# Patient Record
Sex: Male | Born: 1950 | Race: White | Hispanic: No | Marital: Married | Smoking: Never smoker
Health system: Southern US, Community
[De-identification: ages and names within clinical notes are randomized; demographics above are authoritative.]

## PROBLEM LIST (undated history)

## (undated) DIAGNOSIS — C412 Malignant neoplasm of vertebral column: Secondary | ICD-10-CM

## (undated) DIAGNOSIS — G4730 Sleep apnea, unspecified: Secondary | ICD-10-CM

## (undated) DIAGNOSIS — R7303 Prediabetes: Secondary | ICD-10-CM

## (undated) DIAGNOSIS — N189 Chronic kidney disease, unspecified: Secondary | ICD-10-CM

## (undated) DIAGNOSIS — C61 Malignant neoplasm of prostate: Secondary | ICD-10-CM

## (undated) HISTORY — PX: HERNIA REPAIR: SHX51

## (undated) HISTORY — DX: Chronic kidney disease, unspecified: N18.9

## (undated) HISTORY — PX: LITHOTRIPSY: SUR834

## (undated) HISTORY — DX: Prediabetes: R73.03

## (undated) HISTORY — PX: PROSTATE BIOPSY: SHX241

## (undated) HISTORY — DX: Malignant neoplasm of vertebral column: C41.2

---

## 1998-07-18 ENCOUNTER — Ambulatory Visit: Admission: RE | Admit: 1998-07-18 | Discharge: 1998-07-18 | Payer: Self-pay | Admitting: Emergency Medicine

## 2004-10-15 ENCOUNTER — Emergency Department (HOSPITAL_COMMUNITY): Admission: EM | Admit: 2004-10-15 | Discharge: 2004-10-15 | Payer: Self-pay | Admitting: Family Medicine

## 2009-02-22 ENCOUNTER — Ambulatory Visit: Payer: Self-pay | Admitting: Family Medicine

## 2009-11-21 ENCOUNTER — Ambulatory Visit: Payer: Self-pay | Admitting: Family Medicine

## 2009-12-08 ENCOUNTER — Ambulatory Visit: Payer: Self-pay | Admitting: Family Medicine

## 2009-12-19 ENCOUNTER — Ambulatory Visit (HOSPITAL_COMMUNITY): Admission: RE | Admit: 2009-12-19 | Discharge: 2009-12-19 | Payer: Self-pay | Admitting: Urology

## 2010-05-01 ENCOUNTER — Ambulatory Visit: Payer: Self-pay | Admitting: Family Medicine

## 2010-07-04 ENCOUNTER — Ambulatory Visit (INDEPENDENT_AMBULATORY_CARE_PROVIDER_SITE_OTHER): Payer: Medicare FFS | Admitting: Physician Assistant

## 2010-07-04 DIAGNOSIS — M25569 Pain in unspecified knee: Secondary | ICD-10-CM

## 2012-03-13 ENCOUNTER — Ambulatory Visit (INDEPENDENT_AMBULATORY_CARE_PROVIDER_SITE_OTHER): Payer: 59 | Admitting: Family Medicine

## 2012-03-13 ENCOUNTER — Ambulatory Visit
Admission: RE | Admit: 2012-03-13 | Discharge: 2012-03-13 | Disposition: A | Discharge status: Home / Self Care | Payer: 59 | Source: Ambulatory Visit | Attending: Family Medicine | Admitting: Family Medicine

## 2012-03-13 ENCOUNTER — Encounter: Payer: Self-pay | Admitting: Family Medicine

## 2012-03-13 VITALS — BP 112/78 | HR 72 | Wt 204.0 lb

## 2012-03-13 DIAGNOSIS — M25519 Pain in unspecified shoulder: Secondary | ICD-10-CM

## 2012-03-13 DIAGNOSIS — M25511 Pain in right shoulder: Secondary | ICD-10-CM

## 2012-03-13 DIAGNOSIS — Z0000 Encounter for general adult medical examination without abnormal findings: Secondary | ICD-10-CM

## 2012-03-13 LAB — CBC WITH DIFFERENTIAL/PLATELET
Basophils Absolute: 0 10*3/uL (ref 0.0–0.1)
Basophils Relative: 0 % (ref 0–1)
Eosinophils Absolute: 0 10*3/uL (ref 0.0–0.7)
Eosinophils Relative: 1 % (ref 0–5)
HCT: 45.3 % (ref 39.0–52.0)
Hemoglobin: 15.8 g/dL (ref 13.0–17.0)
Lymphocytes Relative: 35 % (ref 12–46)
Lymphs Abs: 1.7 10*3/uL (ref 0.7–4.0)
MCH: 31 pg (ref 26.0–34.0)
MCHC: 34.9 g/dL (ref 30.0–36.0)
MCV: 89 fL (ref 78.0–100.0)
Monocytes Absolute: 0.4 10*3/uL (ref 0.1–1.0)
Monocytes Relative: 8 % (ref 3–12)
Neutro Abs: 2.7 10*3/uL (ref 1.7–7.7)
Neutrophils Relative %: 56 % (ref 43–77)
Platelets: 247 10*3/uL (ref 150–400)
RBC: 5.09 MIL/uL (ref 4.22–5.81)
RDW: 14.2 % (ref 11.5–15.5)
WBC: 4.9 10*3/uL (ref 4.0–10.5)

## 2012-03-13 LAB — COMPREHENSIVE METABOLIC PANEL
ALT: 26 U/L (ref 0–53)
AST: 18 U/L (ref 0–37)
Albumin: 4.3 g/dL (ref 3.5–5.2)
Alkaline Phosphatase: 61 U/L (ref 39–117)
BUN: 14 mg/dL (ref 6–23)
CO2: 26 mEq/L (ref 19–32)
Calcium: 9.3 mg/dL (ref 8.4–10.5)
Chloride: 102 mEq/L (ref 96–112)
Creat: 0.81 mg/dL (ref 0.50–1.35)
Glucose, Bld: 99 mg/dL (ref 70–99)
Potassium: 4.3 mEq/L (ref 3.5–5.3)
Sodium: 139 mEq/L (ref 135–145)
Total Bilirubin: 0.6 mg/dL (ref 0.3–1.2)
Total Protein: 6.6 g/dL (ref 6.0–8.3)

## 2012-03-13 LAB — LIPID PANEL
Cholesterol: 220 mg/dL — ABNORMAL HIGH (ref 0–200)
HDL: 50 mg/dL (ref >39)
LDL Cholesterol: 152 mg/dL — ABNORMAL HIGH (ref 0–99)
Total CHOL/HDL Ratio: 4.4 Ratio
Triglycerides: 88 mg/dL (ref <150)
VLDL: 18 mg/dL (ref 0–40)

## 2012-03-13 MED ORDER — TRAMADOL HCL 50 MG PO TABS: 50.0000 mg | ORAL_TABLET | Freq: Four times a day (QID) | ORAL | Status: DC | PRN

## 2012-03-13 NOTE — Progress Notes (Signed)
  Subjective:    Patient ID: Wesley White, male    DOB: 1950-09-02, 61 y.o.   MRN: [id number]  HPI Approximately 9 days ago while hiking, he fell landing on his right elbow with feeling a crack in his right shoulder and he has had pain since then. No numbness or tingling but definitely pain with motion. He has tried tramadol which has helped with the discomfort. He would also like blood work drawn prior to his complete exam which will be in the near future.   Review of Systems     Objective:   Physical Exam Good motion of the shoulder joint with pain on abduction however passive motion causes very low difficulty. Does have tenderness palpation over the distal clavicle and a.c. area. Normal strength is noted. X-rays of the shoulder and clavicle were negative       Assessment & Plan:  Probable right shoulder a.c. strain. Routine blood work was drawn. Recommend conservative care for this. I recommend that he take 2 tramadol and if he needs a refill, call me

## 2012-03-13 NOTE — Patient Instructions (Signed)
Take 2 tramadol at a time and call when you need a refill

## 2012-03-17 ENCOUNTER — Ambulatory Visit (INDEPENDENT_AMBULATORY_CARE_PROVIDER_SITE_OTHER): Payer: 59 | Admitting: Family Medicine

## 2012-03-17 ENCOUNTER — Encounter: Payer: Self-pay | Admitting: Family Medicine

## 2012-03-17 VITALS — BP 122/84 | HR 84 | Ht 68.0 in | Wt 207.0 lb

## 2012-03-17 DIAGNOSIS — M25519 Pain in unspecified shoulder: Secondary | ICD-10-CM

## 2012-03-17 DIAGNOSIS — Z0000 Encounter for general adult medical examination without abnormal findings: Secondary | ICD-10-CM

## 2012-03-17 DIAGNOSIS — E669 Obesity, unspecified: Secondary | ICD-10-CM

## 2012-03-17 DIAGNOSIS — Z23 Encounter for immunization: Secondary | ICD-10-CM

## 2012-03-17 DIAGNOSIS — M25511 Pain in right shoulder: Secondary | ICD-10-CM

## 2012-03-17 DIAGNOSIS — Z8042 Family history of malignant neoplasm of prostate: Secondary | ICD-10-CM

## 2012-03-17 MED ORDER — TRAMADOL HCL 50 MG PO TABS: 50.0000 mg | ORAL_TABLET | Freq: Four times a day (QID) | ORAL | Status: DC | PRN

## 2012-03-17 NOTE — Progress Notes (Signed)
  Subjective:    Patient ID: Wesley White, male    DOB: Feb 11, 1951, 61 y.o.   MRN: [id number]  HPI He is here for complete examination. He still is having some difficulty with right shoulder pain and would like a renewal of his pain medication. He has no other concerns or complaints. Review of his past medical history is essentially negative. Social and family history were reviewed. His marriage and work are going quite well. He does need followup on immunizations as well as colonoscopy. Family history is significant for brother prior to age 103 having prostate cancer and it sounds as if he had the robotic surgery.   Review of Systems  Constitutional: Negative.   HENT: Negative.   Eyes: Negative.   Respiratory: Negative.   Cardiovascular: Negative.   Gastrointestinal: Negative.   Genitourinary: Negative.   Musculoskeletal: Negative.   Skin: Negative.   Neurological: Negative.   Hematological: Negative.   Psychiatric/Behavioral: Negative.        Objective:   Physical Exam BP 122/84  Pulse 84  Ht 5\' 8"  (1.727 m)  Wt 207 lb (93.895 kg)  BMI 31.47 kg/m2  General Appearance:    Alert, cooperative, no distress, appears stated age  Head:    Normocephalic, without obvious abnormality, atraumatic  Eyes:    PERRL, conjunctiva/corneas clear, EOM's intact, fundi    benign  Ears:    Normal TM's and external ear canals  Nose:   Nares normal, mucosa normal, no drainage or sinus   tenderness  Throat:   Lips, mucosa, and tongue normal; teeth and gums normal  Neck:   Supple, no lymphadenopathy;  thyroid:  no   enlargement/tenderness/nodules; no carotid   bruit or JVD  Back:    Spine nontender, no curvature, ROM normal, no CVA     tenderness  Lungs:     Clear to auscultation bilaterally without wheezes, rales or     ronchi; respirations unlabored  Chest Wall:    No tenderness or deformity   Heart:    Regular rate and rhythm, S1 and S2 normal, no murmur, rub   or gallop  Breast Exam:    No  chest wall tenderness, masses or gynecomastia  Abdomen:     Soft, non-tender, nondistended, normoactive bowel sounds,    no masses, no hepatosplenomegaly  Genitalia:   normal penis and testes   Rectal:   deferred   Extremities:   No clubbing, cyanosis or edema  Pulses:   2+ and symmetric all extremities  Skin:   Skin color, texture, turgor normal, no rashes or lesions  Lymph nodes:   Cervical, supraclavicular, and axillary nodes normal  Neurologic:   CNII-XII intact, normal strength, sensation and gait; reflexes 2+ and symmetric throughout          Psych:   Normal mood, affect, hygiene and grooming.      Assessment & Plan:   1. Right shoulder pain  traMADol (ULTRAM) 50 MG tablet  2. Routine general medical examination at a health care facility  Tdap vaccine greater than or equal to 7yo IM, Varicella-zoster vaccine subcutaneous, HM COLONOSCOPY, PSA, Medicare  3. Family history of prostate cancer    4. Obesity (BMI 30.0-34.9)     discussed weight reduction with him in regard to decreasing carbohydrates also mentioned decreasing his waist size by 2-3 inches.

## 2012-03-17 NOTE — Patient Instructions (Signed)
Decrease carbohydrates by cutting back on "white food".

## 2012-03-18 LAB — PSA, MEDICARE: PSA: 1.64 ng/mL (ref <=4.00)

## 2012-03-18 NOTE — Progress Notes (Signed)
Quick Note:  The blood work is normal ______ 

## 2012-03-18 NOTE — Progress Notes (Signed)
Quick Note:  CALLED PT CELL # PT INFORMED LABS ARE NORMAL PT VERBALIZED UNDERSTANDING  ______

## 2012-04-03 ENCOUNTER — Ambulatory Visit (AMBULATORY_SURGERY_CENTER): Payer: 59 | Admitting: *Deleted

## 2012-04-03 VITALS — Ht 70.0 in | Wt 204.7 lb

## 2012-04-03 DIAGNOSIS — Z1211 Encounter for screening for malignant neoplasm of colon: Secondary | ICD-10-CM

## 2012-04-03 MED ORDER — PEG-KCL-NACL-NASULF-NA ASC-C 100 G PO SOLR: ORAL | Status: DC

## 2012-04-03 NOTE — Progress Notes (Signed)
No allergies to eggs or soy products 

## 2012-04-17 ENCOUNTER — Encounter: Payer: Self-pay | Admitting: Internal Medicine

## 2012-04-17 ENCOUNTER — Ambulatory Visit (AMBULATORY_SURGERY_CENTER): Payer: 59 | Admitting: Internal Medicine

## 2012-04-17 VITALS — BP 137/93 | HR 66 | Temp 97.4°F | Resp 15 | Ht 70.0 in | Wt 204.0 lb

## 2012-04-17 DIAGNOSIS — Z1211 Encounter for screening for malignant neoplasm of colon: Secondary | ICD-10-CM

## 2012-04-17 DIAGNOSIS — D126 Benign neoplasm of colon, unspecified: Secondary | ICD-10-CM

## 2012-04-17 MED ORDER — FLEET ENEMA 7-19 GM/118ML RE ENEM
1.0000 | ENEMA | Freq: Once | RECTAL | Status: AC
  Administered 2012-04-17: 1 via RECTAL

## 2012-04-17 MED ORDER — SODIUM CHLORIDE 0.9 % IV SOLN: 500.0000 mL | INTRAVENOUS | Status: DC

## 2012-04-17 NOTE — Progress Notes (Signed)
PATIENT REPORTING BROWN RESULTS WITH LAST BOWEL MOVEMENT. INFORMED DR. PYRTLE, FLEETS ORDERED. INFORMED PATIENT, WHO RESPONDED, "YOU HAVE GOT TO BE SHITTING ME." INSTRUCTED PATIENT AND OFFERED TO ADMINISTER THE ENEMA, PATIENT SELF ADMINISTERED, AND RETURNED TO BEDSPACE IN LESS THAN 5 MINUTES. SUZANNE HODGES RN REPORTING THAT THE RESULTS WERE CLOUDY WITH PARTICLES, NO LONGER BROWN IN COLOR. INFORMED THE PATIENT THAT HE MAY NEED TO GO BACK TO BATHROOM SHORTLY FROM RESULTS OF ENEMA. PATIENT RESPONDED"OR MAYBE I WILL JUST GO HOME."QUESTIONED PATIENT HE THEN STATED THE PREP HAD BEEN DIFFICULT LAST EVENING. PATIENT STATING HE REALLY DIDN'T KNOW WHY HE WAS HERE. DISCUSSED SCREENING COLONOSCOPY. PATIENT THEN BECAME CALM AND COOPERATIVE WITH ADMITTING PROCESS

## 2012-04-17 NOTE — Progress Notes (Addendum)
Patient swearing at me due to the fact that I asked him to pass gas.    He continues to tell his wife that " there isn't any point in trying to pass anything if there isn't anything there in the first place."  He continues to be argumentative.  After discharge instructions, patient continued to argue the fact about gas, and stated that "he is fine.Marland KitchenMarland Kitchenperiod."     Patient refused to pass gas, and is no on his back and refuses to be on his L side.  He continues to refuse our instructions and tells his wife that "the instructions are stupid."    Patient states that "their expectations are ridiculous."  Patient refused to sit in wheelchair until another nurse told him that he had to or else he couldn't leave.  He sat in the chair, but then got up to put on his coat.  We will not be legally held for him if he falls.   He WILL NOT FOLLOW OR DIRECTIONS.      Patient did not have preoperative order for IV antibiotic SSI prophylaxis. [phone])  Patient did not experience any of the following events: a burn prior to discharge; a fall within the facility; wrong site/side/patient/procedure/implant event; or a hospital transfer or hospital admission upon discharge from the facility. [phone])    .

## 2012-04-17 NOTE — Patient Instructions (Addendum)
YOU HAD AN ENDOSCOPIC PROCEDURE TODAY AT THE [facility]: Refer to the procedure report that was given to you for any specific questions about what was found during the examination.  If the procedure report does not answer your questions, please call your gastroenterologist to clarify.  If you requested that your care partner not be given the details of your procedure findings, then the procedure report has been included in a sealed envelope for you to review at your convenience later.  YOU SHOULD EXPECT: Some feelings of bloating in the abdomen. Passage of more gas than usual.  Walking can help get rid of the air that was put into your GI tract during the procedure and reduce the bloating. If you had a lower endoscopy (such as a colonoscopy or flexible sigmoidoscopy) you may notice spotting of blood in your stool or on the toilet paper. If you underwent a bowel prep for your procedure, then you may not have a normal bowel movement for a few days.  DIET: Your first meal following the procedure should be a light meal and then it is ok to progress to your normal diet.  A half-sandwich or bowl of soup is an example of a good first meal.  Heavy or fried foods are harder to digest and may make you feel nauseous or bloated.  Likewise meals heavy in dairy and vegetables can cause extra gas to form and this can also increase the bloating.  Drink plenty of fluids but you should avoid alcoholic beverages for 24 hours.  ACTIVITY: Your care partner should take you home directly after the procedure.  You should plan to take it easy, moving slowly for the rest of the day.  You can resume normal activity the day after the procedure however you should NOT DRIVE or use heavy machinery for 24 hours (because of the sedation medicines used during the test).    SYMPTOMS TO REPORT IMMEDIATELY: A gastroenterologist can be reached at any hour.  During normal business hours, 8:30 AM to 5:00 PM Monday through Friday,  call [phone].  After hours and on weekends, please call the GI answering service at [phone] who will take a message and have the physician on call contact you.   Following lower endoscopy (colonoscopy or flexible sigmoidoscopy):  Excessive amounts of blood in the stool  Significant tenderness or worsening of abdominal pains  Swelling of the abdomen that is new, acute  Fever of 100F or higher    FOLLOW UP: If any biopsies were taken you will be contacted by phone or by letter within the next 1-3 weeks.  Call your gastroenterologist if you have not heard about the biopsies in 3 weeks.  Our staff will call the home number listed on your records the next business day following your procedure to check on you and address any questions or concerns that you may have at that time regarding the information given to you following your procedure. This is a courtesy call and so if there is no answer at the home number and we have not heard from you through the emergency physician on call, we will assume that you have returned to your regular daily activities without incident.  SIGNATURES/CONFIDENTIALITY: You and/or your care partner have signed paperwork which will be entered into your electronic medical record.  These signatures attest to the fact that that the information above on your After Visit Summary has been reviewed and is understood.  Full responsibility of the confidentiality   of this discharge information lies with you and/or your care-partner.     

## 2012-04-17 NOTE — Op Note (Signed)
[facility] [address] [place], [address]   COLONOSCOPY PROCEDURE REPORT  PATIENT: Wesley White, Wesley White  MR#: [id number] BIRTHDATE: 02-05-1951 , 61  yrs. old GENDER: Male ENDOSCOPIST: Beverley Fiedler, MD REFERRED [id number], John PROCEDURE DATE:  04/17/2012 PROCEDURE:   Colonoscopy with biopsy ASA CLASS:   Class II INDICATIONS:average risk screening and first colonoscopy. MEDICATIONS: MAC sedation, administered by CRNA and propofol (Diprivan) 300mg  IV  DESCRIPTION OF PROCEDURE:   After the risks benefits and alternatives of the procedure were thoroughly explained, informed consent was obtained.  A digital rectal exam revealed no rectal mass.   The LB CF-[id number]  endoscope was introduced through the anus and advanced to the terminal ileum which was intubated for a short distance. No adverse events experienced.   The quality of the prep was Moviprep fair  The instrument was then slowly withdrawn as the colon was fully examined.    COLON FINDINGS: The mucosa appeared normal in the terminal ileum. Mild erythema was found at the ileocecal valve on the cecal side raising the question of adenomatous change.  Multiple biopsies of the area were performed using cold forceps.   The preparation was fair requiring copious irrigation and lavage, and the colon mucosa was otherwise normal.  Retroflexed views revealed no abnormalities. The time to cecum=4 minutes 48 seconds.  Withdrawal time=11 minutes 12 seconds.  The scope was withdrawn and the procedure completed.  COMPLICATIONS: There were no complications.  ENDOSCOPIC IMPRESSION: 1.   Normal mucosa in the terminal ileum 2.   Mild erythema was found at the ileocecal valve; multiple biopsies of the area were performed using cold forceps 3.   The colon mucosa was otherwise normal  RECOMMENDATIONS: 1.  Await biopsy results 2.  Timing of repeat colonoscopy will be determined by pathology findings. 3.  You will  receive a letter within 1-2 weeks with the results of your biopsy as well as final recommendations.  Please call my office if you have not received a letter after 3 weeks.   eSigned:  Beverley Fiedler, MD 04/17/2012 12:19 PM  cc: Sharlot Gowda, MD and The Patient

## 2012-04-17 NOTE — Progress Notes (Signed)
The pt tolerated the colonoscopy very well. Maw   

## 2012-04-18 ENCOUNTER — Telehealth: Payer: Self-pay | Admitting: *Deleted

## 2012-04-18 NOTE — Telephone Encounter (Signed)
  Follow up Call-  Call back number 04/17/2012  Post procedure Call Back phone  # [phone]  Permission to leave phone message Yes     Patient questions:  Do you have a fever, pain , or abdominal swelling? no Pain Score  0 *  Have you tolerated food without any problems? yes  Have you been able to return to your normal activities? yes  Do you have any questions about your discharge instructions: Diet   no Medications  no Follow up visit  no  Do you have questions or concerns about your Care? no  Actions: * If pain score is 4 or above: No action needed, pain <4.

## 2012-04-23 ENCOUNTER — Encounter: Payer: Self-pay | Admitting: Internal Medicine

## 2013-05-19 ENCOUNTER — Ambulatory Visit (INDEPENDENT_AMBULATORY_CARE_PROVIDER_SITE_OTHER): Payer: 59 | Admitting: Family Medicine

## 2013-05-19 ENCOUNTER — Encounter: Payer: Self-pay | Admitting: Family Medicine

## 2013-05-19 VITALS — BP 120/74 | HR 100 | Temp 98.6°F | Wt 203.0 lb

## 2013-05-19 DIAGNOSIS — J069 Acute upper respiratory infection, unspecified: Secondary | ICD-10-CM

## 2013-05-19 NOTE — Patient Instructions (Signed)

## 2013-05-19 NOTE — Progress Notes (Signed)
   Subjective:    Patient ID: Wesley White, male    DOB: [birth year], 62 y.o.   MRN: [id number]  HPI 4 days ago he started having difficulty with malaise, nasal congestion followed by chest congestion and cough and slight fever. No sore throat or earache. He does not smoke and has no allergies.   Review of Systems     Objective:   Physical Exam alert and in no distress. Tympanic membranes and canals are normal. Throat is clear. Tonsils are normal. Neck is supple without adenopathy or thyromegaly. Cardiac exam shows a regular sinus rhythm without murmurs or gallops. Lungs are clear to auscultation.        Assessment & Plan:  Acute URI  recommend supportive care.

## 2014-10-26 ENCOUNTER — Ambulatory Visit (INDEPENDENT_AMBULATORY_CARE_PROVIDER_SITE_OTHER): Payer: 59 | Admitting: Family Medicine

## 2014-10-26 VITALS — BP 132/84 | HR 115 | Temp 99.9°F | Wt 223.0 lb

## 2014-10-26 DIAGNOSIS — M79661 Pain in right lower leg: Secondary | ICD-10-CM | POA: Diagnosis not present

## 2014-10-26 DIAGNOSIS — J069 Acute upper respiratory infection, unspecified: Secondary | ICD-10-CM | POA: Diagnosis not present

## 2014-10-26 NOTE — Progress Notes (Signed)
   Subjective:    Patient ID: Wesley White, male    DOB: [birth year], 64 y.o.   MRN: [id number]  HPI He complains of a 4 day history this started with rhinorrhea, coughs, sore throat and now hoarse voice. No fever, chills, earache, shortness of breath. He does have some slight chest discomfort when he coughs. He also complains of a 4 day history of right lateral calf pain. No history of injury to it is been no shortness of breath, chest pain, history of sitting for long periods of time. He states that the leg pain is now much better.   Review of Systems     Objective:   Physical Exam Alert and in no distress. Tympanic membranes and canals are normal. Pharyngeal area is normal. Neck is supple without adenopathy or thyromegaly. Cardiac exam shows a regular sinus rhythm without murmurs or gallops. Lungs are clear to auscultation.Exam of the right calf shows a negative Homans sign. No visible lesions. No palpable tenderness or lesions. Normal skin.        Assessment & Plan:  Right calf pain  Acute URI Recommend supportive care for the URI. Explained that the calf pain since it is gone, no further intervention necessary.

## 2014-11-24 ENCOUNTER — Telehealth: Payer: Self-pay | Admitting: Internal Medicine

## 2014-11-24 MED ORDER — AMOXICILLIN 875 MG PO TABS: 875.0000 mg | ORAL_TABLET | Freq: Two times a day (BID) | ORAL | Status: DC

## 2014-11-24 NOTE — Telephone Encounter (Signed)
Pt wife called stating that pt is coughing up green stuff still. He was seen here a few weeks ago. He is having a sore throat as well. No fever, no energy. Can you send something in to [facility] or does he need to be seen

## 2014-11-24 NOTE — Telephone Encounter (Signed)
Let them know that I called and antibody can. If still having difficulty when he finishes anabiotic, have him make an appointment

## 2014-11-24 NOTE — Telephone Encounter (Signed)
Pt informed and verbalized understanding

## 2015-04-04 ENCOUNTER — Encounter: Payer: Self-pay | Admitting: Family Medicine

## 2015-04-04 ENCOUNTER — Ambulatory Visit (INDEPENDENT_AMBULATORY_CARE_PROVIDER_SITE_OTHER): Payer: 59 | Admitting: Family Medicine

## 2015-04-04 ENCOUNTER — Telehealth: Payer: Self-pay | Admitting: Internal Medicine

## 2015-04-04 VITALS — BP 118/76 | HR 88 | Temp 98.3°F | Wt 220.6 lb

## 2015-04-04 DIAGNOSIS — H109 Unspecified conjunctivitis: Secondary | ICD-10-CM

## 2015-04-04 DIAGNOSIS — G4733 Obstructive sleep apnea (adult) (pediatric): Secondary | ICD-10-CM | POA: Diagnosis not present

## 2015-04-04 DIAGNOSIS — J069 Acute upper respiratory infection, unspecified: Secondary | ICD-10-CM | POA: Diagnosis not present

## 2015-04-04 DIAGNOSIS — Z9989 Dependence on other enabling machines and devices: Secondary | ICD-10-CM | POA: Insufficient documentation

## 2015-04-04 MED ORDER — BUDESONIDE 32 MCG/ACT NA SUSP: 2.0000 | Freq: Every day | NASAL | Status: DC

## 2015-04-04 MED ORDER — ERYTHROMYCIN 5 MG/GM OP OINT: 1.0000 "application " | TOPICAL_OINTMENT | Freq: Three times a day (TID) | OPHTHALMIC | Status: DC

## 2015-04-04 NOTE — Telephone Encounter (Signed)
Pt was seen today and asked about getting some cpap supplies. I can not find sleep study in chart and have called [facility] and they do not have record of pt there either. I have called [facility] and spoke to dwayne and they have an overnight oximetry that they are faxing over and i will see if i can fax that over with rx for cpap supplies. (hose,tubing, and mask).  Phone # [phone] Fax # [phone]

## 2015-04-04 NOTE — Telephone Encounter (Signed)
[facility]. So i have faxed over rx and sleep study for pt to get some supplies

## 2015-04-04 NOTE — Progress Notes (Signed)
Subjective:    Patient ID: Wesley White, male    DOB: [birth year], 64 y.o.   MRN: [id number]  HPI Chief Complaint  Patient presents with  . URI    upper respirtory issue. cough, eyes matting shut. sore throat since last tuesday. some symptoms have gotten better   . sleep supplies    needs hose, mask, tubing through [facility].    He is here for a 3 day history of bilateral eyes being matted together with green discharge when he wakes up and having red eyes with clear drainage throughout the day. Reports some blurred vision with drainage but no changes otherwise. Denies foreign body sensation or pain to eyes. He wears eyeglasses.  States last week he had been achy with mild sore throat, ears and and nose itching and mild cough and states those symptoms are much improved. Denies history of lung disease, does not smoke, no known sick contacts.  He is also requesting that we help him order new supplies for his CPAP.  He states he has not received new supplies and 3 and half years.  Reports nightly use without any difficulties. Denies fever, chills, headache, dizziness, chest pain, shortness of breath, GI or GU symptoms.   Reviewed allergies, medications , past medical , and social history.   Review of Systems Pertinent positives and negatives in the history of present illness.    Objective:   Physical Exam  Constitutional: He appears well-developed and well-nourished. No distress.  HENT:  Right Ear: Tympanic membrane and ear canal normal.  Left Ear: Tympanic membrane and ear canal normal.  Nose: Nose normal. Right sinus exhibits no maxillary sinus tenderness and no frontal sinus tenderness. Left sinus exhibits no maxillary sinus tenderness and no frontal sinus tenderness.  Mouth/Throat: Uvula is midline and mucous membranes are normal. Posterior oropharyngeal erythema present. No oropharyngeal exudate or posterior oropharyngeal edema.  Eyes: EOM and lids are normal. Pupils are equal, round, and  reactive to light.  Bilateral conjunctiva with inflammation and clear drainage  Neck: Normal range of motion and full passive range of motion without pain. Neck supple.  Cardiovascular: Normal rate, regular rhythm and normal heart sounds.   Pulmonary/Chest: Effort normal and breath sounds normal. He has no wheezes. He has no rhonchi.  Lymphadenopathy:    He has no cervical adenopathy.       Right: No supraclavicular adenopathy present.       Left: No supraclavicular adenopathy present.  Skin: Skin is warm and dry. No cyanosis. No pallor. Nails show no clubbing.  BP 118/76 mmHg  Pulse 88  Temp(Src) 98.3 F (36.8 C) (Oral)  Wt 220 lb 9.6 oz (100.064 kg)  SpO2 98%   Visual acuity: normal    Assessment & Plan:  Bilateral conjunctivitis - Plan: erythromycin (ROMYCIN) ophthalmic ointment  OSA on CPAP  Viral URI  Viral URI- Discussed that he should continue symptomatic treatment for upper respiratory symptoms and that this is most likely viral and that he may have some underlying allergies. Samples of Rhinocort and Zyrtec given along with instructions for use.  He will let me know if this is not helping.  Bilateral conjunctivitis- Suspect bacterial conjunctivitis and will treat with antibiotic. Instructions given on how to use ophthalmic ointment and on using good hygiene to prevent spread of infection to others.    Sleep Apnea- He has a history of obstructive sleep apnea and uses CPAP nightly.  He is requesting new supplies for his machine and Sabrina, CMA,  is trying to get his previous sleep study results in order to get new supplies for him. Records show that his previous sleep study was at [facility] in 2000. She is working with [employer] in order to get his new supplies.

## 2015-04-06 ENCOUNTER — Telehealth: Payer: Self-pay | Admitting: Family Medicine

## 2015-04-06 NOTE — Telephone Encounter (Signed)
Please let him know that he can use Clear Eyes or something similar during the day and use the antibiotic in the evening.

## 2015-04-06 NOTE — Telephone Encounter (Signed)
Please call  Re: ointment for eye, it is helping but he can use during the days because it makes his vision blurry and interferes with him driving, Can he change to eye drops?  [facility] [address]   Please call

## 2015-04-06 NOTE — Telephone Encounter (Signed)
Patient notified

## 2015-05-27 ENCOUNTER — Other Ambulatory Visit: Payer: Self-pay | Admitting: Family Medicine

## 2015-05-27 ENCOUNTER — Encounter: Payer: Self-pay | Admitting: Family Medicine

## 2015-05-27 ENCOUNTER — Ambulatory Visit (INDEPENDENT_AMBULATORY_CARE_PROVIDER_SITE_OTHER): Payer: 59 | Admitting: Family Medicine

## 2015-05-27 VITALS — BP 130/78 | HR 64 | Temp 98.5°F | Wt 223.4 lb

## 2015-05-27 DIAGNOSIS — Z87442 Personal history of urinary calculi: Secondary | ICD-10-CM | POA: Diagnosis not present

## 2015-05-27 DIAGNOSIS — R109 Unspecified abdominal pain: Secondary | ICD-10-CM | POA: Diagnosis not present

## 2015-05-27 DIAGNOSIS — R7303 Prediabetes: Secondary | ICD-10-CM

## 2015-05-27 DIAGNOSIS — R81 Glycosuria: Secondary | ICD-10-CM | POA: Diagnosis not present

## 2015-05-27 DIAGNOSIS — S99912A Unspecified injury of left ankle, initial encounter: Secondary | ICD-10-CM

## 2015-05-27 LAB — POCT URINALYSIS DIPSTICK
Bilirubin, UA: NEGATIVE
Blood, UA: NEGATIVE
Ketones, UA: NEGATIVE
Leukocytes, UA: NEGATIVE
Nitrite, UA: NEGATIVE
Protein, UA: NEGATIVE
Spec Grav, UA: >=1.03
Urobilinogen, UA: NEGATIVE
pH, UA: 5.5

## 2015-05-27 LAB — POCT GLYCOSYLATED HEMOGLOBIN (HGB A1C): Hemoglobin A1C: 6

## 2015-05-27 MED ORDER — IBUPROFEN 800 MG PO TABS: 800.0000 mg | ORAL_TABLET | Freq: Three times a day (TID) | ORAL | Status: DC | PRN

## 2015-05-27 NOTE — Patient Instructions (Addendum)
Try taking 800 mg ibuprofen 3 times a day with food for the next 10 days and see if this helps with your side pain.  Prediabetes Eating Plan Prediabetes--also called impaired glucose tolerance or impaired fasting glucose--is a condition that causes blood sugar (blood glucose) levels to be higher than normal. Following a healthy diet can help to keep prediabetes under control. It can also help to lower the risk of type 2 diabetes and heart disease, which are increased in people who have prediabetes. Along with regular exercise, a healthy diet:  Promotes weight loss.  Helps to control blood sugar levels.  Helps to improve the way that the body uses insulin. WHAT DO I NEED TO KNOW ABOUT THIS EATING PLAN?  Use the glycemic index (GI) to plan your meals. The index tells you how quickly a food will raise your blood sugar. Choose low-GI foods. These foods take a longer time to raise blood sugar.  Pay close attention to the amount of carbohydrates in the food that you eat. Carbohydrates increase blood sugar levels.  Keep track of how many calories you take in. Eating the right amount of calories will help you to achieve a healthy weight. Losing about 7 percent of your starting weight can help to prevent type 2 diabetes.  You may want to follow a Mediterranean diet. This diet includes a lot of vegetables, lean meats or fish, whole grains, fruits, and healthy oils and fats. WHAT FOODS CAN I EAT? Grains Whole grains, such as whole-wheat or whole-grain breads, crackers, cereals, and pasta. Unsweetened oatmeal. Bulgur. Barley. Quinoa. Brown rice. Corn or whole-wheat flour tortillas or taco shells. Vegetables Lettuce. Spinach. Peas. Beets. Cauliflower. Cabbage. Broccoli. Carrots. Tomatoes. Squash. Eggplant. Herbs. Peppers. Onions. Cucumbers. Brussels sprouts. Fruits Berries. Bananas. Apples. Oranges. Grapes. Papaya. Mango. Pomegranate. Kiwi. Grapefruit. Cherries. Meats and Other Protein Sources Seafood.  Lean meats, such as chicken and [place] or lean cuts of pork and beef. Tofu. Eggs. Nuts. Beans. Dairy Low-fat or fat-free dairy products, such as yogurt, cottage cheese, and cheese. Beverages Water. Tea. Coffee. Sugar-free or diet soda. Seltzer water. Milk. Milk alternatives, such as soy or almond milk. Condiments Mustard. Relish. Low-fat, low-sugar ketchup. Low-fat, low-sugar barbecue sauce. Low-fat or fat-free mayonnaise. Sweets and Desserts Sugar-free or low-fat pudding. Sugar-free or low-fat ice cream and other frozen treats. Fats and Oils Avocado. Walnuts. Olive oil. The items listed above may not be a complete list of recommended foods or beverages. Contact your dietitian for more options.  WHAT FOODS ARE NOT RECOMMENDED? Grains Refined white flour and flour products, such as bread, pasta, snack foods, and cereals. Beverages Sweetened drinks, such as sweet iced tea and soda. Sweets and Desserts Baked goods, such as cake, cupcakes, pastries, cookies, and cheesecake. The items listed above may not be a complete list of foods and beverages to avoid. Contact your dietitian for more information.   This information is not intended to replace advice given to you by your health care provider. Make sure you discuss any questions you have with your health care provider.   Document Released: 09/28/2014 Document Reviewed: 09/28/2014 Elsevier Interactive Patient Education [employer].

## 2015-05-27 NOTE — Progress Notes (Signed)
   Subjective:    Patient ID: Wesley White, male    DOB: 1950/06/26, 64 y.o.   MRN: [id number]  HPI Chief Complaint  Patient presents with  . abdominal pain    abdominal pain for 2 weeks, will hurt when walking or urinating.    He is here with complaints of a 2 week history of intermittent non radiating left side pain that he states initially felt like a "gas pain". He states pain occurs mainly with movement and occasionally when he has a full bladder and is urinating he notices the side pain. He now thinks the pain is a musce issue. States when he sleeps or is sitting still the pain is not present. Bowel movements normal per patient.  No known injury but he states he lifts a lot at work and drives a lot.  Ibuprofen does help. He has only been taking 200-400 mg sporadically.   Denies fever, chills, fatigue, body aches, nausea, vomiting, chest pain, palpitations, DOE, orthopnea, GI or GU issues. Also denies increased thirst or hunger.     Review of Systems Pertinent positives and negatives in the history of present illness.    Objective:   Physical Exam  Constitutional: He appears well-developed and well-nourished. No distress.  Cardiovascular: Normal rate, regular rhythm and normal pulses.   Pulmonary/Chest: Effort normal and breath sounds normal. He exhibits no tenderness.  Abdominal: Soft. Normal appearance and bowel sounds are normal. He exhibits no distension and no ascites. There is no hepatosplenomegaly. There is tenderness in the left lower quadrant. There is no rigidity, no rebound, no guarding and no CVA tenderness.    No rash, erythema, edema, ecchymosis. Tenderness noted to left side below rib and to left lower quadrant.   Genitourinary: Prostate normal. Rectal exam shows no tenderness. Guaiac negative stool. Prostate is not tender.  Skin: Skin is warm and dry. No bruising and no rash noted. No pallor.   BP 130/78 mmHg  Pulse 64  Temp(Src) 98.5 F (36.9 C) (Oral)  Wt 223  lb 6.4 oz (101.334 kg)   Urinalysis dipstick positive for glucose 2+    Assessment & Plan:  Side pain - Plan: POCT urinalysis dipstick, ibuprofen (ADVIL,MOTRIN) 800 MG tablet  History of kidney stones - Plan: POCT urinalysis dipstick  Glucosuria - Plan: POCT glycosylated hemoglobin (Hb A1C)  Prediabetes  Discussed patient with Dr. Redmond School and discussed with patient the possible explanations for his side pain. Pain with movement speaks against kidney stone and more likely a musculoskeletal etiology. No signs of systemic illness or infectious process.  His urine was negative for infection or blood however positive for glucose. No personal history of diabetes however mother with DM2. Hemoglobin A1c 6.0 today. Discussed that he is in the prediabetes range and recommend cutting back on his sugar intake and carbohydrates and making healthier food choices. Written information provided on prediabetes. Discussed diabetes spectrum and that healthy diet and exercise can prolong or possibly keep him from developing diabetes. Will follow up in 10 days on side pain or sooner if his symptoms worsen. Discussed that if he develops fever, vomiting or worsening pain over the weekend that he should go to an urgent care.  Spent a minimum of 25 minutes face-to-face with patient and at least 50% of that was in counseling and coordination of care.

## 2015-06-08 ENCOUNTER — Encounter: Payer: Self-pay | Admitting: Family Medicine

## 2015-06-08 ENCOUNTER — Ambulatory Visit (INDEPENDENT_AMBULATORY_CARE_PROVIDER_SITE_OTHER): Payer: 59 | Admitting: Family Medicine

## 2015-06-08 VITALS — BP 124/72 | HR 64 | Wt 224.4 lb

## 2015-06-08 DIAGNOSIS — G4733 Obstructive sleep apnea (adult) (pediatric): Secondary | ICD-10-CM | POA: Diagnosis not present

## 2015-06-08 DIAGNOSIS — R7309 Other abnormal glucose: Secondary | ICD-10-CM

## 2015-06-08 DIAGNOSIS — R109 Unspecified abdominal pain: Secondary | ICD-10-CM | POA: Diagnosis not present

## 2015-06-08 DIAGNOSIS — Z9989 Dependence on other enabling machines and devices: Secondary | ICD-10-CM

## 2015-06-08 DIAGNOSIS — R81 Glycosuria: Secondary | ICD-10-CM | POA: Diagnosis not present

## 2015-06-08 DIAGNOSIS — R7303 Prediabetes: Secondary | ICD-10-CM

## 2015-06-08 LAB — CBC WITH DIFFERENTIAL/PLATELET
Basophils Absolute: 0 10*3/uL (ref 0.0–0.1)
Basophils Relative: 0 % (ref 0–1)
Eosinophils Absolute: 0.1 10*3/uL (ref 0.0–0.7)
Eosinophils Relative: 1 % (ref 0–5)
HCT: 49.5 % (ref 39.0–52.0)
Hemoglobin: 17.1 g/dL — ABNORMAL HIGH (ref 13.0–17.0)
Lymphocytes Relative: 30 % (ref 12–46)
Lymphs Abs: 1.7 10*3/uL (ref 0.7–4.0)
MCH: 31.2 pg (ref 26.0–34.0)
MCHC: 34.5 g/dL (ref 30.0–36.0)
MCV: 90.3 fL (ref 78.0–100.0)
MPV: 10.3 fL (ref 8.6–12.4)
Monocytes Absolute: 0.5 10*3/uL (ref 0.1–1.0)
Monocytes Relative: 9 % (ref 3–12)
Neutro Abs: 3.3 10*3/uL (ref 1.7–7.7)
Neutrophils Relative %: 60 % (ref 43–77)
Platelets: 230 10*3/uL (ref 150–400)
RBC: 5.48 MIL/uL (ref 4.22–5.81)
RDW: 14.4 % (ref 11.5–15.5)
WBC: 5.5 10*3/uL (ref 4.0–10.5)

## 2015-06-08 LAB — COMPREHENSIVE METABOLIC PANEL
ALT: 87 U/L — ABNORMAL HIGH (ref 9–46)
AST: 38 U/L — ABNORMAL HIGH (ref 10–35)
Albumin: 4.1 g/dL (ref 3.6–5.1)
Alkaline Phosphatase: 70 U/L (ref 40–115)
BUN: 14 mg/dL (ref 7–25)
CO2: 25 mmol/L (ref 20–31)
Calcium: 9.2 mg/dL (ref 8.6–10.3)
Chloride: 103 mmol/L (ref 98–110)
Creat: 0.81 mg/dL (ref 0.70–1.25)
Glucose, Bld: 116 mg/dL — ABNORMAL HIGH (ref 65–99)
Potassium: 4.4 mmol/L (ref 3.5–5.3)
Sodium: 137 mmol/L (ref 135–146)
Total Bilirubin: 0.6 mg/dL (ref 0.2–1.2)
Total Protein: 6.9 g/dL (ref 6.1–8.1)

## 2015-06-08 LAB — LIPID PANEL
Cholesterol: 243 mg/dL — ABNORMAL HIGH (ref 125–200)
HDL: 44 mg/dL (ref >=40)
LDL Cholesterol: 168 mg/dL — ABNORMAL HIGH (ref <130)
Total CHOL/HDL Ratio: 5.5 Ratio — ABNORMAL HIGH (ref <=5.0)
Triglycerides: 153 mg/dL — ABNORMAL HIGH (ref <150)
VLDL: 31 mg/dL — ABNORMAL HIGH (ref <30)

## 2015-06-08 NOTE — Progress Notes (Signed)
   Subjective:    Patient ID: Wesley White, male    DOB: [birth year], 65 y.o.   MRN: [id number]  HPI Chief Complaint  Patient presents with  . follow-up    follow-up on side pain. no pain   He is here for follow-up on left side pain and elevated hemoglobin A1c. Side pain resolved, he states he noticed improvement with ibuprofen and then 2-3 days later it totally went away. He is fasting today. States he has not had blood work or physical in several years.   Would like new supplies for cpap. He uses this nightly for OSA.  States he last got his machine and supplies from [place] on [address].    Review of Systems Pertinent positives and negatives in the history of present illness.    Objective:   Physical Exam BP 124/72 mmHg  Pulse 64  Wt 224 lb 6.4 oz (101.787 kg)  Alert and oriented and in no acute distress. Not otherwise examined.     Assessment & Plan:  Elevated hemoglobin A1c - Plan: Comprehensive metabolic panel, Lipid panel  Prediabetes - Plan: Comprehensive metabolic panel, Lipid panel  OSA on CPAP  Glucosuria  Side pain - Plan: Comprehensive metabolic panel, CBC with Differential/Platelet  He is here for follow up on left side pain and elevated Hemoglobin A1C. He is fasting today so that we can check fasting glucose. Suspicious that his blood sugars recently are high since he was spilling glucose at visit last week. Will also check lipids to assess for cardiac risk factors due to new diagnosis of prediabetes. Gabriel Cirri, CMA is contacting [facility] regarding CPAP and supplies.  Discussed that he is overdue for complete physical examination with Dr. Redmond School.  Also recommend that he ask about shingles vaccine and Tdap when he returns for physical.

## 2015-06-08 NOTE — Patient Instructions (Addendum)
I recommend you schedule an appointment with Dr. Redmond School for a complete physical examination. Records show that you are overdue for this. Based on your new elevated hemoglobin A1C and new diagnosis of prediabetes, I am checking some blood work today including you cholesterol to assess for risk factors for heart disease.   Prediabetes Eating Plan Prediabetes--also called impaired glucose tolerance or impaired fasting glucose--is a condition that causes blood sugar (blood glucose) levels to be higher than normal. Following a healthy diet can help to keep prediabetes under control. It can also help to lower the risk of type 2 diabetes and heart disease, which are increased in people who have prediabetes. Along with regular exercise, a healthy diet:  Promotes weight loss.  Helps to control blood sugar levels.  Helps to improve the way that the body uses insulin. WHAT DO I NEED TO KNOW ABOUT THIS EATING PLAN?  Use the glycemic index (GI) to plan your meals. The index tells you how quickly a food will raise your blood sugar. Choose low-GI foods. These foods take a longer time to raise blood sugar.  Pay close attention to the amount of carbohydrates in the food that you eat. Carbohydrates increase blood sugar levels.  Keep track of how many calories you take in. Eating the right amount of calories will help you to achieve a healthy weight. Losing about 7 percent of your starting weight can help to prevent type 2 diabetes.  You may want to follow a Mediterranean diet. This diet includes a lot of vegetables, lean meats or fish, whole grains, fruits, and healthy oils and fats. WHAT FOODS CAN I EAT? Grains Whole grains, such as whole-wheat or whole-grain breads, crackers, cereals, and pasta. Unsweetened oatmeal. Bulgur. Barley. Quinoa. Brown rice. Corn or whole-wheat flour tortillas or taco shells. Vegetables Lettuce. Spinach. Peas. Beets. Cauliflower. Cabbage. Broccoli. Carrots. Tomatoes. Squash.  Eggplant. Herbs. Peppers. Onions. Cucumbers. Brussels sprouts. Fruits Berries. Bananas. Apples. Oranges. Grapes. Papaya. Mango. Pomegranate. Kiwi. Grapefruit. Cherries. Meats and Other Protein Sources Seafood. Lean meats, such as chicken and [place] or lean cuts of pork and beef. Tofu. Eggs. Nuts. Beans. Dairy Low-fat or fat-free dairy products, such as yogurt, cottage cheese, and cheese. Beverages Water. Tea. Coffee. Sugar-free or diet soda. Seltzer water. Milk. Milk alternatives, such as soy or almond milk. Condiments Mustard. Relish. Low-fat, low-sugar ketchup. Low-fat, low-sugar barbecue sauce. Low-fat or fat-free mayonnaise. Sweets and Desserts Sugar-free or low-fat pudding. Sugar-free or low-fat ice cream and other frozen treats. Fats and Oils Avocado. Walnuts. Olive oil. The items listed above may not be a complete list of recommended foods or beverages. Contact your dietitian for more options.  WHAT FOODS ARE NOT RECOMMENDED? Grains Refined white flour and flour products, such as bread, pasta, snack foods, and cereals. Beverages Sweetened drinks, such as sweet iced tea and soda. Sweets and Desserts Baked goods, such as cake, cupcakes, pastries, cookies, and cheesecake. The items listed above may not be a complete list of foods and beverages to avoid. Contact your dietitian for more information.   This information is not intended to replace advice given to you by your health care provider. Make sure you discuss any questions you have with your health care provider.   Document Released: 09/28/2014 Document Reviewed: 09/28/2014 Elsevier Interactive Patient Education [employer].

## 2015-07-28 ENCOUNTER — Ambulatory Visit (INDEPENDENT_AMBULATORY_CARE_PROVIDER_SITE_OTHER): Payer: 59 | Admitting: Family Medicine

## 2015-07-28 ENCOUNTER — Encounter: Payer: Self-pay | Admitting: Family Medicine

## 2015-07-28 ENCOUNTER — Other Ambulatory Visit: Payer: Self-pay | Admitting: Family Medicine

## 2015-07-28 VITALS — BP 124/76 | HR 64 | Ht 68.75 in | Wt 221.6 lb

## 2015-07-28 DIAGNOSIS — E669 Obesity, unspecified: Secondary | ICD-10-CM

## 2015-07-28 DIAGNOSIS — Z0000 Encounter for general adult medical examination without abnormal findings: Secondary | ICD-10-CM | POA: Diagnosis not present

## 2015-07-28 DIAGNOSIS — Z1159 Encounter for screening for other viral diseases: Secondary | ICD-10-CM | POA: Diagnosis not present

## 2015-07-28 DIAGNOSIS — Z87442 Personal history of urinary calculi: Secondary | ICD-10-CM | POA: Diagnosis not present

## 2015-07-28 DIAGNOSIS — R7303 Prediabetes: Secondary | ICD-10-CM | POA: Diagnosis not present

## 2015-07-28 DIAGNOSIS — G4733 Obstructive sleep apnea (adult) (pediatric): Secondary | ICD-10-CM

## 2015-07-28 DIAGNOSIS — Z8042 Family history of malignant neoplasm of prostate: Secondary | ICD-10-CM | POA: Diagnosis not present

## 2015-07-28 DIAGNOSIS — Z9989 Dependence on other enabling machines and devices: Secondary | ICD-10-CM

## 2015-07-28 LAB — POCT URINALYSIS DIPSTICK
Bilirubin, UA: NEGATIVE
Blood, UA: NEGATIVE
Glucose, UA: NEGATIVE
Ketones, UA: NEGATIVE
Leukocytes, UA: NEGATIVE
Nitrite, UA: NEGATIVE
Protein, UA: NEGATIVE
Spec Grav, UA: >=1.03
Urobilinogen, UA: NEGATIVE
pH, UA: 6

## 2015-07-28 LAB — POCT GLYCOSYLATED HEMOGLOBIN (HGB A1C): Hemoglobin A1C: 6.1

## 2015-07-28 LAB — HEPATITIS C ANTIBODY: HCV Ab: NEGATIVE

## 2015-07-28 NOTE — Patient Instructions (Signed)
Work on getting 20 minutes of physical activity and every day

## 2015-07-28 NOTE — Progress Notes (Signed)
   Subjective:    Patient ID: Wesley White, male    DOB: 07/06/1950, 65 y.o.   MRN: [id number]  HPI He is here for complete examination. He has no particular concerns or complaints. He does have OSA and is on CPAP. He uses this regularly and gets good results with this. He does need new supplies and the machine. He does have family history of prostate cancer. He also has a previous history of prediabetes. His last hemoglobin A1c was 6.0. He also has a previous history of kidney stones but none recently. His marriage is going well. He continues to work and does plan to retire within the next several years. Family and social history as well as health maintenance and immunizations were reviewed he's had no chest pain, shortness breath or GI problems.   Review of Systems  All other systems reviewed and are negative.      Objective:   Physical Exam BP 124/76 mmHg  Pulse 64  Ht 5' 8.75" (1.746 m)  Wt 221 lb 9.6 oz (100.517 kg)  BMI 32.97 kg/m2  General Appearance:    Alert, cooperative, no distress, appears stated age  Head:    Normocephalic, without obvious abnormality, atraumatic  Eyes:    PERRL, conjunctiva/corneas clear, EOM's intact, fundi    benign  Ears:    Normal TM's and external ear canals  Nose:   Nares normal, mucosa normal, no drainage or sinus   tenderness  Throat:   Lips, mucosa, and tongue normal; teeth and gums normal  Neck:   Supple, no lymphadenopathy;  thyroid:  no   enlargement/tenderness/nodules; no carotid   bruit or JVD  Back:    Spine nontender, no curvature, ROM normal, no CVA     tenderness  Lungs:     Clear to auscultation bilaterally without wheezes, rales or     ronchi; respirations unlabored  Chest Wall:    No tenderness or deformity   Heart:    Regular rate and rhythm, S1 and S2 normal, no murmur, rub   or gallop     Abdomen:     Soft, non-tender, nondistended, normoactive bowel sounds,    no masses, no hepatosplenomegaly        Extremities:   No  clubbing, cyanosis or edema  Pulses:   2+ and symmetric all extremities  Skin:   Skin color, texture, turgor normal, no rashes or lesions  Lymph nodes:   Cervical, supraclavicular, and axillary nodes normal  Neurologic:   CNII-XII intact, normal strength, sensation and gait; reflexes 2+ and symmetric throughout          Psych:   Normal mood, affect, hygiene and grooming.    A1c is 6.1      Assessment & Plan:  Routine general medical examination at a health care facility - Plan: POCT urinalysis dipstick  Family history of prostate cancer - Plan: PSA  Prediabetes - Plan: POCT glycosylated hemoglobin (Hb A1C)  OSA on CPAP  Obesity (BMI 30.0-34.9)  History of kidney stones  Need for hepatitis C screening test - Plan: Hepatitis C antibody I again discussed his risk for diabetes in regard to his weight, exercise, risk for other medical problems. Strongly encouraged him to make further diet and exercise changes. We also discussed retirement. He does seem to have a good handle on what he wants to do when he finally retires.

## 2015-07-29 LAB — PSA: PSA: 10.18 ng/mL — ABNORMAL HIGH (ref <=4.00)

## 2015-07-29 NOTE — Progress Notes (Signed)
   Subjective:    Patient ID: Wesley White, male    DOB: 1951/03/26, 65 y.o.   MRN: [id number]  HPI    Review of Systems     Objective:   Physical Exam        Assessment & Plan:  His PSA is elevated. I discussed this with him. Presently he is having no prostate related symptoms that would make me want to put him on an antibiotic. I will place a future order for PSA in 1 month. I will also do a percent PSA on the present specimen.

## 2015-07-29 NOTE — Addendum Note (Signed)
Addended by: [name] on: 07/29/2015 09:10 AM   Modules accepted: Orders

## 2015-07-30 LAB — PSA, TOTAL AND FREE
PSA, Free Pct: 12 % — ABNORMAL LOW (ref >25)
PSA, Free: 1.24 ng/mL
PSA: 10.18 ng/mL — ABNORMAL HIGH (ref <=4.00)

## 2015-08-23 ENCOUNTER — Other Ambulatory Visit: Payer: 59

## 2015-08-23 DIAGNOSIS — Z8042 Family history of malignant neoplasm of prostate: Secondary | ICD-10-CM

## 2015-08-24 LAB — PSA (REFLEX TO FREE) (SERIAL): PSA: 10.42 ng/mL — ABNORMAL HIGH (ref <=4.00)

## 2015-08-24 LAB — PSA, FREE
PSA, Free Pct: 12 % — ABNORMAL LOW (ref >25)
PSA, Free: 1.22 ng/mL

## 2015-08-25 ENCOUNTER — Ambulatory Visit (INDEPENDENT_AMBULATORY_CARE_PROVIDER_SITE_OTHER): Payer: 59 | Admitting: Family Medicine

## 2015-08-25 ENCOUNTER — Encounter: Payer: Self-pay | Admitting: Family Medicine

## 2015-08-25 VITALS — BP 114/72 | HR 84 | Resp 14 | Ht 68.75 in | Wt 215.0 lb

## 2015-08-25 DIAGNOSIS — R972 Elevated prostate specific antigen [PSA]: Secondary | ICD-10-CM

## 2015-08-25 DIAGNOSIS — Z8042 Family history of malignant neoplasm of prostate: Secondary | ICD-10-CM | POA: Diagnosis not present

## 2015-08-25 MED ORDER — SULFAMETHOXAZOLE-TRIMETHOPRIM 800-160 MG PO TABS: 1.0000 | ORAL_TABLET | Freq: Two times a day (BID) | ORAL | Status: DC

## 2015-08-25 NOTE — Patient Instructions (Signed)
2 Aleve twice per day 

## 2015-08-25 NOTE — Progress Notes (Signed)
   Subjective:    Patient ID: Wesley White, male    DOB: 04-24-1951, 65 y.o.   MRN: [id number]  HPI Here for consult concerning elevated PSA. He has a family history of prostate cancer with his brother having the disease. His previous PSA in 2013 was below 2. His most recent PSA was slightly above 10. He has had no urinary symptoms but does have a previous history of prostatitis. Also of note is he does admit to sexual activity prior to one of these tests.   Review of Systems     Objective:   Physical Exam Alert and in no distress otherwise not examined       Assessment & Plan:  Elevated PSA  Family history of prostate cancer I discussed with him and his wife the diagnosis of prostate cancer and the use of PSA. Since he does have a previous history of prostatitis and there was sexual activity, he has agreed to treatment of the possibility of a prostate infection with Septra and I will also place him on Aleve 2 pills twice per day. If this is still elevated then further diagnostic testing will be arranged.

## 2015-09-22 ENCOUNTER — Other Ambulatory Visit: Payer: Self-pay | Admitting: Family Medicine

## 2015-09-22 ENCOUNTER — Other Ambulatory Visit: Payer: 59

## 2015-09-22 DIAGNOSIS — R972 Elevated prostate specific antigen [PSA]: Secondary | ICD-10-CM

## 2015-09-23 LAB — PSA: PSA: 10.03 ng/mL — ABNORMAL HIGH (ref <=4.00)

## 2015-09-27 LAB — CP2131 PSA, TOTAL AND FREE
PSA, Free Pct: 10 % — ABNORMAL LOW (ref >25)
PSA, Free: 1.05 ng/mL
PSA: 10.03 ng/mL — ABNORMAL HIGH (ref <=4.00)

## 2015-10-04 ENCOUNTER — Other Ambulatory Visit: Payer: 59

## 2015-12-15 ENCOUNTER — Other Ambulatory Visit (HOSPITAL_COMMUNITY): Payer: Self-pay | Admitting: Urology

## 2015-12-15 DIAGNOSIS — C61 Malignant neoplasm of prostate: Secondary | ICD-10-CM

## 2015-12-27 ENCOUNTER — Ambulatory Visit (HOSPITAL_COMMUNITY)
Admission: RE | Admit: 2015-12-27 | Discharge: 2015-12-27 | Disposition: A | Discharge status: Home / Self Care | Payer: 59 | Source: Ambulatory Visit | Attending: Urology | Admitting: Urology

## 2015-12-27 ENCOUNTER — Encounter (HOSPITAL_COMMUNITY)
Admission: RE | Admit: 2015-12-27 | Discharge: 2015-12-27 | Disposition: A | Discharge status: Home / Self Care | Payer: 59 | Source: Ambulatory Visit | Attending: Urology | Admitting: Urology

## 2015-12-27 DIAGNOSIS — R938 Abnormal findings on diagnostic imaging of other specified body structures: Secondary | ICD-10-CM | POA: Insufficient documentation

## 2015-12-27 DIAGNOSIS — C61 Malignant neoplasm of prostate: Secondary | ICD-10-CM | POA: Insufficient documentation

## 2015-12-27 MED ORDER — TECHNETIUM TC 99M MEDRONATE IV KIT
24.1000 | PACK | Freq: Once | INTRAVENOUS | Status: AC | PRN
  Administered 2015-12-27: 24.1 via INTRAVENOUS

## 2016-01-04 DIAGNOSIS — C61 Malignant neoplasm of prostate: Secondary | ICD-10-CM | POA: Diagnosis not present

## 2016-01-12 ENCOUNTER — Ambulatory Visit (HOSPITAL_COMMUNITY)
Admission: RE | Admit: 2016-01-12 | Discharge: 2016-01-12 | Disposition: A | Discharge status: Home / Self Care | Payer: PPO | Source: Ambulatory Visit | Attending: Urology | Admitting: Urology

## 2016-01-12 ENCOUNTER — Other Ambulatory Visit (HOSPITAL_COMMUNITY): Payer: Self-pay | Admitting: Urology

## 2016-01-12 DIAGNOSIS — C61 Malignant neoplasm of prostate: Secondary | ICD-10-CM

## 2016-01-12 DIAGNOSIS — M954 Acquired deformity of chest and rib: Secondary | ICD-10-CM | POA: Diagnosis not present

## 2016-01-24 ENCOUNTER — Other Ambulatory Visit: Payer: Self-pay | Admitting: Urology

## 2016-01-24 DIAGNOSIS — C61 Malignant neoplasm of prostate: Secondary | ICD-10-CM | POA: Diagnosis not present

## 2016-01-26 ENCOUNTER — Other Ambulatory Visit: Payer: Self-pay | Admitting: Urology

## 2016-01-26 MED ORDER — MAGNESIUM CITRATE PO SOLN: 1.0000 | Freq: Once | ORAL | Status: AC

## 2016-01-26 MED ORDER — FLEET ENEMA 7-19 GM/118ML RE ENEM: 1.0000 | ENEMA | Freq: Once | RECTAL | Status: AC

## 2016-01-31 ENCOUNTER — Encounter: Payer: Self-pay | Admitting: Radiation Oncology

## 2016-01-31 NOTE — Progress Notes (Addendum)
GU Location of Tumor / Histology: prostatic adenocarcinoma  If Prostate Cancer, Gleason Score is (4 + 3) and PSA is (10.42). Prostate volume: 30.9 cc.  Wesley White was referred by his PCP, Dr. [name], to Dr. Gaynelle Arabian for evaluation of an elevated PSA.  Biopsies of prostate (if applicable) revealed:    Past/Anticipated interventions by urology, if any: biopsy and referral to Dr. Tammi Klippel to discuss radiation therapy options. Patient tentatively scheduled for prostatectomy?  Past/Anticipated interventions by medical oncology, if any: no  Weight changes, if any: no  Bowel/Bladder complaints, if any: Slight burning with ejaculation. Denies dysuria, hematuria, nocturia, leakage, or intermittent stream. IPSS 0.   Nausea/Vomiting, if any: no  Pain issues, if any:  Back pain intermittent managed with OTC medication occasionally.   SAFETY ISSUES:  Prior radiation? no  Pacemaker/ICD? no  Possible current pregnancy? no  Is the patient on methotrexate? no  Current Complaints / other details:  65 year old male. NKDA. Married. Brother had prostate cancer that he had surgically removed. Reports fatigue. Patient denies a family history of breast cancer. Reports he has been told my all physicians following digital rectal exams that no nodularity was felt.

## 2016-02-01 ENCOUNTER — Encounter: Payer: Self-pay | Admitting: Radiation Oncology

## 2016-02-01 ENCOUNTER — Ambulatory Visit
Admission: RE | Admit: 2016-02-01 | Discharge: 2016-02-01 | Disposition: A | Discharge status: Home / Self Care | Payer: PPO | Source: Ambulatory Visit | Attending: Radiation Oncology | Admitting: Radiation Oncology

## 2016-02-01 ENCOUNTER — Encounter: Payer: Self-pay | Admitting: Medical Oncology

## 2016-02-01 DIAGNOSIS — N189 Chronic kidney disease, unspecified: Secondary | ICD-10-CM | POA: Diagnosis not present

## 2016-02-01 DIAGNOSIS — C61 Malignant neoplasm of prostate: Secondary | ICD-10-CM | POA: Insufficient documentation

## 2016-02-01 DIAGNOSIS — R7303 Prediabetes: Secondary | ICD-10-CM | POA: Diagnosis not present

## 2016-02-01 HISTORY — DX: Malignant neoplasm of prostate: C61

## 2016-02-01 NOTE — Progress Notes (Signed)
Radiation Oncology         [phone]) [phone] ________________________________  Initial outpatient Consultation  Name: Wesley White MRN: [id number]  Date: 02/01/2016  DOB: 08/16/1950  [id number] Juanda Crumble, MD  Carolan Clines, MD   REFERRING PHYSICIAN: Carolan Clines, MD  DIAGNOSIS: 65 y.o. gentleman with stage T1c adenocarcinoma of the prostate with a Gleason's score of 4+3 and a PSA of 10.42    ICD-9-CM ICD-10-CM   1. Malignant neoplasm of prostate ([place]) [name] is a 65 y.o. gentleman.  He was noted to have an elevated PSA of 10.4 by his primary care physician, Dr. Redmond School.  Accordingly, he was referred for evaluation in urology by Dr. Gaynelle Arabian,  digital rectal examination was performed at that time revealing no nodules.  The patient proceeded to transrectal ultrasound with 12 biopsies of the prostate on 12/08/15.  The prostate volume measured 30.9 cc.  Out of 12 core biopsies, 10 were positive.  The maximum Gleason score was 4+3, and this was seen in the distribution shown below     Staging CT Abdomen/Pelvis on 12/27/15 showed no lymphadenopathy or other evidence of metastatic disease.    The patient reviewed the biopsy results with his urologist and he has kindly been referred today for discussion of potential radiation treatment options.    [name]  PREVIOUS RADIATION THERAPY: No  PAST MEDICAL HISTORY:  has a past medical history of Chronic kidney disease; Prediabetes; and Prostate cancer ([place]).    PAST SURGICAL HISTORY: Past Surgical History:  Procedure Laterality Date  . HERNIA REPAIR     1983 baland   . LITHOTRIPSY     2011 by tannenbaum  . PROSTATE BIOPSY      FAMILY HISTORY: family history includes Bipolar disorder in his daughter; Cancer (age of onset: 60) in his brother; Cancer (age of onset: 72) in his father; Diabetes in his mother; Hyperlipidemia in his mother; Hypertension in his mother.  SOCIAL HISTORY:  reports  that he has never smoked. He has never used smokeless tobacco. He reports that he drinks alcohol. He reports that he does not use drugs.  ALLERGIES: Review of patient's allergies indicates no known allergies.  MEDICATIONS:  No current outpatient prescriptions on file.   No current facility-administered medications for this encounter.    Facility-Administered Medications Ordered in Other Encounters  Medication Dose Route Frequency Provider Last Rate Last Dose  . magnesium citrate solution 1 Bottle  1 Bottle Oral Once Raynelle Bring, MD      . sodium phosphate (FLEET) 7-19 GM/118ML enema 1 enema  1 enema Rectal Once Raynelle Bring, MD        REVIEW OF SYSTEMS:  A 15 point review of systems is documented in the electronic medical record. This was obtained by the nursing staff. However, I reviewed this with the patient to discuss relevant findings and make appropriate changes.  Pertinent items are noted in HPI..  The patient completed an IPSS and IIEF questionnaire.  His IPSS score was zero indicating mild urinary outflow obstructive symptoms.  He indicated that his erectile function is able to complete sexual activity on most attempts.   PHYSICAL EXAM: This patient is in no acute distress.  He is alert and oriented.   height is 5\' 10"  (1.778 m) and weight is 226 lb 14.4 oz (102.9 kg). His blood pressure is 127/82 and his pulse is 84. His respiration is 16 and oxygen saturation is 100%.  He exhibits no  respiratory distress or labored breathing.  He appears neurologically intact.  His mood is pleasant.  His affect is appropriate.  Please note the digital rectal exam findings described above.  KPS = 100  100 - Normal; no complaints; no evidence of disease. 90   - Able to carry on normal activity; minor signs or symptoms of disease. 80   - Normal activity with effort; some signs or symptoms of disease. 39   - Cares for self; unable to carry on normal activity or to do active work. 60   - Requires  occasional assistance, but is able to care for most of his personal needs. 50   - Requires considerable assistance and frequent medical care. 31   - Disabled; requires special care and assistance. 48   - Severely disabled; hospital admission is indicated although death not imminent. 11   - Very sick; hospital admission necessary; active supportive treatment necessary. 10   - Moribund; fatal processes progressing rapidly. 0     - Dead  Karnofsky DA, Abelmann [place], Craver LS and Burchenal [facility] [phone]) The use of the nitrogen mustards in the palliative treatment of carcinoma: with particular reference to bronchogenic carcinoma Cancer 1 634-56   LABORATORY DATA:  Lab Results  Component Value Date   WBC 5.5 06/08/2015   HGB 17.1 (H) 06/08/2015   HCT 49.5 06/08/2015   MCV 90.3 06/08/2015   PLT 230 06/08/2015   Lab Results  Component Value Date   NA 137 06/08/2015   K 4.4 06/08/2015   CL 103 06/08/2015   CO2 25 06/08/2015   Lab Results  Component Value Date   ALT 87 (H) 06/08/2015   AST 38 (H) 06/08/2015   ALKPHOS 70 06/08/2015   BILITOT 0.6 06/08/2015     RADIOGRAPHY: Dg Chest 2 View  Result Date: 01/12/2016 CLINICAL DATA:  65 year old male with prostate cancer and abnormal posterior right seventh rib on recent whole-body bone scan. Subsequent encounter. EXAM: CHEST  2 VIEW COMPARISON:  Whole-body bone scan 12/27/2015. FINDINGS: There is irregularity of the posterior right seventh rib, roughly corresponding to the area of abnormal bone scan activity recently. The appearance resembles a mildly displaced fracture, but the abnormality is visible on only 1 of the two views. No associated sclerotic lesion is evident. Elsewhere bone mineralization appears normal for age. Normal cardiac size and mediastinal contours. Visualized tracheal air column is within normal limits. Normal lung volumes. The lungs are clear. No pneumothorax or pleural effusion. IMPRESSION: 1. Posterior seventh rib  irregularity likely corresponding to the recent bone scan activity favored to be a mildly displaced fracture. But this is only seen on one view. Chest CT (noncontrast should suffice) would be confirmatory. 2. No other thoracic bone lesion identified. No acute cardiopulmonary abnormality. Electronically Signed   By: Genevie Ann M.D.   On: 01/12/2016 09:51      IMPRESSION: This gentleman is a  65 y.o. gentleman with stage T1c adenocarcinoma of the prostate with a Gleason's score of 4+3 and a PSA of 10.42.  His Gleason's Score and PSA put him into the intermediate risk group.  Accordingly he is eligible for a variety of potential treatment options including prostatectomy versus radiotherapy with or without ADT and possible seed implant boost.  PLAN:Today I reviewed the findings and workup thus far.  We discussed the natural history of prostate cancer.  We reviewed the the implications of T-stage, Gleason's Score, and PSA on decision-making and outcomes related to prostate cancer.  We  discussed radiation treatment in the management of prostate cancer with regard to the logistics and delivery of external beam radiation treatment as well as the logistics and delivery of prostate brachytherapy.  We compared and contrasted each of these approaches and also compared these against prostatectomy.    The patient focused most of his questions and interest in robotic-assisted laparoscopic radical prostatectomy.  We discussed some of the potential advantages of surgery including surgical staging, the availability of salvage radiotherapy to the prostatic fossa, and the confidence associated with immediate biochemical response.  We discussed some of the potential proven indications for postoperative radiotherapy including positive margins, extracapsular extension, and seminal vesicle involvement. We also talked about some of the other potential findings leading to a recommendation for radiotherapy including a non-zero  postoperative PSA and positive lymph nodes.   The patient would like to proceed with prostatectomy. I enjoyed meeting with him today, and will look forward to participating in the care of this very nice gentleman.  I will look forward to following his progress   I spent time face to face with the patient and more than 50% of that time was spent in counseling and/or coordination of care.     ------------------------------------------------  Sheral Apley. Tammi Klippel, M.D.

## 2016-02-01 NOTE — Progress Notes (Signed)
See progress note under physician encounter. 

## 2016-02-06 DIAGNOSIS — C61 Malignant neoplasm of prostate: Secondary | ICD-10-CM | POA: Diagnosis not present

## 2016-02-06 DIAGNOSIS — R278 Other lack of coordination: Secondary | ICD-10-CM | POA: Diagnosis not present

## 2016-02-06 DIAGNOSIS — M6281 Muscle weakness (generalized): Secondary | ICD-10-CM | POA: Diagnosis not present

## 2016-02-09 DIAGNOSIS — M62838 Other muscle spasm: Secondary | ICD-10-CM | POA: Diagnosis not present

## 2016-02-09 DIAGNOSIS — R278 Other lack of coordination: Secondary | ICD-10-CM | POA: Diagnosis not present

## 2016-02-09 DIAGNOSIS — M6281 Muscle weakness (generalized): Secondary | ICD-10-CM | POA: Diagnosis not present

## 2016-02-09 DIAGNOSIS — M6208 Separation of muscle (nontraumatic), other site: Secondary | ICD-10-CM | POA: Diagnosis not present

## 2016-02-09 DIAGNOSIS — M545 Low back pain: Secondary | ICD-10-CM | POA: Diagnosis not present

## 2016-02-10 ENCOUNTER — Ambulatory Visit
Admission: RE | Admit: 2016-02-10 | Discharge: 2016-02-10 | Disposition: A | Discharge status: Home / Self Care | Payer: PPO | Source: Ambulatory Visit | Attending: Urology | Admitting: Urology

## 2016-02-10 DIAGNOSIS — C61 Malignant neoplasm of prostate: Secondary | ICD-10-CM

## 2016-02-10 MED ORDER — GADOBENATE DIMEGLUMINE 529 MG/ML IV SOLN
20.0000 mL | Freq: Once | INTRAVENOUS | Status: AC | PRN
  Administered 2016-02-10: 20 mL via INTRAVENOUS

## 2016-02-14 NOTE — Patient Instructions (Signed)
Wesley White  02/14/2016   Your procedure is scheduled on: 03/05/2016    Report to [facility] [address] [facility]  Entrance take [place]  elevators to 3rd floor to  [place] at   [place] AM.  Call this number if you have problems the morning of surgery [phone]   Remember: ONLY 1 PERSON MAY GO WITH YOU TO SHORT STAY TO GET  READY MORNING OF YOUR SURGERY.  Do not eat food or drink liquids :After Midnight.     Take these medicines the morning of surgery with A SIP OF WATER: none                                You may not have any metal on your body including hair pins and              piercings  Do not wear jewelry, , lotions, powders or perfumes, deodorant                     Men may shave face and neck.   Do not bring valuables to the hospital. [place].  Contacts, dentures or bridgework may not be worn into surgery.  Leave suitcase in the car. After surgery it may be brought to your room.        Special Instructions: N/A              Please read over the following fact sheets you were given: _____________________________________________________________________             [facility] - Preparing for Surgery Before surgery, you can play an important role.  Because skin is not sterile, your skin needs to be as free of germs as possible.  You can reduce the number of germs on your skin by washing with CHG (chlorahexidine gluconate) soap before surgery.  CHG is an antiseptic cleaner which kills germs and bonds with the skin to continue killing germs even after washing. Please DO NOT use if you have an allergy to CHG or antibacterial soaps.  If your skin becomes reddened/irritated stop using the CHG and inform your nurse when you arrive at Short Stay. Do not shave (including legs and underarms) for at least 48 hours prior to the first CHG shower.  You may shave your face/neck. Please follow these instructions  carefully:  1.  Shower with CHG Soap the night before surgery and the  morning of Surgery.  2.  If you choose to wash your hair, wash your hair first as usual with your  normal  shampoo.  3.  After you shampoo, rinse your hair and body thoroughly to remove the  shampoo.                           4.  Use CHG as you would any other liquid soap.  You can apply chg directly  to the skin and wash                       Gently with a scrungie or clean washcloth.  5.  Apply the CHG Soap to your body ONLY FROM THE NECK DOWN.   Do  not use on face/ open                           Wound or open sores. Avoid contact with eyes, ears mouth and genitals (private parts).                       Wash face,  Genitals (private parts) with your normal soap.             6.  Wash thoroughly, paying special attention to the area where your surgery  will be performed.  7.  Thoroughly rinse your body with warm water from the neck down.  8.  DO NOT shower/wash with your normal soap after using and rinsing off  the CHG Soap.                9.  Pat yourself dry with a clean towel.            10.  Wear clean pajamas.            11.  Place clean sheets on your bed the night of your first shower and do not  sleep with pets. Day of Surgery : Do not apply any lotions/deodorants the morning of surgery.  Please wear clean clothes to the hospital/surgery center.  FAILURE TO FOLLOW THESE INSTRUCTIONS MAY RESULT IN THE CANCELLATION OF YOUR SURGERY PATIENT SIGNATURE_________________________________  NURSE SIGNATURE__________________________________  ________________________________________________________________________  WHAT IS A BLOOD TRANSFUSION? Blood Transfusion Information  A transfusion is the replacement of blood or some of its parts. Blood is made up of multiple cells which provide different functions.  Red blood cells carry oxygen and are used for blood loss replacement.  White blood cells fight against  infection.  Platelets control bleeding.  Plasma helps clot blood.  Other blood products are available for specialized needs, such as hemophilia or other clotting disorders. BEFORE THE TRANSFUSION  Who gives blood for transfusions?   Healthy volunteers who are fully evaluated to make sure their blood is safe. This is blood bank blood. Transfusion therapy is the safest it has ever been in the practice of medicine. Before blood is taken from a donor, a complete history is taken to make sure that person has no history of diseases nor engages in risky social behavior (examples are intravenous drug use or sexual activity with multiple partners). The donor's travel history is screened to minimize risk of transmitting infections, such as malaria. The donated blood is tested for signs of infectious diseases, such as HIV and hepatitis. The blood is then tested to be sure it is compatible with you in order to minimize the chance of a transfusion reaction. If you or a relative donates blood, this is often done in anticipation of surgery and is not appropriate for emergency situations. It takes many days to process the donated blood. RISKS AND COMPLICATIONS Although transfusion therapy is very safe and saves many lives, the main dangers of transfusion include:   Getting an infectious disease.  Developing a transfusion reaction. This is an allergic reaction to something in the blood you were given. Every precaution is taken to prevent this. The decision to have a blood transfusion has been considered carefully by your caregiver before blood is given. Blood is not given unless the benefits outweigh the risks. AFTER THE TRANSFUSION  Right after receiving a blood transfusion, you will usually feel much better and more energetic. This is especially true  if your red blood cells have gotten low (anemic). The transfusion raises the level of the red blood cells which carry oxygen, and this usually causes an energy  increase.  The nurse administering the transfusion will monitor you carefully for complications. HOME CARE INSTRUCTIONS  No special instructions are needed after a transfusion. You may find your energy is better. Speak with your caregiver about any limitations on activity for underlying diseases you may have. SEEK MEDICAL CARE IF:   Your condition is not improving after your transfusion.  You develop redness or irritation at the intravenous (IV) site. SEEK IMMEDIATE MEDICAL CARE IF:  Any of the following symptoms occur over the next 12 hours:  Shaking chills.  You have a temperature by mouth above 102 F (38.9 C), not controlled by medicine.  Chest, back, or muscle pain.  People around you feel you are not acting correctly or are confused.  Shortness of breath or difficulty breathing.  Dizziness and fainting.  You get a rash or develop hives.  You have a decrease in urine output.  Your urine turns a dark color or changes to pink, red, or brown. Any of the following symptoms occur over the next 10 days:  You have a temperature by mouth above 102 F (38.9 C), not controlled by medicine.  Shortness of breath.  Weakness after normal activity.  The white part of the eye turns yellow (jaundice).  You have a decrease in the amount of urine or are urinating less often.  Your urine turns a dark color or changes to pink, red, or brown. Document Released: 05/11/2000 Document Revised: 08/06/2011 Document Reviewed: 12/29/2007 ExitCare Patient Information 2014 [place].  _______________________________________________________________________  Incentive Spirometer  An incentive spirometer is a tool that can help keep your lungs clear and active. This tool measures how well you are filling your lungs with each breath. Taking long deep breaths may help reverse or decrease the chance of developing breathing (pulmonary) problems (especially infection) following:  A long  period of time when you are unable to move or be active. BEFORE THE PROCEDURE   If the spirometer includes an indicator to show your best effort, your nurse or respiratory therapist will set it to a desired goal.  If possible, sit up straight or lean slightly forward. Try not to slouch.  Hold the incentive spirometer in an upright position. INSTRUCTIONS FOR USE  1. Sit on the edge of your bed if possible, or sit up as far as you can in bed or on a chair. 2. Hold the incentive spirometer in an upright position. 3. Breathe out normally. 4. Place the mouthpiece in your mouth and seal your lips tightly around it. 5. Breathe in slowly and as deeply as possible, raising the piston or the ball toward the top of the column. 6. Hold your breath for 3-5 seconds or for as long as possible. Allow the piston or ball to fall to the bottom of the column. 7. Remove the mouthpiece from your mouth and breathe out normally. 8. Rest for a few seconds and repeat Steps 1 through 7 at least 10 times every 1-2 hours when you are awake. Take your time and take a few normal breaths between deep breaths. 9. The spirometer may include an indicator to show your best effort. Use the indicator as a goal to work toward during each repetition. 10. After each set of 10 deep breaths, practice coughing to be sure your lungs are clear. If you have an  incision (the cut made at the time of surgery), support your incision when coughing by placing a pillow or rolled up towels firmly against it. Once you are able to get out of bed, walk around indoors and cough well. You may stop using the incentive spirometer when instructed by your caregiver.  RISKS AND COMPLICATIONS  Take your time so you do not get dizzy or light-headed.  If you are in pain, you may need to take or ask for pain medication before doing incentive spirometry. It is harder to take a deep breath if you are having pain. AFTER USE  Rest and breathe slowly and  easily.  It can be helpful to keep track of a log of your progress. Your caregiver can provide you with a simple table to help with this. If you are using the spirometer at home, follow these instructions: [place] IF:   You are having difficultly using the spirometer.  You have trouble using the spirometer as often as instructed.  Your pain medication is not giving enough relief while using the spirometer.  You develop fever of 100.5 F (38.1 C) or higher. SEEK IMMEDIATE MEDICAL CARE IF:   You cough up bloody sputum that had not been present before.  You develop fever of 102 F (38.9 C) or greater.  You develop worsening pain at or near the incision site. MAKE SURE YOU:   Understand these instructions.  Will watch your condition.  Will get help right away if you are not doing well or get worse. Document Released: 09/24/2006 Document Revised: 08/06/2011 Document Reviewed: 11/25/2006 [facility] Patient Information 2014 [place], [place].   ________________________________________________________________________

## 2016-02-15 ENCOUNTER — Encounter (HOSPITAL_COMMUNITY)
Admission: RE | Admit: 2016-02-15 | Discharge: 2016-02-15 | Disposition: A | Discharge status: Home / Self Care | Payer: PPO | Source: Ambulatory Visit | Attending: Urology | Admitting: Urology

## 2016-02-15 ENCOUNTER — Encounter (HOSPITAL_COMMUNITY): Payer: Self-pay

## 2016-02-15 ENCOUNTER — Other Ambulatory Visit: Payer: Self-pay

## 2016-02-15 DIAGNOSIS — Z01812 Encounter for preprocedural laboratory examination: Secondary | ICD-10-CM | POA: Diagnosis not present

## 2016-02-15 DIAGNOSIS — Z01810 Encounter for preprocedural cardiovascular examination: Secondary | ICD-10-CM | POA: Diagnosis not present

## 2016-02-15 DIAGNOSIS — C61 Malignant neoplasm of prostate: Secondary | ICD-10-CM | POA: Diagnosis not present

## 2016-02-15 HISTORY — DX: Sleep apnea, unspecified: G47.30

## 2016-02-15 LAB — BASIC METABOLIC PANEL
Anion gap: 7 (ref 5–15)
BUN: 11 mg/dL (ref 6–20)
CO2: 28 mmol/L (ref 22–32)
Calcium: 9.2 mg/dL (ref 8.9–10.3)
Chloride: 101 mmol/L (ref 101–111)
Creatinine, Ser: 0.74 mg/dL (ref 0.61–1.24)
GFR calc Af Amer: >60 mL/min (ref >60)
GFR calc non Af Amer: >60 mL/min (ref >60)
Glucose, Bld: 105 mg/dL — ABNORMAL HIGH (ref 65–99)
Potassium: 4.6 mmol/L (ref 3.5–5.1)
Sodium: 136 mmol/L (ref 135–145)

## 2016-02-15 LAB — CBC
HCT: 47.8 % (ref 39.0–52.0)
Hemoglobin: 16.1 g/dL (ref 13.0–17.0)
MCH: 30.9 pg (ref 26.0–34.0)
MCHC: 33.7 g/dL (ref 30.0–36.0)
MCV: 91.7 fL (ref 78.0–100.0)
Platelets: 224 10*3/uL (ref 150–400)
RBC: 5.21 MIL/uL (ref 4.22–5.81)
RDW: 13.8 % (ref 11.5–15.5)
WBC: 5.8 10*3/uL (ref 4.0–10.5)

## 2016-02-17 DIAGNOSIS — M6208 Separation of muscle (nontraumatic), other site: Secondary | ICD-10-CM | POA: Diagnosis not present

## 2016-02-17 DIAGNOSIS — M62838 Other muscle spasm: Secondary | ICD-10-CM | POA: Diagnosis not present

## 2016-02-17 DIAGNOSIS — M545 Low back pain: Secondary | ICD-10-CM | POA: Diagnosis not present

## 2016-02-17 DIAGNOSIS — M6281 Muscle weakness (generalized): Secondary | ICD-10-CM | POA: Diagnosis not present

## 2016-02-17 DIAGNOSIS — R278 Other lack of coordination: Secondary | ICD-10-CM | POA: Diagnosis not present

## 2016-02-22 DIAGNOSIS — M62838 Other muscle spasm: Secondary | ICD-10-CM | POA: Diagnosis not present

## 2016-02-22 DIAGNOSIS — M6281 Muscle weakness (generalized): Secondary | ICD-10-CM | POA: Diagnosis not present

## 2016-02-22 DIAGNOSIS — M545 Low back pain: Secondary | ICD-10-CM | POA: Diagnosis not present

## 2016-02-22 DIAGNOSIS — M6208 Separation of muscle (nontraumatic), other site: Secondary | ICD-10-CM | POA: Diagnosis not present

## 2016-02-22 DIAGNOSIS — R278 Other lack of coordination: Secondary | ICD-10-CM | POA: Diagnosis not present

## 2016-02-22 DIAGNOSIS — C61 Malignant neoplasm of prostate: Secondary | ICD-10-CM | POA: Diagnosis not present

## 2016-03-02 NOTE — H&P (Signed)
CC/HPI: CC: Prostate Cancer     Wesley White is a 65 year old gentleman who was found to have an elevated PSA of 10.42. This prompted urologic evaluation and a TRUS biopsy of the prostate by Dr. Gaynelle Arabian on 12/08/15 that indicated Gleason 4+3=7 adenocarcinoma of the prostate with 10 out of 12 biopsy cores positive for malignancy. He has a brother with a history of prostate cancer s/p surgical therapy.   Family history: Brother - s/p radical prostatectomy.   Imaging studies: Bone scan (12/27/15) - posterior right 7th rib uptake (history of trauma to this area), CT (12/27/15) - No pelvic lymphadenopathy   PMH: He has a history of sleep apnea.  PSH: Open inguinal hernia.   TNM stage: cT1c N0 M0  PSA: 10.42  Gleason score: 4+3=7  Biopsy (12/08/15): 10/12 cores positive  Left: L lateral apex (50%, 4+3=7), L apex (50%, 3+4=7, PNI), L lateral mid (60%, 3+4=7), L mid (60%, 4+3=7), L lateral base (80%, 3+4=7, PNI), L base (70%, 3+4=7, PNI)  Right: R apex (20%, 3+3=6), R mid (30%, 3+4=7), R lateral mid (5%, 3+3=6), R base (10%, 4+3=7)  Prostate volume: 30.9 cc   Nomogram  OC disease: 15%  EPE: 82%  SVI: 25%  LNI: 23%  PFS (5 year, 10 year): 46%,31%   Urinary function: IPSS is 1.  Erectile function: SHIM score is 20. He does not take medication.     ALLERGIES: No Allergies    MEDICATIONS: None   GU PSH: Locm 300-399Mg /Ml Iodine,1Ml - 12/27/2015 Prostate Needle Biopsy - 12/08/2015 Renal ESWL, Left - 2011    NON-GU PSH: Inguinal Hernia Repair > 5 yrs - about 1983 Surgical Pathology, Gross And Microscopic Examination For Prostate Needle - 12/08/2015    GU PMH: Prostate Cancer - 01/04/2016 Kidney Stone - 11/16/2015 [place]culus Ureter, Proximal Ureteral Stone On The Left - 2014    NON-GU PMH: Personal history of other diseases of the nervous system and sense organs, History of sleep apnea - 2014    FAMILY HISTORY: Bipolar Disorder - Daughter Death In The Family Father - Runs In  Family Diabetes - Mother Family Health Status - Mother's Age - [place] is 1 daughter and 1 - R[place] In [place] _4__ Living Daughter - Runs In Family Lung Cancer - Father Prostate Cancer - Brother, Runs in Family skin cancer - Father   SOCIAL HISTORY: Marital Status: Married Current Smoking Status: Patient has never smoked.  Light Drinker.  Drinks 2 caffeinated drinks per day.    REVIEW OF SYSTEMS:    GU Review Male:   Patient denies frequent urination, hard to postpone urination, burning/ pain with urination, get up at night to urinate, leakage of urine, stream starts and stops, trouble starting your streams, and have to strain to urinate .  Gastrointestinal (Upper):   Patient denies nausea and vomiting.  Gastrointestinal (Lower):   Patient denies diarrhea and constipation.  Constitutional:   Patient reports fatigue. Patient denies fever, night sweats, and weight loss.  Skin:   Patient denies skin rash/ lesion and itching.  Eyes:   Patient denies blurred vision and double vision.  Ears/ Nose/ Throat:   Patient denies sore throat and sinus problems.  Hematologic/Lymphatic:   Patient denies swollen glands and easy bruising.  Cardiovascular:   Patient denies leg swelling and chest pains.  Respiratory:   Patient denies cough and shortness of breath.  Endocrine:   Patient denies excessive thirst.  Musculoskeletal:  Patient reports back pain. Patient denies joint pain.  Neurological:   Patient denies headaches and dizziness.  Psychologic:   Patient denies depression and anxiety.     MULTI-SYSTEM PHYSICAL EXAMINATION:    Constitutional: Well-nourished. No physical deformities. Normally developed. Good grooming.  Neck: Neck symmetrical, not swollen. Normal tracheal position.  Respiratory: No labored breathing, no use of accessory muscles.   Cardiovascular: Normal temperature, normal extremity pulses, no swelling, no  varicosities.  Lymphatic: No enlargement of neck, axillae, groin.  Skin: No paleness, no jaundice, no cyanosis. No lesion, no ulcer, no rash.  Neurologic / Psychiatric: Oriented to time, oriented to place, oriented to person. No depression, no anxiety, no agitation.  Gastrointestinal: No mass, no tenderness, no rigidity, non obese abdomen.  Eyes: Normal conjunctivae. Normal eyelids.  Ears, Nose, Mouth, and Throat: Left ear no scars, no lesions, no masses. Right ear no scars, no lesions, no masses. Nose no scars, no lesions, no masses. Normal hearing. Normal lips.  Musculoskeletal: Normal gait and station of head and neck.     ASSESSMENT:      ICD-10 Details  1 GU:   Prostate Cancer - C61    PLAN:    1. Prostate cancer: He has elected surgical therapy for his prostate cancer and will undergo a RAL radical prostatectomy and BPLND. I discussed the potential benefits and risks of the procedure, side effects of the proposed treatment, the likelihood of the patient achieving the goals of the procedure, and any potential problems that might occur during the procedure or recuperation.  He gives informed consent to proceed.

## 2016-03-04 NOTE — Anesthesia Preprocedure Evaluation (Addendum)
Anesthesia Evaluation  Patient identified by MRN, date of birth, ID band Patient awake    Reviewed: Allergy & Precautions, NPO status , Patient's Chart, lab work & pertinent test results  Airway Mallampati: II  TM Distance: <3 FB Neck ROM: Full    Dental no notable dental hx.    Pulmonary sleep apnea ,    Pulmonary exam normal breath sounds clear to auscultation       Cardiovascular negative cardio ROS Normal cardiovascular exam Rhythm:Regular Rate:Normal     Neuro/Psych negative neurological ROS  negative psych ROS   GI/Hepatic negative GI ROS, Neg liver ROS,   Endo/Other  negative endocrine ROS  Renal/GU negative Renal ROS  negative genitourinary   Musculoskeletal negative musculoskeletal ROS (+)   Abdominal   Peds negative pediatric ROS (+)  Hematology negative hematology ROS (+)   Anesthesia Other Findings Prostate cancer  Reproductive/Obstetrics negative OB ROS                            Anesthesia Physical Anesthesia Plan  ASA: II  Anesthesia Plan: General   Post-op Pain Management:    Induction: Intravenous  Airway Management Planned: Oral ETT  Additional Equipment:   Intra-op Plan:   Post-operative Plan: Extubation in OR  Informed Consent: I have reviewed the patients History and Physical, chart, labs and discussed the procedure including the risks, benefits and alternatives for the proposed anesthesia with the patient or authorized representative who has indicated his/her understanding and acceptance.   Dental advisory given  Plan Discussed with: CRNA and Surgeon  Anesthesia Plan Comments:        Anesthesia Quick Evaluation

## 2016-03-05 ENCOUNTER — Inpatient Hospital Stay (HOSPITAL_COMMUNITY): Payer: PPO | Admitting: Anesthesiology

## 2016-03-05 ENCOUNTER — Encounter (HOSPITAL_COMMUNITY)
Admission: RE | Disposition: A | Discharge status: Home / Self Care | Payer: Self-pay | Source: Ambulatory Visit | Attending: Urology

## 2016-03-05 ENCOUNTER — Inpatient Hospital Stay (HOSPITAL_COMMUNITY)
Admission: RE | Admit: 2016-03-05 | Discharge: 2016-03-06 | DRG: 708 | Disposition: A | Discharge status: Home / Self Care | Payer: PPO | Source: Ambulatory Visit | Attending: Urology | Admitting: Urology

## 2016-03-05 ENCOUNTER — Encounter (HOSPITAL_COMMUNITY): Payer: Self-pay

## 2016-03-05 DIAGNOSIS — Z833 Family history of diabetes mellitus: Secondary | ICD-10-CM | POA: Diagnosis not present

## 2016-03-05 DIAGNOSIS — Z23 Encounter for immunization: Secondary | ICD-10-CM | POA: Diagnosis not present

## 2016-03-05 DIAGNOSIS — Z8042 Family history of malignant neoplasm of prostate: Secondary | ICD-10-CM | POA: Diagnosis not present

## 2016-03-05 DIAGNOSIS — Z808 Family history of malignant neoplasm of other organs or systems: Secondary | ICD-10-CM | POA: Diagnosis not present

## 2016-03-05 DIAGNOSIS — C61 Malignant neoplasm of prostate: Secondary | ICD-10-CM | POA: Diagnosis not present

## 2016-03-05 DIAGNOSIS — G4730 Sleep apnea, unspecified: Secondary | ICD-10-CM | POA: Diagnosis not present

## 2016-03-05 DIAGNOSIS — Z801 Family history of malignant neoplasm of trachea, bronchus and lung: Secondary | ICD-10-CM

## 2016-03-05 HISTORY — PX: LYMPHADENECTOMY: SHX5960

## 2016-03-05 HISTORY — PX: ROBOT ASSISTED LAPAROSCOPIC RADICAL PROSTATECTOMY: SHX5141

## 2016-03-05 LAB — HEMOGLOBIN AND HEMATOCRIT, BLOOD
HCT: 45.3 % (ref 39.0–52.0)
Hemoglobin: 15.8 g/dL (ref 13.0–17.0)

## 2016-03-05 LAB — TYPE AND SCREEN
ABO/RH(D): A POS
Antibody Screen: NEGATIVE

## 2016-03-05 LAB — ABO/RH: ABO/RH(D): A POS

## 2016-03-05 SURGERY — XI ROBOTIC ASSISTED LAPAROSCOPIC RADICAL PROSTATECTOMY LEVEL 2
Anesthesia: General

## 2016-03-05 MED ORDER — HYDROCODONE-ACETAMINOPHEN 5-325 MG PO TABS: 1.0000 | ORAL_TABLET | Freq: Four times a day (QID) | ORAL | 0 refills | Status: DC | PRN

## 2016-03-05 MED ORDER — DIPHENHYDRAMINE HCL 50 MG/ML IJ SOLN
12.5000 mg | Freq: Four times a day (QID) | INTRAMUSCULAR | Status: DC | PRN
Start: 2016-03-05 — End: 2016-03-06

## 2016-03-05 MED ORDER — SUGAMMADEX SODIUM 500 MG/5ML IV SOLN
INTRAVENOUS | Status: DC | PRN
  Administered 2016-03-05: 100 mg via INTRAVENOUS
  Administered 2016-03-05 (×2): 200 mg via INTRAVENOUS

## 2016-03-05 MED ORDER — LACTATED RINGERS IV SOLN
INTRAVENOUS | Status: DC | PRN
  Administered 2016-03-05: 1000 mL

## 2016-03-05 MED ORDER — DOCUSATE SODIUM 100 MG PO CAPS
100.0000 mg | ORAL_CAPSULE | Freq: Two times a day (BID) | ORAL | Status: DC
  Administered 2016-03-05 – 2016-03-06 (×2): 100 mg via ORAL
  Filled 2016-03-05 (×2): qty 1

## 2016-03-05 MED ORDER — DEXAMETHASONE SODIUM PHOSPHATE 10 MG/ML IJ SOLN
INTRAMUSCULAR | Status: AC
  Filled 2016-03-05: qty 1

## 2016-03-05 MED ORDER — MORPHINE SULFATE (PF) 2 MG/ML IV SOLN
2.0000 mg | INTRAVENOUS | Status: DC | PRN
  Administered 2016-03-05 (×3): 2 mg via INTRAVENOUS
  Filled 2016-03-05 (×3): qty 1

## 2016-03-05 MED ORDER — ROCURONIUM BROMIDE 10 MG/ML (PF) SYRINGE
PREFILLED_SYRINGE | INTRAVENOUS | Status: AC
  Filled 2016-03-05: qty 10

## 2016-03-05 MED ORDER — ONDANSETRON HCL 4 MG/2ML IJ SOLN
INTRAMUSCULAR | Status: AC
  Filled 2016-03-05: qty 2

## 2016-03-05 MED ORDER — SUGAMMADEX SODIUM 500 MG/5ML IV SOLN
INTRAVENOUS | Status: AC
  Filled 2016-03-05: qty 5

## 2016-03-05 MED ORDER — SODIUM CHLORIDE 0.9 % IR SOLN
Status: DC | PRN
  Administered 2016-03-05: 1000 mL via INTRAVESICAL

## 2016-03-05 MED ORDER — ROCURONIUM BROMIDE 100 MG/10ML IV SOLN
INTRAVENOUS | Status: DC | PRN
  Administered 2016-03-05: 60 mg via INTRAVENOUS
  Administered 2016-03-05 (×6): 10 mg via INTRAVENOUS

## 2016-03-05 MED ORDER — PROMETHAZINE HCL 25 MG/ML IJ SOLN
6.2500 mg | INTRAMUSCULAR | Status: DC | PRN
Start: 2016-03-05 — End: 2016-03-05

## 2016-03-05 MED ORDER — GLYCOPYRROLATE 0.2 MG/ML IV SOSY
PREFILLED_SYRINGE | INTRAVENOUS | Status: DC | PRN
  Administered 2016-03-05: .3 mg via INTRAVENOUS

## 2016-03-05 MED ORDER — INFLUENZA VAC SPLIT QUAD 0.5 ML IM SUSY
0.5000 mL | PREFILLED_SYRINGE | INTRAMUSCULAR | Status: AC
  Administered 2016-03-06: 0.5 mL via INTRAMUSCULAR
  Filled 2016-03-05: qty 0.5

## 2016-03-05 MED ORDER — CEFAZOLIN SODIUM-DEXTROSE 2-4 GM/100ML-% IV SOLN
2.0000 g | INTRAVENOUS | Status: AC
  Administered 2016-03-05: 2 g via INTRAVENOUS

## 2016-03-05 MED ORDER — SUCCINYLCHOLINE CHLORIDE 20 MG/ML IJ SOLN
INTRAMUSCULAR | Status: AC
  Filled 2016-03-05: qty 1

## 2016-03-05 MED ORDER — LACTATED RINGERS IV SOLN
INTRAVENOUS | Status: DC | PRN
  Administered 2016-03-05 (×2): via INTRAVENOUS

## 2016-03-05 MED ORDER — MIDAZOLAM HCL 5 MG/5ML IJ SOLN
INTRAMUSCULAR | Status: DC | PRN
  Administered 2016-03-05: 2 mg via INTRAVENOUS

## 2016-03-05 MED ORDER — PNEUMOCOCCAL VAC POLYVALENT 25 MCG/0.5ML IJ INJ
0.5000 mL | INJECTION | INTRAMUSCULAR | Status: AC
  Administered 2016-03-06: 0.5 mL via INTRAMUSCULAR
  Filled 2016-03-05 (×2): qty 0.5

## 2016-03-05 MED ORDER — FENTANYL CITRATE (PF) 100 MCG/2ML IJ SOLN
INTRAMUSCULAR | Status: DC | PRN
  Administered 2016-03-05: 100 ug via INTRAVENOUS
  Administered 2016-03-05 (×3): 50 ug via INTRAVENOUS

## 2016-03-05 MED ORDER — DEXAMETHASONE SODIUM PHOSPHATE 10 MG/ML IJ SOLN
INTRAMUSCULAR | Status: DC | PRN
  Administered 2016-03-05: 10 mg via INTRAVENOUS

## 2016-03-05 MED ORDER — LIDOCAINE HCL (CARDIAC) 20 MG/ML IV SOLN
INTRAVENOUS | Status: DC | PRN
  Administered 2016-03-05: 50 mg via INTRAVENOUS

## 2016-03-05 MED ORDER — BUPIVACAINE-EPINEPHRINE 0.25% -1:200000 IJ SOLN
INTRAMUSCULAR | Status: DC | PRN
  Administered 2016-03-05: 30 mL

## 2016-03-05 MED ORDER — KCL IN DEXTROSE-NACL 20-5-0.45 MEQ/L-%-% IV SOLN
INTRAVENOUS | Status: DC
  Administered 2016-03-05 – 2016-03-06 (×3): via INTRAVENOUS
  Filled 2016-03-05 (×4): qty 1000

## 2016-03-05 MED ORDER — HYDROMORPHONE HCL 1 MG/ML IJ SOLN
INTRAMUSCULAR | Status: DC | PRN
  Administered 2016-03-05 (×4): 0.5 mg via INTRAVENOUS

## 2016-03-05 MED ORDER — HYDROMORPHONE HCL 1 MG/ML IJ SOLN: 0.2500 mg | INTRAMUSCULAR | Status: DC | PRN

## 2016-03-05 MED ORDER — LIDOCAINE 2% (20 MG/ML) 5 ML SYRINGE
INTRAMUSCULAR | Status: AC
  Filled 2016-03-05: qty 5

## 2016-03-05 MED ORDER — HEPARIN SODIUM (PORCINE) 1000 UNIT/ML IJ SOLN
INTRAMUSCULAR | Status: AC
  Filled 2016-03-05: qty 1

## 2016-03-05 MED ORDER — PROPOFOL 10 MG/ML IV BOLUS
INTRAVENOUS | Status: DC | PRN
  Administered 2016-03-05: 160 mg via INTRAVENOUS

## 2016-03-05 MED ORDER — SODIUM CHLORIDE 0.9 % IV BOLUS (SEPSIS)
1000.0000 mL | Freq: Once | INTRAVENOUS | Status: AC
  Administered 2016-03-05: 1000 mL via INTRAVENOUS

## 2016-03-05 MED ORDER — KETOROLAC TROMETHAMINE 30 MG/ML IJ SOLN
30.0000 mg | Freq: Once | INTRAMUSCULAR | Status: AC | PRN
  Administered 2016-03-05: 30 mg via INTRAVENOUS

## 2016-03-05 MED ORDER — CEFAZOLIN SODIUM-DEXTROSE 2-4 GM/100ML-% IV SOLN
INTRAVENOUS | Status: AC
  Filled 2016-03-05: qty 100

## 2016-03-05 MED ORDER — HYDROMORPHONE HCL 2 MG/ML IJ SOLN
INTRAMUSCULAR | Status: AC
  Filled 2016-03-05: qty 1

## 2016-03-05 MED ORDER — FENTANYL CITRATE (PF) 250 MCG/5ML IJ SOLN
INTRAMUSCULAR | Status: AC
  Filled 2016-03-05: qty 5

## 2016-03-05 MED ORDER — KETOROLAC TROMETHAMINE 30 MG/ML IJ SOLN
INTRAMUSCULAR | Status: AC
  Filled 2016-03-05: qty 1

## 2016-03-05 MED ORDER — KETOROLAC TROMETHAMINE 15 MG/ML IJ SOLN
15.0000 mg | Freq: Four times a day (QID) | INTRAMUSCULAR | Status: DC
  Administered 2016-03-05 – 2016-03-06 (×5): 15 mg via INTRAVENOUS
  Filled 2016-03-05 (×5): qty 1

## 2016-03-05 MED ORDER — ONDANSETRON HCL 4 MG/2ML IJ SOLN
INTRAMUSCULAR | Status: DC | PRN
  Administered 2016-03-05: 4 mg via INTRAVENOUS

## 2016-03-05 MED ORDER — SULFAMETHOXAZOLE-TRIMETHOPRIM 800-160 MG PO TABS: 1.0000 | ORAL_TABLET | Freq: Two times a day (BID) | ORAL | 0 refills | Status: DC

## 2016-03-05 MED ORDER — BUPIVACAINE-EPINEPHRINE (PF) 0.25% -1:200000 IJ SOLN
INTRAMUSCULAR | Status: AC
  Filled 2016-03-05: qty 30

## 2016-03-05 MED ORDER — CEFAZOLIN IN D5W 1 GM/50ML IV SOLN
1.0000 g | Freq: Three times a day (TID) | INTRAVENOUS | Status: AC
  Administered 2016-03-05 (×2): 1 g via INTRAVENOUS
  Filled 2016-03-05 (×2): qty 50

## 2016-03-05 MED ORDER — DIPHENHYDRAMINE HCL 12.5 MG/5ML PO ELIX: 12.5000 mg | ORAL_SOLUTION | Freq: Four times a day (QID) | ORAL | Status: DC | PRN

## 2016-03-05 MED ORDER — SUCCINYLCHOLINE CHLORIDE 200 MG/10ML IV SOSY
PREFILLED_SYRINGE | INTRAVENOUS | Status: DC | PRN
  Administered 2016-03-05: 120 mg via INTRAVENOUS

## 2016-03-05 MED ORDER — PROPOFOL 10 MG/ML IV BOLUS
INTRAVENOUS | Status: AC
  Filled 2016-03-05: qty 40

## 2016-03-05 MED ORDER — MIDAZOLAM HCL 2 MG/2ML IJ SOLN
INTRAMUSCULAR | Status: AC
  Filled 2016-03-05: qty 2

## 2016-03-05 MED ORDER — ACETAMINOPHEN 325 MG PO TABS: 650.0000 mg | ORAL_TABLET | ORAL | Status: DC | PRN

## 2016-03-05 SURGICAL SUPPLY — 54 items
ADH SKN CLS APL DERMABOND .7 (GAUZE/BANDAGES/DRESSINGS)
APPLICATOR COTTON TIP 6IN STRL (MISCELLANEOUS) ×3 IMPLANT
CATH FOLEY 2WAY SLVR 18FR 30CC (CATHETERS) ×3 IMPLANT
CATH ROBINSON RED A/P 16FR (CATHETERS) ×3 IMPLANT
CATH ROBINSON RED A/P 8FR (CATHETERS) ×3 IMPLANT
CATH TIEMANN FOLEY 18FR 5CC (CATHETERS) ×3 IMPLANT
CHLORAPREP W/TINT 26ML (MISCELLANEOUS) ×3 IMPLANT
CLIP LIGATING HEM O LOK PURPLE (MISCELLANEOUS) ×6 IMPLANT
COVER SURGICAL LIGHT HANDLE (MISCELLANEOUS) ×3 IMPLANT
COVER TIP SHEARS 8 DVNC (MISCELLANEOUS) ×2 IMPLANT
COVER TIP SHEARS 8MM DA VINCI (MISCELLANEOUS) ×1
CUTTER ECHEON FLEX ENDO 45 340 (ENDOMECHANICALS) ×3 IMPLANT
DECANTER SPIKE VIAL GLASS SM (MISCELLANEOUS) ×3 IMPLANT
DERMABOND ADVANCED (GAUZE/BANDAGES/DRESSINGS)
DERMABOND ADVANCED .7 DNX12 (GAUZE/BANDAGES/DRESSINGS) IMPLANT
DRAPE ARM DVNC X/XI (DISPOSABLE) ×8 IMPLANT
DRAPE COLUMN DVNC XI (DISPOSABLE) ×2 IMPLANT
DRAPE DA VINCI XI ARM (DISPOSABLE) ×4
DRAPE DA VINCI XI COLUMN (DISPOSABLE) ×1
DRAPE SURG IRRIG POUCH 19X23 (DRAPES) ×3 IMPLANT
DRSG TEGADERM 4X4.75 (GAUZE/BANDAGES/DRESSINGS) ×3 IMPLANT
ELECT REM PT RETURN 9FT ADLT (ELECTROSURGICAL) ×3
ELECTRODE REM PT RTRN 9FT ADLT (ELECTROSURGICAL) ×2 IMPLANT
GLOVE BIO SURGEON STRL SZ 6.5 (GLOVE) ×3 IMPLANT
GLOVE BIOGEL M STRL SZ7.5 (GLOVE) ×6 IMPLANT
GOWN STRL REUS W/TWL LRG LVL3 (GOWN DISPOSABLE) ×9 IMPLANT
HOLDER FOLEY CATH W/STRAP (MISCELLANEOUS) ×3 IMPLANT
IRRIG SUCT STRYKERFLOW 2 WTIP (MISCELLANEOUS) ×3
IRRIGATION SUCT STRKRFLW 2 WTP (MISCELLANEOUS) ×2 IMPLANT
IV LACTATED RINGERS 1000ML (IV SOLUTION) ×3 IMPLANT
NDL SAFETY ECLIPSE 18X1.5 (NEEDLE) ×2 IMPLANT
NEEDLE HYPO 18GX1.5 SHARP (NEEDLE) ×3
PACK ROBOT UROLOGY CUSTOM (CUSTOM PROCEDURE TRAY) ×3 IMPLANT
RELOAD GREEN ECHELON 45 (STAPLE) ×3 IMPLANT
SEAL CANN UNIV 5-8 DVNC XI (MISCELLANEOUS) ×8 IMPLANT
SEAL XI 5MM-8MM UNIVERSAL (MISCELLANEOUS) ×4
SOLUTION ELECTROLUBE (MISCELLANEOUS) ×3 IMPLANT
SUT ETHILON 3 0 PS 1 (SUTURE) ×3 IMPLANT
SUT MNCRL 3 0 RB1 (SUTURE) ×2 IMPLANT
SUT MNCRL 3 0 VIOLET RB1 (SUTURE) ×2 IMPLANT
SUT MNCRL AB 4-0 PS2 18 (SUTURE) ×7 IMPLANT
SUT MONOCRYL 3 0 RB1 (SUTURE) ×3
SUT VIC AB 0 CT1 27 (SUTURE) ×3
SUT VIC AB 0 CT1 27XBRD ANTBC (SUTURE) ×2 IMPLANT
SUT VIC AB 0 UR5 27 (SUTURE) ×3 IMPLANT
SUT VIC AB 2-0 SH 27 (SUTURE) ×6
SUT VIC AB 2-0 SH 27X BRD (SUTURE) ×2 IMPLANT
SUT VICRYL 0 UR6 27IN ABS (SUTURE) ×6 IMPLANT
SYR 27GX1/2 1ML LL SAFETY (SYRINGE) ×3 IMPLANT
TOWEL OR 17X26 10 PK STRL BLUE (TOWEL DISPOSABLE) ×3 IMPLANT
TOWEL OR NON WOVEN STRL DISP B (DISPOSABLE) ×3 IMPLANT
TUBING INSUFFLATION 10FT LAP (TUBING) IMPLANT
WATER STERILE IRR 1000ML POUR (IV SOLUTION) ×1 IMPLANT
WATER STERILE IRR 1500ML POUR (IV SOLUTION) ×4 IMPLANT

## 2016-03-05 NOTE — Op Note (Signed)
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 Robotic assisted laparoscopic radical prostatectomy (right nerve sparing) 2. Bilateral robotic assisted laparoscopic pelvic lymphadenectomy  Surgeon: Pryor Curia. M.D.  Assistant(s): Debbrah Alar, PA-C  An assistant was required for this surgical procedure.  The duties of the assistant included but were not limited to suctioning, passing suture, camera manipulation, retraction. This procedure would not be able to be performed without an Environmental consultant.  Resident: Dr. Cleotis Lema  Anesthesia: General  Complications: None  EBL: 150 mL  IVF:  1500 mL crystalloid  Specimens: 1. Prostate and seminal vesicles 2. Right pelvic lymph nodes 3. Left pelvic lymph nodes  Disposition of specimens: Pathology  Drains: 1. 20 Fr coude catheter 2. # 19 Blake pelvic drain  Indication: Wesley White is a 65 y.o. patient with clinically localized prostate cancer.  After a thorough review of the management options for treatment of prostate cancer, he elected to proceed with surgical therapy and the above procedure(s).  We have discussed the potential benefits and risks of the procedure, side effects of the proposed treatment, the likelihood of the patient achieving the goals of the procedure, and any potential problems that might occur during the procedure or recuperation. Informed consent has been obtained.  Description of procedure:  The patient was taken to the operating room and a general anesthetic was administered. He was given preoperative antibiotics, placed in the dorsal lithotomy position, and prepped and draped in the usual sterile fashion. Next a preoperative timeout was performed. A urethral catheter was placed into the bladder and a site was selected near the  umbilicus for placement of the camera port. This was placed using a standard open Hassan technique which allowed entry into the peritoneal cavity under direct vision and without difficulty. An 8 mm port was placed and a pneumoperitoneum established. The camera was then used to inspect the abdomen and there was no evidence of any intra-abdominal injuries or other abnormalities. The remaining abdominal ports were then placed. 8 mm robotic ports were placed in the right lower quadrant, left lower quadrant, and far left lateral abdominal wall. A 5 mm port was placed in the right upper quadrant and a 12 mm port was placed in the right lateral abdominal wall for laparoscopic assistance. All ports were placed under direct vision without difficulty. The surgical cart was then docked.   Utilizing the cautery scissors, the bladder was reflected posteriorly allowing entry into the space of Retzius and identification of the endopelvic fascia and prostate. The periprostatic fat was then removed from the prostate allowing full exposure of the endopelvic fascia. The endopelvic fascia was then incised from the apex back to the base of the prostate bilaterally and the underlying levator muscle fibers were swept laterally off the prostate thereby isolating the dorsal venous complex. The dorsal vein was then stapled and divided with a 45 mm Flex Echelon stapler. Attention then turned to the bladder neck which was divided anteriorly thereby allowing entry into the bladder and exposure of the urethral catheter. The catheter balloon was deflated and the catheter was brought into the operative field and used to retract the prostate anteriorly. The posterior bladder neck was then examined and was divided allowing further dissection between the bladder and prostate posteriorly until the vasa deferentia and seminal vessels were identified. The vasa deferentia were isolated, divided, and lifted  anteriorly. The seminal vesicles were  dissected down to their tips with care to control the seminal vascular arterial blood supply. These structures were then lifted anteriorly and the space between Denonvillier's fascia and the anterior rectum was developed with a combination of sharp and blunt dissection. This isolated the vascular pedicles of the prostate.  The lateral prostatic fascia on the right side of the prostate was then sharply incised allowing release of the neurovascular bundle. The vascular pedicle of the prostate on the right side was then ligated with Weck clips between the prostate and neurovascular bundle and divided with sharp cold scissor dissection resulting in neurovascular bundle preservation. On the left side, a wide non nerve sparing dissection was performed with Weck clips used to ligate the vascular pedicle of the prostate. The neurovascular bundle on the right side was then separated off the apex of the prostate and urethra.  The urethra was then sharply transected allowing the prostate specimen to be disarticulated. The pelvis was copiously irrigated and hemostasis was ensured. There was no evidence for rectal injury.  Attention then turned to the right pelvic sidewall. The fibrofatty tissue between the external iliac vein, confluence of the iliac vessels, hypogastric artery, and Cooper's ligament was dissected free from the pelvic sidewall with care to preserve the obturator nerve. Weck clips were used for lymphostasis and hemostasis. An identical procedure was performed on the contralateral side and the lymphatic packets were removed for permanent pathologic analysis.  Attention then turned to the urethral anastomosis. A 2-0 Vicryl slip knot was placed between Denonvillier's fascia, the posterior bladder neck, and the posterior urethra to reapproximate these structures. A double-armed 3-0 Monocryl suture was then used to perform a 360 running tension-free anastomosis between the bladder neck and urethra. A new  urethral catheter was then placed into the bladder and irrigated. There were no blood clots within the bladder and the anastomosis appeared to be watertight. A #19 Blake drain was then brought through the left lateral 8 mm port site and positioned appropriately within the pelvis. It was secured to the skin with a nylon suture. The surgical cart was then undocked. The right lateral 12 mm port site was closed at the fascial level with a 0 Vicryl suture placed laparoscopically. All remaining ports were then removed under direct vision. The prostate specimen was removed intact within the Endopouch retrieval bag via the periumbilical camera port site. This fascial opening was closed with two running 0 Vicryl sutures. 0.25% Marcaine was then injected into all port sites and all incisions were reapproximated at the skin level with 4-0 Monocryl subcuticular sutures. Liquiband was applied. The patient appeared to tolerate the procedure well and without complications. The patient was able to be extubated and transferred to the recovery unit in satisfactory condition.   Pryor Curia MD

## 2016-03-05 NOTE — Anesthesia Postprocedure Evaluation (Signed)
Anesthesia Post Note  Patient: Wesley White  Procedure(s) Performed: Procedure(s) (LRB): XI ROBOTIC ASSISTED LAPAROSCOPIC RADICAL PROSTATECTOMY LEVEL 2 (N/A) PELVIC LYMPHADENECTOMY (Bilateral)  Patient location during evaluation: PACU Anesthesia Type: General Level of consciousness: awake and alert Pain management: pain level controlled Vital Signs Assessment: post-procedure vital signs reviewed and stable Respiratory status: spontaneous breathing, nonlabored ventilation, respiratory function stable and patient connected to nasal cannula oxygen Cardiovascular status: blood pressure returned to baseline and stable Postop Assessment: no signs of nausea or vomiting Anesthetic complications: no    Last Vitals:  Vitals:   03/05/16 1145 03/05/16 1200  BP: (!) 156/92 (!) 149/89  Pulse: 96 99  Resp: (!) 8 (!) 22  Temp:      Last Pain:  Vitals:   03/05/16 0553  TempSrc: Oral                 [name]

## 2016-03-05 NOTE — Progress Notes (Signed)
Post-op note  Subjective: The patient is doing well.  No complaints.   Had some mild nausea but has resolved.   Objective: Vital signs in last 24 hours: Temp:  [97.9 F (36.6 C)-98.4 F (36.9 C)] 98 F (36.7 C) (10/09 1228) Pulse Rate:  [87-99] 92 (10/09 1228) Resp:  [8-22] 18 (10/09 1228) BP: (124-161)/(88-114) 154/88 (10/09 1228) SpO2:  [94 %-97 %] 95 % (10/09 1228) Weight:  [100.7 kg (222 lb)] 100.7 kg (222 lb) (10/09 0556)  Intake/Output from previous day: No intake/output data recorded. Intake/Output this shift: Total I/O In: 1600 [I.V.:1600] Out: 325 [Urine:125; Drains:50; Blood:150]  Physical Exam:  General: Alert and oriented. Abdomen: Soft, Nondistended. Incisions: Clean and dry. Urine: red  Lab Results:  Recent Labs  03/05/16 1138  HGB 15.8  HCT 45.3    Assessment/Plan: POD#0   1) Continue to monitor  2) DVT prophy, clears, IS, amb, pain control   LOS: 0 days   Wesley White 03/05/2016, 3:28 PM

## 2016-03-05 NOTE — Progress Notes (Signed)
Patient has home unit. Patient says he is able to place himself.

## 2016-03-05 NOTE — Progress Notes (Signed)
UROLOGY PROGRESS NOTES  Assessment/Plan: Wesley White is a 65 y.o. male with a history of CKD, prediabetes, OSA, prostate cancer s/p RALP 03/05/16 with Dr. Alinda Money.   Interval/Plan: Medlock, remove Jp. Suppository x 1. Start PO pain meds. Continue ambulation/OOB/IS. Possible discharge later today.  Neuro: pain control with PRN tylenol/morphine, 15mg  toradol x6 doses postop, start PO PRN oxycodone Resp: SORA, CPAP at night  Cv: HDS FEN/GI: D5 1/2ns 20Kcl @150cc /hr, replete lytes PRN, clear liquid diet; colace BID. Saline lock today. Gu: Foley catheter in place postop and on discharge, adequate UOP; remove JP this morning Heme/ID: afebrile, stable; H/H stable at 14.4; 2 doses postop Ancef Ppx: OOB, IS Dispo: Floor, possible discharge later today vs tomorrow  Subjective: Some pain with ambulation last evening. Emesis at 9:30pm. No nausea thereafter. No flatus yet. Tolerating liquids. Burning pain in RLQ.   Objective:  Vital signs in last 24 hours: Temp:  [97.9 F (36.6 C)-98.4 F (36.9 C)] 98 F (36.7 C) (10/09 1228) Pulse Rate:  [87-99] 92 (10/09 1228) Resp:  [8-22] 18 (10/09 1228) BP: (124-161)/(88-114) 154/88 (10/09 1228) SpO2:  [94 %-97 %] 95 % (10/09 1228) Weight:  [222 lb (100.7 kg)] 222 lb (100.7 kg) (10/09 0556)  No intake/output data recorded.    Physical Exam:  General:  well-developed and well-nourished male in NAD, lying in bed, alert & oriented, pleasant HEENT: [place]/AT, EOMI, sclera anicteric, hearing grossly intact, no nasal discharge, MMM Respiratory: nonlabored respirations, satting well on RA, symmetrical chest rise Cardiovascular: pulse regular rate & rhythm Abdominal: soft, mildly & appropriately TTP, moderately distended, surgical incisions c/d/i without signs of exudate/erythema; active bowel sounds GU: Foley draining yellow urine; JP with SS drainage Extremities: warm, well-perfused, no c/c/e Neuro: no focal deficits   Data Review: Results for orders  placed or performed during the hospital encounter of 03/05/16 (from the past 24 hour(s))  Type and screen [place]     Status: None   Collection Time: 03/05/16  6:03 AM  Result Value Ref Range   ABO/RH(D) A POS    Antibody Screen NEG    Sample Expiration 03/08/2016   ABO/Rh     Status: None   Collection Time: 03/05/16  6:03 AM  Result Value Ref Range   ABO/RH(D) A POS   Hemoglobin and hematocrit, blood     Status: None   Collection Time: 03/05/16 11:38 AM  Result Value Ref Range   Hemoglobin 15.8 13.0 - 17.0 g/dL   HCT 45.3 39.0 - 52.0 %    Imaging: None

## 2016-03-05 NOTE — Addendum Note (Signed)
Addendum  created 03/05/16 1356 by Lavina Hamman, CRNA   Anesthesia Intra Flowsheets edited

## 2016-03-05 NOTE — Discharge Instructions (Signed)

## 2016-03-05 NOTE — Anesthesia Procedure Notes (Signed)
Procedure Name: Intubation Date/Time: 03/05/2016 7:30 AM Performed by: Myrakle Wingler, Virgel Gess Pre-anesthesia Checklist: Patient identified, Emergency Drugs available, Suction available, Patient being monitored and Timeout performed Patient Re-evaluated:Patient Re-evaluated prior to inductionOxygen Delivery Method: Circle system utilized Preoxygenation: Pre-oxygenation with 100% oxygen Intubation Type: IV induction Ventilation: Two handed mask ventilation required and Oral airway inserted - appropriate to patient size Laryngoscope Size: Mac and 4 Grade View: Grade II Tube type: Oral Tube size: 7.5 mm Number of attempts: 1 Airway Equipment and Method: Stylet Placement Confirmation: ETT inserted through vocal cords under direct vision,  positive ETCO2,  CO2 detector and breath sounds checked- equal and bilateral Secured at: 22 cm Tube secured with: Tape Dental Injury: Teeth and Oropharynx as per pre-operative assessment

## 2016-03-05 NOTE — Transfer of Care (Signed)
Immediate Anesthesia Transfer of Care Note  Patient: Wesley White  Procedure(s) Performed: Procedure(s): XI ROBOTIC ASSISTED LAPAROSCOPIC RADICAL PROSTATECTOMY LEVEL 2 (N/A) PELVIC LYMPHADENECTOMY (Bilateral)  Patient Location: PACU  Anesthesia Type:General  Level of Consciousness:  sedated, patient cooperative and responds to stimulation  Airway & Oxygen Therapy:Patient Spontanous Breathing and Patient connected to face mask oxgen  Post-op Assessment:  Report given to PACU RN and Post -op Vital signs reviewed and stable  Post vital signs:  Reviewed and stable  Last Vitals:  Vitals:   03/05/16 1115 03/05/16 1118  BP: (!) 139/114 (!) 156/94  Pulse: 94 99  Resp: (!) 9 13  Temp:      Complications: No apparent anesthesia complications

## 2016-03-06 DIAGNOSIS — Z23 Encounter for immunization: Secondary | ICD-10-CM | POA: Diagnosis not present

## 2016-03-06 LAB — HEMOGLOBIN AND HEMATOCRIT, BLOOD
HCT: 40.7 % (ref 39.0–52.0)
Hemoglobin: 14.4 g/dL (ref 13.0–17.0)

## 2016-03-06 MED ORDER — OXYCODONE HCL 5 MG PO TABS
5.0000 mg | ORAL_TABLET | ORAL | Status: DC | PRN
  Administered 2016-03-06: 5 mg via ORAL
  Filled 2016-03-06 (×2): qty 1

## 2016-03-06 MED ORDER — BISACODYL 10 MG RE SUPP
10.0000 mg | Freq: Once | RECTAL | Status: AC
  Administered 2016-03-06: 10 mg via RECTAL
  Filled 2016-03-06: qty 1

## 2016-03-06 NOTE — Discharge Summary (Signed)
  Date of admission: 03/05/2016  Date of discharge: 03/06/2016  Admission diagnosis: Prostate Cancer  Discharge diagnosis: Prostate Cancer  History and Physical: For full details, please see admission history and physical. Briefly, Wesley White is a 65 y.o. gentleman with localized prostate cancer.  After discussing management/treatment options, he elected to proceed with surgical treatment.  Hospital Course: Wesley White was taken to the operating room on 03/05/2016 and underwent a robotic assisted laparoscopic radical prostatectomy. He tolerated this procedure well and without complications. Postoperatively, he was able to be transferred to a regular hospital room following recovery from anesthesia.  He was able to begin ambulating the night of surgery. He remained hemodynamically stable overnight.  He had excellent urine output with appropriately minimal output from his pelvic drain and his pelvic drain was removed on POD #1.  He was transitioned to oral pain medication, tolerated a clear liquid diet, and had met all discharge criteria and was able to be discharged home later on POD#1.  Laboratory values:  Recent Labs  03/05/16 1138 03/06/16 0504  HGB 15.8 14.4  HCT 45.3 40.7    Disposition: Home  Discharge instruction: He was instructed to be ambulatory but to refrain from heavy lifting, strenuous activity, or driving. He was instructed on urethral catheter care.  Discharge medications:    Medication List    TAKE these medications   acetaminophen 325 MG tablet Commonly known as:  TYLENOL Take 650 mg by mouth every 6 (six) hours as needed for mild pain.   HYDROcodone-acetaminophen 5-325 MG tablet Commonly known as:  NORCO Take 1-2 tablets by mouth every 6 (six) hours as needed for moderate pain or severe pain.   sulfamethoxazole-trimethoprim 800-160 MG tablet Commonly known as:  BACTRIM DS,SEPTRA DS Take 1 tablet by mouth 2 (two) times daily. Start the day prior to foley  removal appointment       Followup: He will followup in 1 week for catheter removal and to discuss his surgical pathology results.

## 2016-03-06 NOTE — Care Management Note (Signed)
Case Management Note  Patient Details  Name: Wesley White MRN: [id number] Date of Birth: 18-Nov-1950  Subjective/Objective: 65 y/o m admitted w/Prostate Ca. POD#1 rob lap rad prostatectomy.From home.                   Action/Plan:d/c plan home.   Expected Discharge Date:                  Expected Discharge Plan:  Home/Self Care  In-House Referral:     Discharge planning Services  CM Consult  Post Acute Care Choice:    Choice offered to:     DME Arranged:    DME Agency:     HH Arranged:    HH Agency:     Status of Service:  In process, will continue to follow  If discussed at Long Length of Stay Meetings, dates discussed:    Additional Comments:  Dessa Phi, RN 03/06/2016, 11:45 AM

## 2016-03-28 DIAGNOSIS — M62838 Other muscle spasm: Secondary | ICD-10-CM | POA: Diagnosis not present

## 2016-03-28 DIAGNOSIS — M6281 Muscle weakness (generalized): Secondary | ICD-10-CM | POA: Diagnosis not present

## 2016-03-28 DIAGNOSIS — N393 Stress incontinence (female) (male): Secondary | ICD-10-CM | POA: Diagnosis not present

## 2016-03-28 DIAGNOSIS — R278 Other lack of coordination: Secondary | ICD-10-CM | POA: Diagnosis not present

## 2016-04-02 NOTE — Progress Notes (Signed)
Wesley White has several options for treatment of his prostate cancer. He is here today to discuss radiation therapy vs surgery

## 2016-04-03 DIAGNOSIS — N393 Stress incontinence (female) (male): Secondary | ICD-10-CM | POA: Diagnosis not present

## 2016-04-03 DIAGNOSIS — M62838 Other muscle spasm: Secondary | ICD-10-CM | POA: Diagnosis not present

## 2016-04-03 DIAGNOSIS — M6281 Muscle weakness (generalized): Secondary | ICD-10-CM | POA: Diagnosis not present

## 2016-04-03 DIAGNOSIS — R278 Other lack of coordination: Secondary | ICD-10-CM | POA: Diagnosis not present

## 2016-04-12 DIAGNOSIS — M62838 Other muscle spasm: Secondary | ICD-10-CM | POA: Diagnosis not present

## 2016-04-12 DIAGNOSIS — N393 Stress incontinence (female) (male): Secondary | ICD-10-CM | POA: Diagnosis not present

## 2016-04-12 DIAGNOSIS — M6281 Muscle weakness (generalized): Secondary | ICD-10-CM | POA: Diagnosis not present

## 2016-04-12 DIAGNOSIS — R278 Other lack of coordination: Secondary | ICD-10-CM | POA: Diagnosis not present

## 2016-04-18 DIAGNOSIS — N393 Stress incontinence (female) (male): Secondary | ICD-10-CM | POA: Diagnosis not present

## 2016-04-25 DIAGNOSIS — M62838 Other muscle spasm: Secondary | ICD-10-CM | POA: Diagnosis not present

## 2016-04-25 DIAGNOSIS — N393 Stress incontinence (female) (male): Secondary | ICD-10-CM | POA: Diagnosis not present

## 2016-04-25 DIAGNOSIS — R278 Other lack of coordination: Secondary | ICD-10-CM | POA: Diagnosis not present

## 2016-04-25 DIAGNOSIS — M6281 Muscle weakness (generalized): Secondary | ICD-10-CM | POA: Diagnosis not present

## 2016-05-03 DIAGNOSIS — R102 Pelvic and perineal pain: Secondary | ICD-10-CM | POA: Diagnosis not present

## 2016-05-03 DIAGNOSIS — N393 Stress incontinence (female) (male): Secondary | ICD-10-CM | POA: Diagnosis not present

## 2016-05-03 DIAGNOSIS — M62838 Other muscle spasm: Secondary | ICD-10-CM | POA: Diagnosis not present

## 2016-05-03 DIAGNOSIS — M6281 Muscle weakness (generalized): Secondary | ICD-10-CM | POA: Diagnosis not present

## 2016-06-12 ENCOUNTER — Encounter: Payer: Self-pay | Admitting: Medical Oncology

## 2016-06-12 DIAGNOSIS — C61 Malignant neoplasm of prostate: Secondary | ICD-10-CM | POA: Diagnosis not present

## 2016-06-15 ENCOUNTER — Telehealth: Payer: Self-pay | Admitting: Medical Oncology

## 2016-06-15 NOTE — Progress Notes (Signed)
Called [facility] to request slides for Prostate MDC

## 2016-06-15 NOTE — Telephone Encounter (Signed)
I called pt to introduce myself as the Prostate Nurse Navigator and the Coordinator of the Prostate [place]. I briefly met Wesley White 02/01/16 during his consult with Dr. Tammi Klippel.  1. I confirmed with the patient he is aware of his referral to the clinic 06/19/16 arriving at 12:15 pm.  2. I discussed the format of the clinic and the physicians he will be seeing that day.  3. I discussed where the clinic is located and how to contact me.  4. I confirmed his address and informed him I would be mailing a packet of information and forms to be completed. I asked him to bring them with him the day of his appointment.   He voiced understanding of the above. I asked him to call me if he has any questions or concerns regarding his appointments or the forms he needs to complete.

## 2016-06-18 ENCOUNTER — Telehealth: Payer: Self-pay | Admitting: Medical Oncology

## 2016-06-18 NOTE — Telephone Encounter (Signed)
Wesley White confirmed his appointment for Prostate [facility] 06/19/16. I reminded him to bring his completed medical forms. He states that he has not received any forms as of today. Due to the weather they may not have been delivered.

## 2016-06-19 ENCOUNTER — Ambulatory Visit
Admission: RE | Admit: 2016-06-19 | Discharge: 2016-06-19 | Disposition: A | Discharge status: Home / Self Care | Payer: PPO | Source: Ambulatory Visit | Attending: Radiation Oncology | Admitting: Radiation Oncology

## 2016-06-19 ENCOUNTER — Ambulatory Visit (HOSPITAL_BASED_OUTPATIENT_CLINIC_OR_DEPARTMENT_OTHER): Payer: PPO | Admitting: Oncology

## 2016-06-19 ENCOUNTER — Encounter: Payer: Self-pay | Admitting: Medical Oncology

## 2016-06-19 ENCOUNTER — Encounter: Payer: Self-pay | Admitting: Radiation Oncology

## 2016-06-19 DIAGNOSIS — M62838 Other muscle spasm: Secondary | ICD-10-CM | POA: Insufficient documentation

## 2016-06-19 DIAGNOSIS — N521 Erectile dysfunction due to diseases classified elsewhere: Secondary | ICD-10-CM | POA: Diagnosis not present

## 2016-06-19 DIAGNOSIS — M6281 Muscle weakness (generalized): Secondary | ICD-10-CM | POA: Insufficient documentation

## 2016-06-19 DIAGNOSIS — R279 Unspecified lack of coordination: Secondary | ICD-10-CM | POA: Insufficient documentation

## 2016-06-19 DIAGNOSIS — G4730 Sleep apnea, unspecified: Secondary | ICD-10-CM | POA: Diagnosis not present

## 2016-06-19 DIAGNOSIS — C61 Malignant neoplasm of prostate: Secondary | ICD-10-CM | POA: Diagnosis not present

## 2016-06-19 NOTE — Progress Notes (Signed)
Radiation Oncology         [phone] ________________________________  Multidisciplinary Prostate Cancer Clinic  Initial Radiation Oncology Consultation  Name: Mareon Viviano MRN: [id number]  Date: 06/19/2016  DOB: 1950-05-29  [id number] Juanda Crumble, MD  Raynelle Bring, MD   REFERRING PHYSICIAN: Raynelle Bring, MD  DIAGNOSIS: 66 y.o. 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 Malignant neoplasm of prostate ([place]) [place] ILLNESS::Azahel Salado is a 66 y.o. gentleman.  He was noted to have an elevated PSA of 10.4 by his primary care physician, Dr. Redmond School.  Accordingly, he was referred for evaluation in urology by Dr. Gaynelle Arabian,  digital rectal examination was performed at that time revealing no nodules.  The patient proceeded to transrectal ultrasound with 12 biopsies of the prostate on 12/08/2015.  The prostate volume measured 30.9 cc.  Out of 12 core biopsies, 10 were positive.  The maximum Gleason score was 4+3, and this was seen in the distribution shown below:    Staging CT Abdomen/Pelvis on 12/27/15 showed no lymphadenopathy or other evidence of metastatic disease.    Bone scan on 12/27/2015 showed single abnormal focus of increased trace localization at the posterior right seventh rib, nonspecific. Otherwise unremarkable none scintigraphy.   The patient was originally seen for consultation in our clinic on 02/01/2016 and decided to proceed with prostatectomy.   The patient underwent radical prostatectomy on 03/05/2016 with Dr. Alinda Money. Pathology at that time revealed prostatic adenocarcinoma, gleason's score 4+3 with tertiary gleason's pattern 5 involving both prostate lobes with extraprostatic extension. Adenocarcinoma involves the left seminal vesicle. Adenocarcinoma is present at the left bladder neck margin. Lymphovascular invasion  is identified. Perineural invasion is identified. There is no evidence of carcinoma in 5 of 5 lymph nodes from the right pelvis. There is no evidence of carcinoma in 4 of 4 lymph nodes from the left pelvis.  The patient reviewed the pathology results with his urologist and he has kindly been referred today to the multidisciplinary prostate cancer clinic for presentation of pathology and radiology studies in our conference for discussion of potential radiation treatment options and clinical evaluation.        PREVIOUS RADIATION THERAPY: No  PAST MEDICAL HISTORY:  has a past medical history of Chronic kidney disease; Prediabetes; Prostate cancer ([place]); and Sleep apnea.    PAST SURGICAL HISTORY: Past Surgical History:  Procedure Laterality Date  . HERNIA REPAIR     1983 bal[place]   . LITHOTRIPSY     2011 by tannenbaum  . LYMPHADENECTOMY Bilateral 03/05/2016   Procedure: PELVIC LYMPHADENECTOMY;  Surgeon: Raynelle Bring, MD;  Location: [facility];  Service: Urology;  Laterality: Bilateral;  . PROSTATE BIOPSY    . ROBOT ASSISTED LAPAROSCOPIC RADICAL PROSTATECTOMY N/A 03/05/2016   Procedure: XI ROBOTIC ASSISTED LAPAROSCOPIC RADICAL PROSTATECTOMY LEVEL 2;  Surgeon: Raynelle Bring, MD;  Location: [facility];  Service: Urology;  Laterality: N/A;    FAMILY HISTORY: family history includes Bipolar disorder in his daughter; Cancer (age of onset: 77) in his brother; Cancer (age of onset: 42) in his father; Diabetes in his mother; Hyperlipidemia in his mother; Hypertension in his mother.  SOCIAL HISTORY:  reports that he has never smoked. He has never used smokeless tobacco. He reports that he drinks alcohol. He reports that he does not use drugs.  ALLERGIES: Patient has no  known allergies.  MEDICATIONS:  Current Outpatient Prescriptions  Medication Sig Dispense Refill  . acetaminophen (TYLENOL) 325 MG tablet Take 650 mg by mouth every 6 (six) hours as needed for mild pain.    [name] CIALIS 5 MG tablet Take 5 mg by  mouth daily.  11  . HYDROcodone-acetaminophen (NORCO) 5-325 MG tablet Take 1-2 tablets by mouth every 6 (six) hours as needed for moderate pain or severe pain. 30 tablet 0  . sulfamethoxazole-trimethoprim (BACTRIM DS,SEPTRA DS) 800-160 MG tablet Take 1 tablet by mouth 2 (two) times daily. Start the day prior to foley removal appointment 6 tablet 0   No current facility-administered medications for this encounter.    Facility-Administered Medications Ordered in Other Encounters  Medication Dose Route Frequency Provider Last Rate Last Dose  . magnesium citrate solution 1 Bottle  1 Bottle Oral Once Raynelle Bring, MD      . sodium phosphate (FLEET) 7-19 GM/118ML enema 1 enema  1 enema Rectal Once Raynelle Bring, MD        REVIEW OF SYSTEMS:  A 15 point review of systems is documented in the electronic medical record. This was obtained by the nursing staff. However, I reviewed this with the patient to discuss relevant findings and make appropriate changes. The patient completed an IPSS and IIEF questionnaire.  His IPSS score was 1 indicating mild urinary outflow obstructive symptoms, including dysuria, nocturia, and minor leakage.  He indicated that his erectile function is able to complete sexual activity most times. Takes Cialis to manage ED.  On review of systems, the patient reports that he is doing well overall. He denies any chest pain, shortness of breath, cough, fevers, chills, night sweats, unintended weight changes. He denies any bowel disturbances, and denies abdominal pain, nausea or vomiting. He reports he wears two pads per day but, doesn't require a pad at night. He denies any new musculoskeletal or joint aches or pains, new skin lesions or concerns. A complete review of systems is obtained and is otherwise negative.  He has vacation plan in June and hopes to have treatment completed prior. He is interested in starting treatment in mid-March. He would be available for simulation on March  2nd, 5th, or 6th.    PHYSICAL EXAM:    weight is 223 lb (101.2 kg). His oral temperature is 98.2 F (36.8 C). His blood pressure is 126/87 and his pulse is 83. His respiration is 18 and oxygen saturation is 100%.    Please note the digital rectal exam findings described above.  In general this is a well appearing Caucasian male in no acute distress. He is alert and oriented x4 and appropriate throughout the examination.   KPS = 90  100 - Normal; no complaints; no evidence of disease. 90   - Able to carry on normal activity; minor signs or symptoms of disease. 80   - Normal activity with effort; some signs or symptoms of disease. 27   - Cares for self; unable to carry on normal activity or to do active work. 60   - Requires occasional assistance, but is able to care for most of his personal needs. 50   - Requires considerable assistance and frequent medical care. 69   - Disabled; requires special care and assistance. 59   - Severely disabled; hospital admission is indicated although death not imminent. 65   - Very sick; hospital admission necessary; active supportive treatment necessary. 10   - Moribund; fatal processes progressing rapidly. 0     -  Dead  Karnofsky DA, Abelmann [place], Craver LS and Burchenal [facility] [phone]) The use of the nitrogen mustards in the palliative treatment of carcinoma: with particular reference to bronchogenic carcinoma Cancer 1 634-56   LABORATORY DATA:  Lab Results  Component Value Date   WBC 5.8 02/15/2016   HGB 14.4 03/06/2016   HCT 40.7 03/06/2016   MCV 91.7 02/15/2016   PLT 224 02/15/2016   Lab Results  Component Value Date   NA 136 02/15/2016   K 4.6 02/15/2016   CL 101 02/15/2016   CO2 28 02/15/2016   Lab Results  Component Value Date   ALT 87 (H) 06/08/2015   AST 38 (H) 06/08/2015   ALKPHOS 70 06/08/2015   BILITOT 0.6 06/08/2015     RADIOGRAPHY: No results found.    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  Accordingly he is eligible for a variety of potential treatment options including radiation treatment to the prostatic fossa post-prostatectomy.   PLAN: Today I reviewed the findings and workup thus far.  We discussed the natural history of prostate cancer.  We reviewed the the implications of T-stage, Gleason's Score, and PSA on decision-making and outcomes in prostate cancer.  We discussed radiation treatment in the management of prostate cancer with regard to the logistics and delivery of external beam radiation treatment to the prostatic fossa post-prostatectomy. The patient expressed interest in external beam radiotherapy to the prostatic fossa.   The patient would like to proceed with prostate fossa IMRT.  I will share my findings with Dr. Alinda Money and move forward with scheduling simulation in the near future.  He has been scheduled for CT Simulation on March 2nd at 9 am.   I enjoyed meeting with him today, and will look forward to participating in the care of this very nice gentleman.   I spent 60 minutes face to face with the patient and more than 50% of that time was spent in counseling and/or coordination of care.   ------------------------------------------------  Sheral Apley. Tammi Klippel, M.D.   This document serves as a record of services personally performed by Tyler Pita, MD. It was created on his behalf by Arlyce Harman, a trained medical scribe. The creation of this record is based on the scribe's personal observations and the provider's statements to them. This document has been checked and approved by the attending provider.

## 2016-06-19 NOTE — Progress Notes (Signed)
START ON PATHWAY REGIMEN - Prostate  POS74: Refer to Radiation Oncology +/- 6 Months ADT for High Risk Patients   A cycle is every 12 weeks:     Leuprolide acetate depot (Lupron(R)) 22.5 mg flat dose intramuscularly every 12 weeks Dose Mod: None   Daily:     Bicalutamide (Casodex(R)) 50 mg orally once a day Dose Mod: None  **Always confirm dose/schedule in your pharmacy ordering system**    Patient Characteristics: Adenocarcinoma, Post-Prostatectomy Treatment - Immediate Postoperative Period, T3 or Positive Margins AJCC T Stage: 3b AJCC Stage Grouping: III Current radiographic evidence of distant metastasis? No PSA: >= 10 < 20 Gleason Primary: 4 Gleason Secondary: 3 Gleason Score: 7 AJCC M Stage: 0 AJCC N Stage: 0  Intent of Therapy: Curative Intent, Discussed with Patient

## 2016-06-19 NOTE — Consult Note (Signed)
Multi-Disciplinary Clinic 06/19/2016    Wesley White         MRN: [id number]  PRIMARY CARE:  [name], MD  DOB: [birth year], 66 year old Male  REFERRING:  [name], MD  SSN: [id number]  PROVIDER:  Raynelle Bring, M.D.    LOCATION:  [facility] [phone]    CC/HPI: Prostate cancer   He follows up today for further evaluation of his locally advanced prostate cancer. He has the most part regain continence although continues to wear 1 pad per day for safety protective purposes. He will have very light occasional episodes of leakage with certain squatting movements. Otherwise, he seems to be doing quite well. He is seeing partial erections and has been utilizing Cialis 5 mg but only on a prn basis at this time. He can have erections that are almost adequate but not quite good enough for penetration. He presents today in the multidisciplinary clinic to have a discussion regarding the role of adjuvant radiation therapy.     ALLERGIES: No Allergies    MEDICATIONS: Cialis 5 mg tablet 1 tablet PO Daily     GU PSH: Laparoscopy; Lymphadenectomy - 03/05/2016 Locm 300-399Mg /Ml Iodine,1Ml - 12/27/2015 Prostate Needle Biopsy - 12/08/2015 Renal ESWL, Left - 2011 Robotic Radical Prostatectomy - 03/05/2016    NON-GU PSH: Bmi >=30 Calcuate W/Followup - 02/06/2016 Doc Meds Verified W/Pt Or Re - 02/06/2016 Inguinal Hernia Repair > 5 yrs - about 1983 Other Pt/Ot Current Status - 02/06/2016 Other Pt/Ot Goal Status - 02/06/2016 Pain Neg No Plan - 02/06/2016 Surgical Pathology, Gross And Microscopic Examination For Prostate Needle - 12/08/2015        GU PMH: Pelvic and perineal pain - 05/03/2016 ED, arterial insufficiency - 04/18/2016 Stress Incontinence, M/F - 04/03/2016, - 03/28/2016 Low back pain - 02/06/2016 Prostate Cancer - 01/04/2016 Kidney Stone - 11/16/2015 Calculus Ureter, Proximal Ureteral Stone On The Left - 2014      PMH Notes:   1) Prostate cancer: He is s/p a UNS  RAL radical prostatectomy and BPLND on 03/05/2016.   Diagnosis: pT3b N0 Mx, Gleason 4+3=7 (tertiary pattern 5) with a positive surgical margins at the bladder neck  Pretreatment PSA: 10.42  Pretreatment SHIM: 20   NON-GU PMH: Muscle weakness (generalized) - 04/12/2016 Other lack of coordination - 04/12/2016 Other muscle spasm - 04/12/2016 Sleep Apnea    FAMILY HISTORY: Bipolar Disorder - Daughter Death In The Family Father - Runs In Family Diabetes - Mother Family Health Status - Mother's Age - [place] is 1 daughter and 1 - Runs In [place] _4__ Living Daughter - Runs In Family Lung Cancer - Father Prostate Cancer - Brother, Runs in Family skin cancer - Father   SOCIAL HISTORY: Marital Status: Married Current Smoking Status: Patient has never smoked.  Light Drinker.  Drinks 2 caffeinated drinks per day.    REVIEW OF SYSTEMS:     GU Review Male:  Patient denies frequent urination, hard to postpone urination, burning/ pain with urination, get up at night to urinate, leakage of urine, stream starts and stops, trouble starting your streams, and have to strain to urinate .    Gastrointestinal (Upper):  Patient denies nausea and vomiting.    Gastrointestinal (Lower):  Patient denies diarrhea and constipation.    Constitutional:  Patient denies fever, night sweats, weight loss, and fatigue.    Skin:  Patient denies skin rash/  lesion and itching.    Eyes:  Patient denies blurred vision and double vision.    Ears/ Nose/ Throat:  Patient denies sinus problems and sore throat.    Hematologic/Lymphatic:  Patient denies swollen glands and easy bruising.    Cardiovascular:  Patient denies leg swelling and chest pains.    Respiratory:  Patient denies cough and shortness of breath.    Endocrine:  Patient denies excessive thirst.    Musculoskeletal:  Patient denies back pain and joint pain.    Neurological:  Patient denies headaches  and dizziness.    Psychologic:  Patient denies depression and anxiety.    VITAL SIGNS: None     MULTI-SYSTEM PHYSICAL EXAMINATION:      Constitutional: Well-nourished. No physical deformities. Normally developed. Good grooming.            PAST DATA REVIEWED:   Source Of History:  Patient  Lab Test Review:  PSA  Records Review:  Pathology Reports  Urine Test Review:  Urinalysis  X-Ray Review: C.T. Abdomen/Pelvis: Reviewed Films.  Bone Scan: Reviewed Films.      06/12/16 04/11/16  PSA  Total PSA 0.016 ng/dl < 0.015     PROCEDURES: None   ASSESSMENT:     ICD-10 Details  1 GU:  Prostate Cancer - C61    PLAN:   Document  Letter(s):  Created for Patient: Clinical Summary   Created for Patient: Clinical Summary   Notes:  1. Prostate cancer: We reviewed his PSA result which remains undetectable. We again reviewed his very high risk features and the fact that he is at high risk for recurrence in the future. We again reviewed the pros and cons of adjuvant radiation therapy versus ongoing PSA surveillance. He did see Dr. Tammi Klippel earlier today and feels confident that he wishes to proceed with adjuvant radiation therapy. Considering his recovery of urinary continence, he can proceed with radiation therapy at this time. We will plan follow-up in 6 months with a PSA following completion of radiation.   2. Incontinence: This has for the most part resolved. He will continue his pelvic floor exercises.   3. Erectile dysfunction: He is seen expected progress. We did discuss the potential decline in erectile function that may occur with adjuvant radiation therapy and he is willing to accept this risk. He will continue utilizing Cialis either daily or as on demand therapy depending on his preference. We also discussed the option of generic sildenafil. He will notify me if he wishes to change his approach prior to his next appointment.   Cc: Dr. Jill Alexanders  Dr. Tyler Pita  Dr. Zola Button  Dr. Carolan Clines   E & M CODE: I spent at least 25 minutes face to face with the patient, more than 50% of that time was spent on counseling and/or coordinating care.

## 2016-06-19 NOTE — Progress Notes (Signed)
                               Care Plan Summary  Name: Mr. Garreth Flatten DOB: 05/12/1951   Your Medical Team:   Urologist -  Dr. Raynelle Bring, [facility]  Radiation Oncologist - Dr. Tyler Pita, [facility]   Medical Oncologist - Dr. Zola Button, [place]  Recommendations: 1) Radiation Therapy    * These recommendations are based on information available as of today's consult.     Recommendations may change depending on the results of further tests or exams.  Next Steps: 1) March 2- CT simulation (planning)  When appointments need to be scheduled, you will be contacted by [facility] and/or [facility].  Questions?  Please do not hesitate to call Cira Rue, RN, BSN, OCN at [phone] any questions or concerns.  Shirlean Mylar is your Oncology Nurse Navigator and is available to assist you while you're receiving your medical care at [facility]

## 2016-06-19 NOTE — Progress Notes (Signed)
GU Location of Tumor / Histology: prostatic adenocarcinoma s/p radical prostatectomy with a positive surgical margins at bladder neck  If Prostate Cancer, Gleason Score is (4 + 3) and PSA is (10.42) pretreatment.  Wesley White was seen in the [facility] on 02/01/16. Returns today to discuss adjuvant radiation therapy.    Past/Anticipated interventions by urology, if any: prostate biopsy, referral to [facility], prostatectomy, referral back to [facility]  Past/Anticipated interventions by medical oncology, if any: no  Weight changes, if any: no  Bowel/Bladder complaints, if any: dysuria, nocturia, leakage. Reports he wears two pads per day but, doesn't require a pad at night. Takes Cialis to manage ED.   Nausea/Vomiting, if any: no  Pain issues, if any:    SAFETY ISSUES:  Prior radiation? no  Pacemaker/ICD? no  Possible current pregnancy? no  Is the patient on methotrexate? no  Current Complaints / other details:  66 year old male. NKDA. Married. Brother had prostate cancer that he had surgically removed. Patient denies a family hx of breast ca.

## 2016-06-19 NOTE — Progress Notes (Signed)
Reason for Referral: Prostate cancer.   HPI: 66 year old gentleman currently of [place] where he lived since 1982. He is a gentleman diagnosed with prostate cancer back in July 2017. At that time his PSA was 10.42 and a biopsy in October 2012 showed a Gleason score 4+3 = 7. He underwent a radical prostatectomy and bilateral robotic-assisted laparoscopic pelvic lymphadenectomy completed 03/05/2016. The final pathology showed Gleason score 4+3 = 7 with 0 out of 9 lymph nodes showed cancer involvement. The cancer did involve both prostate lobes with extraprostatic extension as well as involvement of the left seminal vesicle. The cancer will also noted in the bladder neck margin. Patient recovered reasonably well from his operation and have retained continence. He resumed most activities of daily living without any decline.  He does not report any headaches, blurry vision, syncope or seizures. He does not report any fevers, chills, sweats or weight loss. He does not report a chest pain, palpitation, orthopnea or leg edema. He is not report any cough, wheezing or hemoptysis. He does not report any nausea, vomiting or abdominal pain. He does not report any frequency urgency or hesitancy. He is not report any skeletal complaints of arthralgias or myalgias. Remaining review of systems unremarkable.   Past Medical History:  Diagnosis Date  . Chronic kidney disease    hx kidney stone year ago  . Prediabetes   . Prostate cancer ([place])   . Sleep apnea   :  Past Surgical History:    Procedure Laterality Date  . HERNIA REPAIR     1983 bal[facility]   . LITHOTRIPSY     2011 by tannenbaum  . LYMPHADENECTOMY Bilateral 03/05/2016   Procedure: PELVIC LYMPHADENECTOMY;  Surgeon: Raynelle Bring, MD;  Location: [facility];  Service: Urology;  Laterality: Bilateral;  . PROSTATE BIOPSY    . ROBOT ASSISTED LAPAROSCOPIC RADICAL PROSTATECTOMY N/A 03/05/2016   Procedure: XI ROBOTIC ASSISTED LAPAROSCOPIC RADICAL PROSTATECTOMY  LEVEL 2;  Surgeon: Raynelle Bring, MD;  Location: [facility];  Service: Urology;  Laterality: N/A;  :   Current Outpatient Prescriptions:  .  acetaminophen (TYLENOL) 325 MG tablet, Take 650 mg by mouth every 6 (six) hours as needed for mild pain., Disp: , Rfl:  .  CIALIS 5 MG tablet, Take 5 mg by mouth daily., Disp: , Rfl: 11 .  HYDROcodone-acetaminophen (NORCO) 5-325 MG tablet, Take 1-2 tablets by mouth every 6 (six) hours as needed for moderate pain or severe pain., Disp: 30 tablet, Rfl: 0 .  sulfamethoxazole-trimethoprim (BACTRIM DS,SEPTRA DS) 800-160 MG tablet, Take 1 tablet by mouth 2 (two) times daily. Start the day prior to foley removal appointment, Disp: 6 tablet, Rfl: 0 No current facility-administered medications for this visit.   Facility-Administered Medications Ordered in Other Visits:  .  magnesium citrate solution 1 Bottle, 1 Bottle, Oral, Once, Raynelle Bring, MD .  sodium phosphate (FLEET) 7-19 GM/118ML enema 1 enema, 1 enema, Rectal, Once, Raynelle Bring, MD:  No Known Allergies:  Family History  Problem Relation Age of Onset  . Cancer Father 68    Lymphoma  . Cancer Brother 68    Prostate  . Diabetes Mother   . Hyperlipidemia Mother   . Hypertension Mother   . Bipolar disorder Daughter   . Colon cancer Neg Hx   . Esophageal cancer Neg Hx   . Rectal cancer Neg Hx   . Stomach cancer Neg Hx   :  Social History   Social History  . Marital status: Married  Spouse name: N/A  . Number of children: N/A  . Years of education: N/A   Occupational History  . Not on file.   Social History Main Topics  . Smoking status: Never Smoker  . Smokeless tobacco: Never Used  . Alcohol use Yes     Comment: rare  . Drug use: No  . Sexual activity: Yes   Other Topics Concern  . Not on file   Social History Narrative  . No narrative on file  :  Pertinent items are noted in HPI.  Exam: ECOG 0 General appearance: alert and cooperative Head: Normocephalic, without  obvious abnormality Throat: lips, mucosa, and tongue normal; teeth and gums normal Neck: no adenopathy Back: negative Resp: clear to auscultation bilaterally Chest wall: no tenderness Cardio: regular rate and rhythm, S1, S2 normal, no murmur, click, rub or gallop GI: soft, non-tender; bowel sounds normal; no masses,  no organomegaly Extremities: extremities normal, atraumatic, no cyanosis or edema Pulses: 2+ and symmetric  CBC    Component Value Date/Time   WBC 5.8 02/15/2016 1100   RBC 5.21 02/15/2016 1100   HGB 14.4 03/06/2016 0504   HCT 40.7 03/06/2016 0504   PLT 224 02/15/2016 1100   MCV 91.7 02/15/2016 1100   MCH 30.9 02/15/2016 1100   MCHC 33.7 02/15/2016 1100   RDW 13.8 02/15/2016 1100   LYMPHSABS 1.7 06/08/2015 0001   MONOABS 0.5 06/08/2015 0001   EOSABS 0.1 06/08/2015 0001   BASOSABS 0.0 06/08/2015 0001     Assessment and Plan:   66 year old gentleman with prostate cancer diagnosed in October 2017. He had a PSA of 10.42 and a Gleason score 4+3 = 7. He is status post radical prostatectomy and lymphadenectomy done on 03/05/2016. The final pathology revealed T3b N0 with extraprostatic extension and positive margins including bladder neck margin.  His case was discussed today in the prostate cancer multidisciplinary clinic. Imaging studies as well as pathology slides were discussed with radiology and pathology respectively. He appears to be a good candidate for adjuvant radiation therapy given his positive margins and extraprostatic extension. The role of additional adjuvant systemic therapy was debated and discussed with the patient today. Adding 6 months of androgen deprivation therapy would be a consideration given some high risk features associated with his cancer. No additional systemic therapy is warranted at this time and we'll see develops advanced disease in the future. He understands his best chance of cure is no and if he develops advanced disease any treatment  options would be palliative at that time.  All his questions were answered today to his satisfaction.

## 2016-06-20 ENCOUNTER — Encounter: Payer: Self-pay | Admitting: General Practice

## 2016-06-20 NOTE — Progress Notes (Signed)
[facility] Psychosocial Distress Screening Spiritual Care  Met with Wesley White and his wife [name]  in [facility] to introduce [facility] team/resources, reviewing distress screen per protocol.  The patient scored a 2.5 on the Psychosocial Distress Thermometer which indicates mild distress. Also assessed for distress and other psychosocial needs.   ONCBCN DISTRESS SCREENING 06/20/2016  Screening Type Initial Screening  Distress experienced in past week (1-10) 2  Family Problem type Children  Physical Problem type Sexual problems  Physician notified of physical symptoms   Referral to clinical psychology   Referral to clinical social work   Referral to dietition   Referral to financial advocate   Referral to support programs Yes  Referral to palliative care     Couple engaged in reflection and life review. In particular, they are grateful that they have "lived a good life all along," rather than "waiting to start life at retirement." Per pt, his gratitude about this and perspective yielded through it help him cope with other stressors.  Per couple, they have three adult children; one daughter, who lives locally, has hx mental illness and sporadic acute needs, which is an ongoing stressor.  Follow up needed: No.  Per couple, no other needs at this time.  They are pleased to learn of [name] team and variety of resources (massage therapy, classes) and plan to register/phone as other needs/interests arise.    [place], [place], [facility] Pager [phone] Voicemail [phone]

## 2016-07-05 ENCOUNTER — Telehealth: Payer: Self-pay | Admitting: Medical Oncology

## 2016-07-05 NOTE — Progress Notes (Signed)
Attempted to call Wesley White to follow up post Prostate [facility]. No answer or answering machine. Will try later.

## 2016-07-26 NOTE — Progress Notes (Signed)
  Radiation Oncology         [phone]) [phone] ________________________________  Name: Wesley White MRN: [id number]  Date: 07/27/2016  DOB: 1950-11-29  SIMULATION AND TREATMENT PLANNING NOTE    ICD-9-CM ICD-10-CM   1. Malignant neoplasm of prostate ([place]) 185 C61     DIAGNOSIS:  66 y.o. gentleman status post prostatectomy with pT3b, pN0  adenocarcinoma of the prostate with a Gleason's score of 4+3 and a detectable PSA of 0.016  NARRATIVE:  The patient was brought to the [facility].  Identity was confirmed.  All relevant records and images related to the planned course of therapy were reviewed.  The patient freely provided informed written consent to proceed with treatment after reviewing the details related to the planned course of therapy. The consent form was witnessed and verified by the simulation staff.  Then, the patient was set-up in a stable reproducible supine position for radiation therapy.  A vacuum lock pillow device was custom fabricated to position his legs in a reproducible immobilized position.  Then, I performed a urethrogram under sterile conditions to identify the prostatic apex.  CT images were obtained.  Surface markings were placed.  The CT images were loaded into the planning software.  Then the prostate target and avoidance structures including the rectum, bladder, bowel and hips were contoured.  Treatment planning then occurred.  The radiation prescription was entered and confirmed.  A total of 1 complex treatment devices were fabricated. I have requested : Intensity Modulated Radiotherapy (IMRT) is medically necessary for this case for the following reason:  Rectal sparing.[name]  PLAN:  The patient will receive 68.4 Gy in 38 fractions to the prostatic fossa  ________________________________  Sheral Apley. Tammi Klippel, M.D.

## 2016-07-27 ENCOUNTER — Ambulatory Visit
Admission: RE | Admit: 2016-07-27 | Discharge: 2016-07-27 | Disposition: A | Discharge status: Home / Self Care | Payer: PPO | Source: Ambulatory Visit | Attending: Radiation Oncology | Admitting: Radiation Oncology

## 2016-07-27 DIAGNOSIS — Z510 Encounter for antineoplastic radiation therapy: Secondary | ICD-10-CM | POA: Insufficient documentation

## 2016-07-27 DIAGNOSIS — C61 Malignant neoplasm of prostate: Secondary | ICD-10-CM | POA: Diagnosis not present

## 2016-08-08 ENCOUNTER — Ambulatory Visit
Admission: RE | Admit: 2016-08-08 | Discharge: 2016-08-08 | Disposition: A | Discharge status: Home / Self Care | Payer: PPO | Source: Ambulatory Visit | Attending: Radiation Oncology | Admitting: Radiation Oncology

## 2016-08-08 DIAGNOSIS — C61 Malignant neoplasm of prostate: Secondary | ICD-10-CM | POA: Diagnosis not present

## 2016-08-08 DIAGNOSIS — Z510 Encounter for antineoplastic radiation therapy: Secondary | ICD-10-CM | POA: Diagnosis not present

## 2016-08-09 ENCOUNTER — Ambulatory Visit
Admission: RE | Admit: 2016-08-09 | Discharge: 2016-08-09 | Disposition: A | Discharge status: Home / Self Care | Payer: PPO | Source: Ambulatory Visit | Attending: Radiation Oncology | Admitting: Radiation Oncology

## 2016-08-09 DIAGNOSIS — Z510 Encounter for antineoplastic radiation therapy: Secondary | ICD-10-CM | POA: Diagnosis not present

## 2016-08-09 DIAGNOSIS — C61 Malignant neoplasm of prostate: Secondary | ICD-10-CM | POA: Diagnosis not present

## 2016-08-10 ENCOUNTER — Ambulatory Visit
Admission: RE | Admit: 2016-08-10 | Discharge: 2016-08-10 | Disposition: A | Discharge status: Home / Self Care | Payer: PPO | Source: Ambulatory Visit | Attending: Radiation Oncology | Admitting: Radiation Oncology

## 2016-08-10 DIAGNOSIS — Z510 Encounter for antineoplastic radiation therapy: Secondary | ICD-10-CM | POA: Diagnosis not present

## 2016-08-10 DIAGNOSIS — C61 Malignant neoplasm of prostate: Secondary | ICD-10-CM | POA: Diagnosis not present

## 2016-08-13 ENCOUNTER — Ambulatory Visit
Admission: RE | Admit: 2016-08-13 | Discharge: 2016-08-13 | Disposition: A | Discharge status: Home / Self Care | Payer: PPO | Source: Ambulatory Visit | Attending: Radiation Oncology | Admitting: Radiation Oncology

## 2016-08-13 DIAGNOSIS — Z510 Encounter for antineoplastic radiation therapy: Secondary | ICD-10-CM | POA: Diagnosis not present

## 2016-08-13 DIAGNOSIS — C61 Malignant neoplasm of prostate: Secondary | ICD-10-CM | POA: Diagnosis not present

## 2016-08-14 ENCOUNTER — Ambulatory Visit
Admission: RE | Admit: 2016-08-14 | Discharge: 2016-08-14 | Disposition: A | Discharge status: Home / Self Care | Payer: PPO | Source: Ambulatory Visit | Attending: Radiation Oncology | Admitting: Radiation Oncology

## 2016-08-14 DIAGNOSIS — Z510 Encounter for antineoplastic radiation therapy: Secondary | ICD-10-CM | POA: Diagnosis not present

## 2016-08-14 DIAGNOSIS — C61 Malignant neoplasm of prostate: Secondary | ICD-10-CM | POA: Diagnosis not present

## 2016-08-15 ENCOUNTER — Ambulatory Visit
Admission: RE | Admit: 2016-08-15 | Discharge: 2016-08-15 | Disposition: A | Discharge status: Home / Self Care | Payer: PPO | Source: Ambulatory Visit | Attending: Radiation Oncology | Admitting: Radiation Oncology

## 2016-08-15 DIAGNOSIS — Z510 Encounter for antineoplastic radiation therapy: Secondary | ICD-10-CM | POA: Diagnosis not present

## 2016-08-15 DIAGNOSIS — C61 Malignant neoplasm of prostate: Secondary | ICD-10-CM | POA: Diagnosis not present

## 2016-08-16 ENCOUNTER — Encounter: Payer: Self-pay | Admitting: Medical Oncology

## 2016-08-16 ENCOUNTER — Ambulatory Visit
Admission: RE | Admit: 2016-08-16 | Discharge: 2016-08-16 | Disposition: A | Discharge status: Home / Self Care | Payer: PPO | Source: Ambulatory Visit | Attending: Radiation Oncology | Admitting: Radiation Oncology

## 2016-08-16 DIAGNOSIS — Z510 Encounter for antineoplastic radiation therapy: Secondary | ICD-10-CM | POA: Diagnosis not present

## 2016-08-16 DIAGNOSIS — C61 Malignant neoplasm of prostate: Secondary | ICD-10-CM | POA: Diagnosis not present

## 2016-08-17 ENCOUNTER — Ambulatory Visit
Admission: RE | Admit: 2016-08-17 | Discharge: 2016-08-17 | Disposition: A | Discharge status: Home / Self Care | Payer: PPO | Source: Ambulatory Visit | Attending: Radiation Oncology | Admitting: Radiation Oncology

## 2016-08-17 DIAGNOSIS — Z510 Encounter for antineoplastic radiation therapy: Secondary | ICD-10-CM | POA: Diagnosis not present

## 2016-08-17 DIAGNOSIS — C61 Malignant neoplasm of prostate: Secondary | ICD-10-CM | POA: Diagnosis not present

## 2016-08-20 ENCOUNTER — Ambulatory Visit
Admission: RE | Admit: 2016-08-20 | Discharge: 2016-08-20 | Disposition: A | Discharge status: Home / Self Care | Payer: PPO | Source: Ambulatory Visit | Attending: Radiation Oncology | Admitting: Radiation Oncology

## 2016-08-20 DIAGNOSIS — Z510 Encounter for antineoplastic radiation therapy: Secondary | ICD-10-CM | POA: Diagnosis not present

## 2016-08-20 DIAGNOSIS — C61 Malignant neoplasm of prostate: Secondary | ICD-10-CM | POA: Diagnosis not present

## 2016-08-21 ENCOUNTER — Ambulatory Visit
Admission: RE | Admit: 2016-08-21 | Discharge: 2016-08-21 | Disposition: A | Discharge status: Home / Self Care | Payer: PPO | Source: Ambulatory Visit | Attending: Radiation Oncology | Admitting: Radiation Oncology

## 2016-08-21 DIAGNOSIS — Z510 Encounter for antineoplastic radiation therapy: Secondary | ICD-10-CM | POA: Diagnosis not present

## 2016-08-21 DIAGNOSIS — C61 Malignant neoplasm of prostate: Secondary | ICD-10-CM | POA: Diagnosis not present

## 2016-08-22 ENCOUNTER — Ambulatory Visit
Admission: RE | Admit: 2016-08-22 | Discharge: 2016-08-22 | Disposition: A | Discharge status: Home / Self Care | Payer: PPO | Source: Ambulatory Visit | Attending: Radiation Oncology | Admitting: Radiation Oncology

## 2016-08-22 DIAGNOSIS — Z510 Encounter for antineoplastic radiation therapy: Secondary | ICD-10-CM | POA: Diagnosis not present

## 2016-08-22 DIAGNOSIS — C61 Malignant neoplasm of prostate: Secondary | ICD-10-CM | POA: Diagnosis not present

## 2016-08-23 ENCOUNTER — Ambulatory Visit
Admission: RE | Admit: 2016-08-23 | Discharge: 2016-08-23 | Disposition: A | Discharge status: Home / Self Care | Payer: PPO | Source: Ambulatory Visit | Attending: Radiation Oncology | Admitting: Radiation Oncology

## 2016-08-23 DIAGNOSIS — Z510 Encounter for antineoplastic radiation therapy: Secondary | ICD-10-CM | POA: Diagnosis not present

## 2016-08-23 DIAGNOSIS — C61 Malignant neoplasm of prostate: Secondary | ICD-10-CM | POA: Diagnosis not present

## 2016-08-24 ENCOUNTER — Ambulatory Visit
Admission: RE | Admit: 2016-08-24 | Discharge: 2016-08-24 | Disposition: A | Discharge status: Home / Self Care | Payer: PPO | Source: Ambulatory Visit | Attending: Radiation Oncology | Admitting: Radiation Oncology

## 2016-08-24 DIAGNOSIS — Z510 Encounter for antineoplastic radiation therapy: Secondary | ICD-10-CM | POA: Diagnosis not present

## 2016-08-24 DIAGNOSIS — C61 Malignant neoplasm of prostate: Secondary | ICD-10-CM | POA: Diagnosis not present

## 2016-08-27 ENCOUNTER — Encounter: Payer: Self-pay | Admitting: Medical Oncology

## 2016-08-27 ENCOUNTER — Ambulatory Visit
Admission: RE | Admit: 2016-08-27 | Discharge: 2016-08-27 | Disposition: A | Discharge status: Home / Self Care | Payer: PPO | Source: Ambulatory Visit | Attending: Radiation Oncology | Admitting: Radiation Oncology

## 2016-08-27 DIAGNOSIS — Z510 Encounter for antineoplastic radiation therapy: Secondary | ICD-10-CM | POA: Diagnosis not present

## 2016-08-27 DIAGNOSIS — C61 Malignant neoplasm of prostate: Secondary | ICD-10-CM | POA: Diagnosis not present

## 2016-08-27 NOTE — Progress Notes (Signed)
Wesley White states he is tolerating radiation well. The only issue is some constipation. We discussed diet and water intake. If this does not help he can take stool softeners. Will continue to follow.

## 2016-08-28 ENCOUNTER — Ambulatory Visit
Admission: RE | Admit: 2016-08-28 | Discharge: 2016-08-28 | Disposition: A | Discharge status: Home / Self Care | Payer: PPO | Source: Ambulatory Visit | Attending: Radiation Oncology | Admitting: Radiation Oncology

## 2016-08-28 DIAGNOSIS — C61 Malignant neoplasm of prostate: Secondary | ICD-10-CM | POA: Diagnosis not present

## 2016-08-28 DIAGNOSIS — Z510 Encounter for antineoplastic radiation therapy: Secondary | ICD-10-CM | POA: Diagnosis not present

## 2016-08-29 ENCOUNTER — Ambulatory Visit
Admission: RE | Admit: 2016-08-29 | Discharge: 2016-08-29 | Disposition: A | Discharge status: Home / Self Care | Payer: PPO | Source: Ambulatory Visit | Attending: Radiation Oncology | Admitting: Radiation Oncology

## 2016-08-29 DIAGNOSIS — Z510 Encounter for antineoplastic radiation therapy: Secondary | ICD-10-CM | POA: Diagnosis not present

## 2016-08-29 DIAGNOSIS — C61 Malignant neoplasm of prostate: Secondary | ICD-10-CM | POA: Diagnosis not present

## 2016-08-30 ENCOUNTER — Ambulatory Visit
Admission: RE | Admit: 2016-08-30 | Discharge: 2016-08-30 | Disposition: A | Discharge status: Home / Self Care | Payer: PPO | Source: Ambulatory Visit | Attending: Radiation Oncology | Admitting: Radiation Oncology

## 2016-08-30 DIAGNOSIS — Z510 Encounter for antineoplastic radiation therapy: Secondary | ICD-10-CM | POA: Diagnosis not present

## 2016-08-30 DIAGNOSIS — C61 Malignant neoplasm of prostate: Secondary | ICD-10-CM | POA: Diagnosis not present

## 2016-08-31 ENCOUNTER — Ambulatory Visit
Admission: RE | Admit: 2016-08-31 | Discharge: 2016-08-31 | Disposition: A | Discharge status: Home / Self Care | Payer: PPO | Source: Ambulatory Visit | Attending: Radiation Oncology | Admitting: Radiation Oncology

## 2016-08-31 VITALS — BP 131/84 | HR 76 | Resp 16 | Wt 226.4 lb

## 2016-08-31 DIAGNOSIS — Z510 Encounter for antineoplastic radiation therapy: Secondary | ICD-10-CM | POA: Diagnosis not present

## 2016-08-31 DIAGNOSIS — C61 Malignant neoplasm of prostate: Secondary | ICD-10-CM

## 2016-08-31 NOTE — Progress Notes (Signed)
Weight and vitals stable. Denies pain. Denies dysuria or hematuria. Denies urgency, leakage or incontinence. Reports nocturia x 1 every other night. Reports bowels are soft but, denies diarrhea. Denies fatigue.   BP 131/84 (BP Location: Right Arm, Patient Position: Sitting, Cuff Size: Normal)   Pulse 76   Resp 16   Wt 226 lb 6.4 oz (102.7 kg)   SpO2 100%   BMI 32.49 kg/m  Wt Readings from Last 3 Encounters:  08/31/16 226 lb 6.4 oz (102.7 kg)  06/19/16 223 lb (101.2 kg)  03/05/16 222 lb (100.7 kg)

## 2016-08-31 NOTE — Progress Notes (Signed)
  Radiation Oncology         [phone]) [phone] ________________________________  Name: Wesley White MRN: [id number]  Date: 08/31/2016  DOB: 06-Mar-1951    Weekly Radiation Therapy Management    ICD-9-CM ICD-10-CM   1. Prostate cancer ([place]) 185 C61      Current Dose: 32.4 Gy     Planned Dose:  68.4 Gy  Narrative . . . . . . . . The patient presents for routine under treatment assessment.                                 Weight and vitals stable. Denies pain. Denies dysuria or hematuria. Denies urgency, leakage, or incontinence. Reports nocturia x 1 every other night. Reports bowels are soft but, denies diarrhea. Denies blood in stool or rectal bleeding. Denies fatigue. Energy level is good.                                 Set-up films were reviewed.                                 The chart was checked. Physical Findings. . .  weight is 226 lb 6.4 oz (102.7 kg). His blood pressure is 131/84 and his pulse is 76. His respiration is 16 and oxygen saturation is 100%. . Weight essentially stable.  No significant changes. Lungs are clear to auscultation bilaterally. Heart has regular rate and rhythm. Abdomen soft, non-tender, normal bowel sounds. Impression . . . . . . . The patient is tolerating radiation. Plan . . . . . . . . . . . . Continue treatment as planned.  ________________________________   Blair Promise, PhD, MD  This document serves as a record of services personally performed by Gery Pray, MD. It was created on his behalf by Arlyce Harman, a trained medical scribe. The creation of this record is based on the scribe's personal observations and the provider's statements to them. This document has been checked and approved by the attending provider.

## 2016-09-03 ENCOUNTER — Ambulatory Visit
Admission: RE | Admit: 2016-09-03 | Discharge: 2016-09-03 | Disposition: A | Discharge status: Home / Self Care | Payer: PPO | Source: Ambulatory Visit | Attending: Radiation Oncology | Admitting: Radiation Oncology

## 2016-09-03 DIAGNOSIS — C61 Malignant neoplasm of prostate: Secondary | ICD-10-CM | POA: Diagnosis not present

## 2016-09-03 DIAGNOSIS — Z510 Encounter for antineoplastic radiation therapy: Secondary | ICD-10-CM | POA: Diagnosis not present

## 2016-09-04 ENCOUNTER — Ambulatory Visit
Admission: RE | Admit: 2016-09-04 | Discharge: 2016-09-04 | Disposition: A | Discharge status: Home / Self Care | Payer: PPO | Source: Ambulatory Visit | Attending: Radiation Oncology | Admitting: Radiation Oncology

## 2016-09-04 DIAGNOSIS — Z510 Encounter for antineoplastic radiation therapy: Secondary | ICD-10-CM | POA: Diagnosis not present

## 2016-09-04 DIAGNOSIS — C61 Malignant neoplasm of prostate: Secondary | ICD-10-CM | POA: Diagnosis not present

## 2016-09-05 ENCOUNTER — Ambulatory Visit
Admission: RE | Admit: 2016-09-05 | Discharge: 2016-09-05 | Disposition: A | Discharge status: Home / Self Care | Payer: PPO | Source: Ambulatory Visit | Attending: Radiation Oncology | Admitting: Radiation Oncology

## 2016-09-05 DIAGNOSIS — C61 Malignant neoplasm of prostate: Secondary | ICD-10-CM | POA: Diagnosis not present

## 2016-09-05 DIAGNOSIS — Z510 Encounter for antineoplastic radiation therapy: Secondary | ICD-10-CM | POA: Diagnosis not present

## 2016-09-06 ENCOUNTER — Ambulatory Visit
Admission: RE | Admit: 2016-09-06 | Discharge: 2016-09-06 | Disposition: A | Discharge status: Home / Self Care | Payer: PPO | Source: Ambulatory Visit | Attending: Radiation Oncology | Admitting: Radiation Oncology

## 2016-09-06 DIAGNOSIS — Z510 Encounter for antineoplastic radiation therapy: Secondary | ICD-10-CM | POA: Diagnosis not present

## 2016-09-06 DIAGNOSIS — C61 Malignant neoplasm of prostate: Secondary | ICD-10-CM | POA: Diagnosis not present

## 2016-09-07 ENCOUNTER — Ambulatory Visit
Admission: RE | Admit: 2016-09-07 | Discharge: 2016-09-07 | Disposition: A | Discharge status: Home / Self Care | Payer: PPO | Source: Ambulatory Visit | Attending: Radiation Oncology | Admitting: Radiation Oncology

## 2016-09-07 DIAGNOSIS — C61 Malignant neoplasm of prostate: Secondary | ICD-10-CM | POA: Diagnosis not present

## 2016-09-07 DIAGNOSIS — Z510 Encounter for antineoplastic radiation therapy: Secondary | ICD-10-CM | POA: Diagnosis not present

## 2016-09-10 ENCOUNTER — Ambulatory Visit
Admission: RE | Admit: 2016-09-10 | Discharge: 2016-09-10 | Disposition: A | Discharge status: Home / Self Care | Payer: PPO | Source: Ambulatory Visit | Attending: Radiation Oncology | Admitting: Radiation Oncology

## 2016-09-10 DIAGNOSIS — C61 Malignant neoplasm of prostate: Secondary | ICD-10-CM | POA: Diagnosis not present

## 2016-09-10 DIAGNOSIS — Z510 Encounter for antineoplastic radiation therapy: Secondary | ICD-10-CM | POA: Diagnosis not present

## 2016-09-11 ENCOUNTER — Ambulatory Visit
Admission: RE | Admit: 2016-09-11 | Discharge: 2016-09-11 | Disposition: A | Discharge status: Home / Self Care | Payer: PPO | Source: Ambulatory Visit | Attending: Radiation Oncology | Admitting: Radiation Oncology

## 2016-09-11 DIAGNOSIS — Z510 Encounter for antineoplastic radiation therapy: Secondary | ICD-10-CM | POA: Diagnosis not present

## 2016-09-11 DIAGNOSIS — C61 Malignant neoplasm of prostate: Secondary | ICD-10-CM | POA: Diagnosis not present

## 2016-09-12 ENCOUNTER — Ambulatory Visit
Admission: RE | Admit: 2016-09-12 | Discharge: 2016-09-12 | Disposition: A | Discharge status: Home / Self Care | Payer: PPO | Source: Ambulatory Visit | Attending: Radiation Oncology | Admitting: Radiation Oncology

## 2016-09-12 DIAGNOSIS — C61 Malignant neoplasm of prostate: Secondary | ICD-10-CM | POA: Diagnosis not present

## 2016-09-12 DIAGNOSIS — Z510 Encounter for antineoplastic radiation therapy: Secondary | ICD-10-CM | POA: Diagnosis not present

## 2016-09-13 ENCOUNTER — Ambulatory Visit
Admission: RE | Admit: 2016-09-13 | Discharge: 2016-09-13 | Disposition: A | Discharge status: Home / Self Care | Payer: PPO | Source: Ambulatory Visit | Attending: Radiation Oncology | Admitting: Radiation Oncology

## 2016-09-13 DIAGNOSIS — Z510 Encounter for antineoplastic radiation therapy: Secondary | ICD-10-CM | POA: Diagnosis not present

## 2016-09-13 DIAGNOSIS — C61 Malignant neoplasm of prostate: Secondary | ICD-10-CM | POA: Diagnosis not present

## 2016-09-14 ENCOUNTER — Ambulatory Visit
Admission: RE | Admit: 2016-09-14 | Discharge: 2016-09-14 | Disposition: A | Discharge status: Home / Self Care | Payer: PPO | Source: Ambulatory Visit | Attending: Radiation Oncology | Admitting: Radiation Oncology

## 2016-09-14 DIAGNOSIS — Z510 Encounter for antineoplastic radiation therapy: Secondary | ICD-10-CM | POA: Diagnosis not present

## 2016-09-14 DIAGNOSIS — C61 Malignant neoplasm of prostate: Secondary | ICD-10-CM | POA: Diagnosis not present

## 2016-09-17 ENCOUNTER — Ambulatory Visit
Admission: RE | Admit: 2016-09-17 | Discharge: 2016-09-17 | Disposition: A | Discharge status: Home / Self Care | Payer: PPO | Source: Ambulatory Visit | Attending: Radiation Oncology | Admitting: Radiation Oncology

## 2016-09-17 DIAGNOSIS — Z510 Encounter for antineoplastic radiation therapy: Secondary | ICD-10-CM | POA: Diagnosis not present

## 2016-09-17 DIAGNOSIS — C61 Malignant neoplasm of prostate: Secondary | ICD-10-CM | POA: Diagnosis not present

## 2016-09-18 ENCOUNTER — Ambulatory Visit
Admission: RE | Admit: 2016-09-18 | Discharge: 2016-09-18 | Disposition: A | Discharge status: Home / Self Care | Payer: PPO | Source: Ambulatory Visit | Attending: Radiation Oncology | Admitting: Radiation Oncology

## 2016-09-18 DIAGNOSIS — Z510 Encounter for antineoplastic radiation therapy: Secondary | ICD-10-CM | POA: Diagnosis not present

## 2016-09-18 DIAGNOSIS — C61 Malignant neoplasm of prostate: Secondary | ICD-10-CM | POA: Diagnosis not present

## 2016-09-19 ENCOUNTER — Ambulatory Visit
Admission: RE | Admit: 2016-09-19 | Discharge: 2016-09-19 | Disposition: A | Discharge status: Home / Self Care | Payer: PPO | Source: Ambulatory Visit | Attending: Radiation Oncology | Admitting: Radiation Oncology

## 2016-09-19 DIAGNOSIS — C61 Malignant neoplasm of prostate: Secondary | ICD-10-CM | POA: Diagnosis not present

## 2016-09-19 DIAGNOSIS — Z510 Encounter for antineoplastic radiation therapy: Secondary | ICD-10-CM | POA: Diagnosis not present

## 2016-09-20 ENCOUNTER — Ambulatory Visit
Admission: RE | Admit: 2016-09-20 | Discharge: 2016-09-20 | Disposition: A | Discharge status: Home / Self Care | Payer: PPO | Source: Ambulatory Visit | Attending: Radiation Oncology | Admitting: Radiation Oncology

## 2016-09-20 DIAGNOSIS — Z510 Encounter for antineoplastic radiation therapy: Secondary | ICD-10-CM | POA: Diagnosis not present

## 2016-09-20 DIAGNOSIS — C61 Malignant neoplasm of prostate: Secondary | ICD-10-CM | POA: Diagnosis not present

## 2016-09-21 ENCOUNTER — Ambulatory Visit
Admission: RE | Admit: 2016-09-21 | Discharge: 2016-09-21 | Disposition: A | Discharge status: Home / Self Care | Payer: PPO | Source: Ambulatory Visit | Attending: Radiation Oncology | Admitting: Radiation Oncology

## 2016-09-21 DIAGNOSIS — C61 Malignant neoplasm of prostate: Secondary | ICD-10-CM | POA: Diagnosis not present

## 2016-09-21 DIAGNOSIS — Z510 Encounter for antineoplastic radiation therapy: Secondary | ICD-10-CM | POA: Diagnosis not present

## 2016-09-24 ENCOUNTER — Ambulatory Visit
Admission: RE | Admit: 2016-09-24 | Discharge: 2016-09-24 | Disposition: A | Discharge status: Home / Self Care | Payer: PPO | Source: Ambulatory Visit | Attending: Radiation Oncology | Admitting: Radiation Oncology

## 2016-09-24 DIAGNOSIS — C61 Malignant neoplasm of prostate: Secondary | ICD-10-CM | POA: Diagnosis not present

## 2016-09-24 DIAGNOSIS — Z510 Encounter for antineoplastic radiation therapy: Secondary | ICD-10-CM | POA: Diagnosis not present

## 2016-09-25 ENCOUNTER — Ambulatory Visit
Admission: RE | Admit: 2016-09-25 | Discharge: 2016-09-25 | Disposition: A | Discharge status: Home / Self Care | Payer: PPO | Source: Ambulatory Visit | Attending: Radiation Oncology | Admitting: Radiation Oncology

## 2016-09-25 DIAGNOSIS — Z510 Encounter for antineoplastic radiation therapy: Secondary | ICD-10-CM | POA: Diagnosis not present

## 2016-09-25 DIAGNOSIS — C61 Malignant neoplasm of prostate: Secondary | ICD-10-CM | POA: Diagnosis not present

## 2016-09-26 ENCOUNTER — Ambulatory Visit
Admission: RE | Admit: 2016-09-26 | Discharge: 2016-09-26 | Disposition: A | Discharge status: Home / Self Care | Payer: PPO | Source: Ambulatory Visit | Attending: Radiation Oncology | Admitting: Radiation Oncology

## 2016-09-26 DIAGNOSIS — C61 Malignant neoplasm of prostate: Secondary | ICD-10-CM | POA: Diagnosis not present

## 2016-09-26 DIAGNOSIS — Z510 Encounter for antineoplastic radiation therapy: Secondary | ICD-10-CM | POA: Diagnosis not present

## 2016-09-27 ENCOUNTER — Ambulatory Visit
Admission: RE | Admit: 2016-09-27 | Discharge: 2016-09-27 | Disposition: A | Discharge status: Home / Self Care | Payer: PPO | Source: Ambulatory Visit | Attending: Radiation Oncology | Admitting: Radiation Oncology

## 2016-09-27 DIAGNOSIS — Z510 Encounter for antineoplastic radiation therapy: Secondary | ICD-10-CM | POA: Diagnosis not present

## 2016-09-27 DIAGNOSIS — C61 Malignant neoplasm of prostate: Secondary | ICD-10-CM | POA: Diagnosis not present

## 2016-09-28 ENCOUNTER — Encounter: Payer: Self-pay | Admitting: Radiation Oncology

## 2016-09-28 ENCOUNTER — Ambulatory Visit
Admission: RE | Admit: 2016-09-28 | Discharge: 2016-09-28 | Disposition: A | Discharge status: Home / Self Care | Payer: PPO | Source: Ambulatory Visit | Attending: Radiation Oncology | Admitting: Radiation Oncology

## 2016-09-28 DIAGNOSIS — C61 Malignant neoplasm of prostate: Secondary | ICD-10-CM | POA: Diagnosis not present

## 2016-09-28 DIAGNOSIS — Z510 Encounter for antineoplastic radiation therapy: Secondary | ICD-10-CM | POA: Diagnosis not present

## 2016-10-05 NOTE — Progress Notes (Signed)
  Radiation Oncology         [phone]) [phone] ________________________________  Name: Wesley White MRN: [id number]  Date: 09/28/2016  DOB: November 05, 1950  End of Treatment Note  Diagnosis:  66 y.o. gentleman status post prostatectomy with pT3b, pN0  adenocarcinoma of the prostate with a Gleason's score of 4+3 and a detectable PSA of 0.016  Indication for treatment:  Curative, Prostatic Fossa Radiotherapy       Radiation treatment dates:  08/08/2016 - 09/28/2016  Site/dose:   The prostatic fossa was treated to 68.4 Gy in 38 fractions of 1.8 Gy  Beams/energy:   The prostatic fossa was treated using helical intensity modulated radiotherapy delivering 6 megavolt photons. Image guidance was performed with megavoltage CT studies prior to each fraction. He was immobilized with a body fix lower extremity mold.  Narrative: The patient tolerated radiation treatment relatively well. The patient had diarrhea once or twice a day during the last week of treatment, an episodes of dysuria, infrequent urinary leakage associated with filling his bladder for radiation treatment, and nocturia x1.  Plan: The patient has completed radiation treatment. He will return to radiation oncology clinic for routine followup in one month. I advised him to call or return sooner if he has any questions or concerns related to his recovery or treatment. ________________________________  Sheral Apley. Tammi Klippel, M.D.  This document serves as a record of services personally performed by Tyler Pita, MD. It was created on his behalf by Darcus Austin, a trained medical scribe. The creation of this record is based on the scribe's personal observations and the provider's statements to them. This document has been checked and approved by the attending provider.

## 2016-11-02 DIAGNOSIS — C61 Malignant neoplasm of prostate: Secondary | ICD-10-CM | POA: Diagnosis not present

## 2016-11-09 DIAGNOSIS — C61 Malignant neoplasm of prostate: Secondary | ICD-10-CM | POA: Diagnosis not present

## 2016-11-09 DIAGNOSIS — N5201 Erectile dysfunction due to arterial insufficiency: Secondary | ICD-10-CM | POA: Diagnosis not present

## 2016-11-13 ENCOUNTER — Ambulatory Visit: Payer: Self-pay | Admitting: Urology

## 2016-11-21 ENCOUNTER — Ambulatory Visit
Admission: RE | Admit: 2016-11-21 | Discharge: 2016-11-21 | Disposition: A | Discharge status: Home / Self Care | Payer: PPO | Source: Ambulatory Visit | Attending: Radiation Oncology | Admitting: Radiation Oncology

## 2016-11-21 ENCOUNTER — Encounter: Payer: Self-pay | Admitting: Urology

## 2016-11-21 VITALS — BP 132/87 | HR 92 | Temp 98.2°F | Resp 20 | Ht 70.0 in | Wt 224.2 lb

## 2016-11-21 DIAGNOSIS — C61 Malignant neoplasm of prostate: Secondary | ICD-10-CM | POA: Diagnosis not present

## 2016-11-21 DIAGNOSIS — Z510 Encounter for antineoplastic radiation therapy: Secondary | ICD-10-CM | POA: Insufficient documentation

## 2016-11-21 NOTE — Progress Notes (Signed)
Radiation Oncology         [phone]) [phone] ________________________________  Name: Dontreal Miera MRN: [id number]  Date: 11/21/2016  DOB: 04/17/1951  Post Treatment Note  CC: [name], MD  Raynelle Bring, MD  Diagnosis:   66 y.o. gentleman status post prostatectomy with pT3b, pN0  adenocarcinoma of the prostate with a Gleason's score of 4+3 and a detectable PSA of 0.016  Interval Since Last Radiation:  7.5 weeks  08/08/2016 - 09/28/2016:  The prostatic fossa was treated to 68.4 Gy in 38 fractions of 1.8 Gy  Narrative:  The patient returns today for routine follow-up.  He reported diarrhea once or twice a day during the last week of treatment, and episodes of dysuria, infrequent urinary leakage associated with filling his bladder for radiation treatment, and nocturia x1.                             On review of systems, the patient states that he has recovered well from the effects of radiotherapy. He reports resolution of lower urinary tract symptoms and specifically denies dysuria, gross hematuria, excessive urinary frequency, urgency or incontinence. He reports a normal bowel movement daily and denies abdominal pain, nausea or vomiting. He reports that his appetite is healthy and he is maintaining his weight. He denies any significant fatigue.  ALLERGIES:  has No Known Allergies.  Meds: Current Outpatient Prescriptions  Medication Sig Dispense Refill  . CIALIS 5 MG tablet Take 5 mg by mouth daily.  11  . acetaminophen (TYLENOL) 325 MG tablet Take 650 mg by mouth every 6 (six) hours as needed for mild pain.     No current facility-administered medications for this encounter.    Facility-Administered Medications Ordered in Other Encounters  Medication Dose Route Frequency Provider Last Rate Last Dose  . magnesium citrate solution 1 Bottle  1 Bottle Oral Once Raynelle Bring, MD      . sodium phosphate (FLEET) 7-19 GM/118ML enema 1 enema  1 enema Rectal Once Raynelle Bring, MD         Physical Findings:  height is 5\' 10"  (1.778 m) and weight is 224 lb 3.2 oz (101.7 kg). His oral temperature is 98.2 F (36.8 C). His blood pressure is 132/87 and his pulse is 92. His respiration is 20 and oxygen saturation is 97%.  Pain Assessment Pain Score: 0-No pain/10 In general this is a well appearing Caucasian male in no acute distress. He's alert and oriented x4 and appropriate throughout the examination. Cardiopulmonary assessment is negative for acute distress and he exhibits normal effort.   Lab Findings: Lab Results  Component Value Date   WBC 5.8 02/15/2016   HGB 14.4 03/06/2016   HCT 40.7 03/06/2016   MCV 91.7 02/15/2016   PLT 224 02/15/2016     Radiographic Findings: No results found.  Impression/Plan: 1. 66 y.o. gentleman status post prostatectomy with pT3b, pN0  adenocarcinoma of the prostate with a Gleason's score of 4+3 and a detectable PSA of 0.016.  He had a follow-up appointment with Dr. Alinda Money last week and PSA was noted to be 0.03. He will continue to follow up with urology for ongoing PSA determinations and has an appointment scheduled with Dr. Alinda Money in 6 months. He understands what to expect with regards to PSA monitoring going forward. I will look forward to following his response to correspondence with urology, and would be happy to continue to participate in his care if  clinically indicated. I talked to the patient about what to expect in the future, including his risk for erectile dysfunction rectal bleeding. I encouraged him to call or return to the office if he has any questions regarding his previous radiation or possible radiation side effects. He was comfortable with this plan and will follow up as needed.    Nicholos Johns, PA-C

## 2017-01-14 ENCOUNTER — Telehealth: Payer: Self-pay | Admitting: Medical Oncology

## 2017-01-14 NOTE — Telephone Encounter (Signed)
3 month post radiation follow up call to Wesley White. He states he is back to "normal" for him. He states that he has a friend recently diagnosed with prostate cancer and he was able to share the information he learned in the Prostate [place] with his friend. He was thankful for the clinic that helped him chose the correct treatment for him. He continues follow up under Dr. Lynne Logan care at [facility].

## 2017-01-15 DIAGNOSIS — G4733 Obstructive sleep apnea (adult) (pediatric): Secondary | ICD-10-CM | POA: Diagnosis not present

## 2017-04-25 ENCOUNTER — Encounter: Payer: Self-pay | Admitting: Family Medicine

## 2017-04-25 ENCOUNTER — Ambulatory Visit: Payer: PPO | Admitting: Family Medicine

## 2017-04-25 VITALS — BP 110/70 | HR 71 | Resp 16 | Wt 224.2 lb

## 2017-04-25 DIAGNOSIS — G44209 Tension-type headache, unspecified, not intractable: Secondary | ICD-10-CM | POA: Diagnosis not present

## 2017-04-25 DIAGNOSIS — Z23 Encounter for immunization: Secondary | ICD-10-CM

## 2017-04-25 NOTE — Patient Instructions (Signed)
Proper posturing is the key.  Also when he had difficulty then do some gentle stretching and range of motion exercises and take some Advil or Aleve or even Tylenol

## 2017-04-25 NOTE — Progress Notes (Signed)
   frrffrr    Patient ID: Wesley White, male    DOB: 25-Nov-1950, 67 y.o.   MRN: [id number]  HPI He is here for evaluation of left occipital discomfort and what he describes as swelling in this area.  This usually occurs later on in the morning or early afternoon.  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  Apparently he also uses a computer.  No numbness, tingling or weakness.   Review of Systems     Objective:   Physical Exam Alert and in no distress.  Slight tenderness to palpation in the left lateral occipital area with normal motion of the neck.  Normal strength and sensation in the arms.       Assessment & Plan:  Tension headache  Need for influenza vaccination - Plan: Flu vaccine HIGH DOSE PF (Fluzone High dose)  Need for vaccination against Streptococcus pneumoniae - Plan: Pneumococcal polysaccharide vaccine 23-valent greater than or equal to 2yo subcutaneous/IM  I explained that this is most likely a muscle tension type headache due to posturing.  Discussed proper posturing with his head neck and back.  As well as stretching exercises and to use a anti-inflammatory of choice.  He will call if further difficulty. His immunizations were updated including giving information about Shingrix.

## 2017-05-14 ENCOUNTER — Ambulatory Visit: Payer: PPO | Admitting: Family Medicine

## 2017-06-07 DIAGNOSIS — C61 Malignant neoplasm of prostate: Secondary | ICD-10-CM | POA: Diagnosis not present

## 2017-06-26 DIAGNOSIS — N5201 Erectile dysfunction due to arterial insufficiency: Secondary | ICD-10-CM | POA: Diagnosis not present

## 2017-06-26 DIAGNOSIS — C61 Malignant neoplasm of prostate: Secondary | ICD-10-CM | POA: Diagnosis not present

## 2017-07-04 ENCOUNTER — Encounter: Payer: Self-pay | Admitting: Family Medicine

## 2017-07-04 ENCOUNTER — Ambulatory Visit (INDEPENDENT_AMBULATORY_CARE_PROVIDER_SITE_OTHER): Payer: PPO | Admitting: Family Medicine

## 2017-07-04 VITALS — BP 128/76 | HR 84 | Ht 68.0 in | Wt 224.0 lb

## 2017-07-04 DIAGNOSIS — C61 Malignant neoplasm of prostate: Secondary | ICD-10-CM | POA: Diagnosis not present

## 2017-07-04 DIAGNOSIS — Z13220 Encounter for screening for lipoid disorders: Secondary | ICD-10-CM | POA: Diagnosis not present

## 2017-07-04 DIAGNOSIS — Z9989 Dependence on other enabling machines and devices: Secondary | ICD-10-CM

## 2017-07-04 DIAGNOSIS — R7303 Prediabetes: Secondary | ICD-10-CM

## 2017-07-04 DIAGNOSIS — G4733 Obstructive sleep apnea (adult) (pediatric): Secondary | ICD-10-CM | POA: Diagnosis not present

## 2017-07-04 DIAGNOSIS — E669 Obesity, unspecified: Secondary | ICD-10-CM

## 2017-07-04 DIAGNOSIS — Z87442 Personal history of urinary calculi: Secondary | ICD-10-CM

## 2017-07-04 DIAGNOSIS — Z0000 Encounter for general adult medical examination without abnormal findings: Secondary | ICD-10-CM | POA: Diagnosis not present

## 2017-07-04 NOTE — Patient Instructions (Signed)
  Mr. Wesley White , Thank you for taking time to come for your Medicare Wellness Visit. I appreciate your ongoing commitment to your health goals. Please review the following plan we discussed and let me know if I can assist you in the future.   These are the goals we discussed: Goals    None      This is a list of the screening recommended for you and due dates:  Health Maintenance  Topic Date Due  . Pneumonia vaccines (2 of 2 - PCV13) 04/25/2018  . Tetanus Vaccine  03/17/2022  . Colon Cancer Screening  04/17/2022  . Flu Shot  Completed  .  Hepatitis C: One time screening is recommended by Center for Disease Control  (CDC) for  adults born from [birth year] through 1965.   Completed

## 2017-07-04 NOTE — Addendum Note (Signed)
Addended by: [name] on: 07/04/2017 03:01 PM   Modules accepted: Orders

## 2017-07-04 NOTE — Progress Notes (Signed)
Wesley White is a 67 y.o. male who presents for annual wellness visit,CPE and follow-up on chronic medical conditions.  He does have prostate cancer and has had robotic surgery as well as radiation.  He is followed by urology and his last PSA was less than 0.01 within the last several months.  He also has OSA and presently is on CPAP.  He gets routine readouts and notes a definite improvement in his energy and stamina.  He has remote history of kidney stones none recent.  His weight is unchanged and he does exercise fairly regularly.  He also has a history of glucose intolerance.  He is retired in keeping himself quite busy.  His home life is going well.   Immunizations and Health Maintenance Immunization History  Administered Date(s) Administered  . Influenza, High Dose Seasonal PF 04/25/2017  . Influenza,inj,Quad PF,6+ Mos 03/06/2016  . Pneumococcal Polysaccharide-23 03/06/2016, 04/25/2017  . Tdap 03/17/2012  . Zoster 03/17/2012   There are no preventive care reminders to display for this patient.  Last colonoscopy: 2013 Last PSA: few week ago  Dentist: three months ago Ophtho: year and half ago Exercise: walking every day for about an avg. Of 10 min.  Other doctors caring for patient include:[facility]  Advanced Directives:yes copy asked for    Depression screen:  See questionnaire below.     Depression screen [facility] 2/9 11/21/2016 02/01/2016 03/17/2012  Decreased Interest 0 0 1  Down, Depressed, Hopeless 0 0 1  PHQ - 2 Score 0 0 2    Fall Screen: See Questionaire below.   Fall Risk  11/21/2016 02/01/2016  Falls in the past year? No No    ADL screen:  See questionnaire below.  Functional Status Survey:     Review of Systems  Constitutional: -, -unexpected weight change, -anorexia, -fatigue Allergy: -sneezing, -itching, -congestion Dermatology: denies changing moles, rash, lumps ENT: -runny nose, -ear pain, -sore throat,  Cardiology:  -chest pain, -palpitations,  -orthopnea, Respiratory: -cough, -shortness of breath, -dyspnea on exertion, -wheezing,  Gastroenterology: -abdominal pain, -nausea, -vomiting, -diarrhea, -constipation, -dysphagia Hematology: -bleeding or bruising problems Musculoskeletal: -arthralgias, -myalgias, -joint swelling, -back pain, - Ophthalmology: -vision changes,  Urology: -dysuria, -difficulty urinating,  -urinary frequency, -urgency, incontinence Neurology: -, -numbness, , -memory loss, -falls, -dizziness    PHYSICAL EXAM:   General Appearance: Alert, cooperative, no distress, appears stated age Head: Normocephalic, without obvious abnormality, atraumatic Eyes: PERRL, conjunctiva/corneas clear, EOM's intact, fundi benign Ears: Normal TM's and external ear canals Nose: Nares normal, mucosa normal, no drainage or sinus   tenderness Throat: Lips, mucosa, and tongue normal; teeth and gums normal Neck: Supple, no lymphadenopathy, thyroid:no enlargement/tenderness/nodules; no carotid bruit or JVD Lungs: Clear to auscultation bilaterally without wheezes, rales or ronchi; respirations unlabored Heart: Regular rate and rhythm, S1 and S2 normal, no murmur, rub or gallop Abdomen: Soft, non-tender, nondistended, normoactive bowel sounds, no masses, no hepatosplenomegaly Extremities: No clubbing, cyanosis or edema Pulses: 2+ and symmetric all extremities Skin: Skin color, texture, turgor normal, no rashes or lesions Lymph nodes: Cervical, supraclavicular, and axillary nodes normal Neurologic: CNII-XII intact, normal strength, sensation and gait; reflexes 2+ and symmetric throughout   Psych: Normal mood, affect, hygiene and grooming  ASSESSMENT/PLAN: Routine general medical examination at a health care facility  Prostate cancer ([place])  Prediabetes  OSA on CPAP  Obesity (BMI 30.0-34.9)  History of kidney stones   Immunization recommendations discussed.  Colonoscopy recommendations reviewed. Recommend that he get the  Shingrix vaccine.  We will also review  his pneumococcal shot record. Encouraged him to increase his physical activity as well as making further change in his diet especially in regard to carbohydrates.  He states that he is essentially vegetarian Medicare Attestation I have personally reviewed: The patient's medical and social history Their use of alcohol, tobacco or illicit drugs Their current medications and supplements The patient's functional ability including ADLs,fall risks, home safety risks, cognitive, and hearing and visual impairment Diet and physical activities Evidence for depression or mood disorders  The patient's weight, height, and BMI have been recorded in the chart.  I have made referrals, counseling, and provided education to the patient based on review of the above and I have provided the patient with a written personalized care plan for preventive services.     Jill Alexanders, MD   07/04/2017

## 2017-07-05 LAB — CBC WITH DIFFERENTIAL/PLATELET
Basophils Absolute: 0 10*3/uL (ref 0.0–0.2)
Basos: 0 %
EOS (ABSOLUTE): 0.1 10*3/uL (ref 0.0–0.4)
Eos: 1 %
Hematocrit: 49.5 % (ref 37.5–51.0)
Hemoglobin: 16.8 g/dL (ref 13.0–17.7)
Immature Grans (Abs): 0 10*3/uL (ref 0.0–0.1)
Immature Granulocytes: 0 %
Lymphocytes Absolute: 1.3 10*3/uL (ref 0.7–3.1)
Lymphs: 21 %
MCH: 31.9 pg (ref 26.6–33.0)
MCHC: 33.9 g/dL (ref 31.5–35.7)
MCV: 94 fL (ref 79–97)
Monocytes Absolute: 0.6 10*3/uL (ref 0.1–0.9)
Monocytes: 10 %
Neutrophils Absolute: 4.1 10*3/uL (ref 1.4–7.0)
Neutrophils: 68 %
Platelets: 236 10*3/uL (ref 150–379)
RBC: 5.27 x10E6/uL (ref 4.14–5.80)
RDW: 14.4 % (ref 12.3–15.4)
WBC: 6 10*3/uL (ref 3.4–10.8)

## 2017-07-05 LAB — COMPREHENSIVE METABOLIC PANEL
ALT: 71 IU/L — ABNORMAL HIGH (ref 0–44)
AST: 42 IU/L — ABNORMAL HIGH (ref 0–40)
Albumin/Globulin Ratio: 1.6 (ref 1.2–2.2)
Albumin: 4.6 g/dL (ref 3.6–4.8)
Alkaline Phosphatase: 86 IU/L (ref 39–117)
BUN/Creatinine Ratio: 19 (ref 10–24)
BUN: 16 mg/dL (ref 8–27)
Bilirubin Total: 0.6 mg/dL (ref 0.0–1.2)
CO2: 23 mmol/L (ref 20–29)
Calcium: 9.7 mg/dL (ref 8.6–10.2)
Chloride: 101 mmol/L (ref 96–106)
Creatinine, Ser: 0.84 mg/dL (ref 0.76–1.27)
GFR calc Af Amer: 105 mL/min/{1.73_m2} (ref >59)
GFR calc non Af Amer: 91 mL/min/{1.73_m2} (ref >59)
Globulin, Total: 2.9 g/dL (ref 1.5–4.5)
Glucose: 105 mg/dL — ABNORMAL HIGH (ref 65–99)
Potassium: 5.1 mmol/L (ref 3.5–5.2)
Sodium: 141 mmol/L (ref 134–144)
Total Protein: 7.5 g/dL (ref 6.0–8.5)

## 2017-07-05 LAB — LIPID PANEL
Chol/HDL Ratio: 5 ratio (ref 0.0–5.0)
Cholesterol, Total: 233 mg/dL — ABNORMAL HIGH (ref 100–199)
HDL: 47 mg/dL (ref >39)
LDL Calculated: 159 mg/dL — ABNORMAL HIGH (ref 0–99)
Triglycerides: 134 mg/dL (ref 0–149)
VLDL Cholesterol Cal: 27 mg/dL (ref 5–40)

## 2017-11-25 ENCOUNTER — Ambulatory Visit (INDEPENDENT_AMBULATORY_CARE_PROVIDER_SITE_OTHER): Payer: PPO | Admitting: Family Medicine

## 2017-11-25 ENCOUNTER — Encounter: Payer: Self-pay | Admitting: Family Medicine

## 2017-11-25 VITALS — BP 110/70 | HR 73 | Temp 98.3°F | Resp 16 | Wt 228.6 lb

## 2017-11-25 DIAGNOSIS — J029 Acute pharyngitis, unspecified: Secondary | ICD-10-CM

## 2017-11-25 LAB — POCT RAPID STREP A (OFFICE): Rapid Strep A Screen: NEGATIVE

## 2017-11-25 NOTE — Patient Instructions (Signed)
Your strep test is negative.  I suspect your symptoms are related to a viral illness and recommend treating your symptoms at this point.  Mucinex DM for congestion and cough, drink extra water, use salt water gargles for throat irritation and Tylenol or Ibuprofen for aches and pains.  Call if you are not improving by days 7-10 of your illness or if you develop fever, wheezing or worsening symptoms.

## 2017-11-25 NOTE — Progress Notes (Signed)
Subjective:  Wesley White is a 67 y.o. male who presents for evaluation of a 4 day history of sore throat.  He has not had a recent close exposure to someone with proven streptococcal pharyngitis.  Associated symptoms include nasal congestion, headache, fatigue, low grade fever initially but this has resolved. Notes gradual improvement.    Denies chills, rhinorrhea, ear pain, post nasal drainage, cough, chest pain, palpitations, shortness of breath, abdominal pain, N/V/D.   Does not smoke. No recent antibiotics.   Treatment to date: decongestants and tylenol and Sudafed.  + sick contacts.  No other aggravating or relieving factors.  No other c/o.  The following portions of the patient's history were reviewed and updated as appropriate: allergies, current medications, past medical history, past social history, past surgical history and problem list.  ROS as in subjective   Objective: Vitals:   11/25/17 1341  BP: 110/70  Pulse: 73  Resp: 16  Temp: 98.3 F (36.8 C)  SpO2: 95%    General appearance: no distress, WD/WN, mildly ill-appearing HEENT: normocephalic, conjunctiva/corneas normal, sclerae anicteric, nares patent, no discharge or erythema, pharynx with erythema, no exudate.  Oral cavity: MMM, no lesions  Neck: supple, no lymphadenopathy, no thyromegaly Heart: RRR, normal S1, S2, no murmurs Lungs: CTA bilaterally, no wheezes, rhonchi, or rales   Laboratory Strep test done. Results:negative.    Assessment and Plan: Acute pharyngitis, unspecified etiology  Sore throat - Plan: POCT rapid strep A   Advised that symptoms and exam suggest a viral etiology.  Discussed symptomatic treatment including salt water gargles, warm fluids, rest, hydrate well, can use over-the-counter Tylenol or ibuprofen for throat pain, fever, or malaise. If worse or not improving within 2-3 days, call or return.

## 2017-12-31 DIAGNOSIS — C61 Malignant neoplasm of prostate: Secondary | ICD-10-CM | POA: Diagnosis not present

## 2018-01-07 DIAGNOSIS — N5201 Erectile dysfunction due to arterial insufficiency: Secondary | ICD-10-CM | POA: Diagnosis not present

## 2018-01-07 DIAGNOSIS — C61 Malignant neoplasm of prostate: Secondary | ICD-10-CM | POA: Diagnosis not present

## 2018-06-20 ENCOUNTER — Other Ambulatory Visit (INDEPENDENT_AMBULATORY_CARE_PROVIDER_SITE_OTHER): Payer: PPO

## 2018-06-20 DIAGNOSIS — Z23 Encounter for immunization: Secondary | ICD-10-CM | POA: Diagnosis not present

## 2018-07-03 DIAGNOSIS — C61 Malignant neoplasm of prostate: Secondary | ICD-10-CM | POA: Diagnosis not present

## 2018-07-09 DIAGNOSIS — C61 Malignant neoplasm of prostate: Secondary | ICD-10-CM | POA: Diagnosis not present

## 2018-10-15 DIAGNOSIS — C61 Malignant neoplasm of prostate: Secondary | ICD-10-CM | POA: Diagnosis not present

## 2018-10-17 ENCOUNTER — Other Ambulatory Visit: Payer: Self-pay

## 2018-10-17 ENCOUNTER — Ambulatory Visit (INDEPENDENT_AMBULATORY_CARE_PROVIDER_SITE_OTHER): Payer: PPO | Admitting: Medical

## 2018-10-17 ENCOUNTER — Encounter: Payer: Self-pay | Admitting: Medical

## 2018-10-17 ENCOUNTER — Ambulatory Visit
Admission: RE | Admit: 2018-10-17 | Discharge: 2018-10-17 | Disposition: A | Discharge status: Home / Self Care | Payer: PPO | Source: Ambulatory Visit | Attending: Medical | Admitting: Medical

## 2018-10-17 VITALS — BP 120/82 | HR 66 | Temp 99.1°F | Resp 16 | Ht 70.0 in | Wt 197.2 lb

## 2018-10-17 DIAGNOSIS — M545 Low back pain: Secondary | ICD-10-CM | POA: Diagnosis not present

## 2018-10-17 DIAGNOSIS — R634 Abnormal weight loss: Secondary | ICD-10-CM | POA: Insufficient documentation

## 2018-10-17 DIAGNOSIS — E669 Obesity, unspecified: Secondary | ICD-10-CM

## 2018-10-17 DIAGNOSIS — R29898 Other symptoms and signs involving the musculoskeletal system: Secondary | ICD-10-CM | POA: Diagnosis not present

## 2018-10-17 DIAGNOSIS — Z8546 Personal history of malignant neoplasm of prostate: Secondary | ICD-10-CM

## 2018-10-17 DIAGNOSIS — M5442 Lumbago with sciatica, left side: Secondary | ICD-10-CM | POA: Diagnosis not present

## 2018-10-17 DIAGNOSIS — E66811 Obesity, class 1: Secondary | ICD-10-CM

## 2018-10-17 DIAGNOSIS — R7303 Prediabetes: Secondary | ICD-10-CM | POA: Diagnosis not present

## 2018-10-17 DIAGNOSIS — R7309 Other abnormal glucose: Secondary | ICD-10-CM | POA: Diagnosis not present

## 2018-10-17 DIAGNOSIS — M25552 Pain in left hip: Secondary | ICD-10-CM

## 2018-10-17 DIAGNOSIS — M79605 Pain in left leg: Secondary | ICD-10-CM

## 2018-10-17 DIAGNOSIS — R7301 Impaired fasting glucose: Secondary | ICD-10-CM | POA: Diagnosis not present

## 2018-10-17 LAB — CBC WITH DIFFERENTIAL/PLATELET
Basophils Absolute: 0 10*3/uL (ref 0.0–0.2)
Basos: 1 %
EOS (ABSOLUTE): 0 10*3/uL (ref 0.0–0.4)
Eos: 1 %
Hematocrit: 47.8 % (ref 37.5–51.0)
Hemoglobin: 16.6 g/dL (ref 13.0–17.7)
Immature Grans (Abs): 0 10*3/uL (ref 0.0–0.1)
Immature Granulocytes: 0 %
Lymphocytes Absolute: 0.9 10*3/uL (ref 0.7–3.1)
Lymphs: 15 %
MCH: 31.9 pg (ref 26.6–33.0)
MCHC: 34.7 g/dL (ref 31.5–35.7)
MCV: 92 fL (ref 79–97)
Monocytes Absolute: 0.4 10*3/uL (ref 0.1–0.9)
Monocytes: 7 %
Neutrophils Absolute: 4.6 10*3/uL (ref 1.4–7.0)
Neutrophils: 76 %
Platelets: 244 10*3/uL (ref 150–450)
RBC: 5.21 x10E6/uL (ref 4.14–5.80)
RDW: 12.5 % (ref 11.6–15.4)
WBC: 6 10*3/uL (ref 3.4–10.8)

## 2018-10-17 LAB — COMPREHENSIVE METABOLIC PANEL
ALT: 19 IU/L (ref 0–44)
AST: 17 IU/L (ref 0–40)
Albumin/Globulin Ratio: 2 (ref 1.2–2.2)
Albumin: 4.7 g/dL (ref 3.8–4.8)
Alkaline Phosphatase: 338 IU/L — ABNORMAL HIGH (ref 39–117)
BUN/Creatinine Ratio: 14 (ref 10–24)
BUN: 11 mg/dL (ref 8–27)
Bilirubin Total: 0.7 mg/dL (ref 0.0–1.2)
CO2: 23 mmol/L (ref 20–29)
Calcium: 9.9 mg/dL (ref 8.6–10.2)
Chloride: 98 mmol/L (ref 96–106)
Creatinine, Ser: 0.78 mg/dL (ref 0.76–1.27)
GFR calc Af Amer: 108 mL/min/{1.73_m2} (ref >59)
GFR calc non Af Amer: 93 mL/min/{1.73_m2} (ref >59)
Globulin, Total: 2.4 g/dL (ref 1.5–4.5)
Glucose: 313 mg/dL — ABNORMAL HIGH (ref 65–99)
Potassium: 5.4 mmol/L — ABNORMAL HIGH (ref 3.5–5.2)
Sodium: 136 mmol/L (ref 134–144)
Total Protein: 7.1 g/dL (ref 6.0–8.5)

## 2018-10-17 MED ORDER — HYDROCODONE-ACETAMINOPHEN 5-325 MG PO TABS: 1.0000 | ORAL_TABLET | Freq: Four times a day (QID) | ORAL | 0 refills | Status: DC | PRN

## 2018-10-17 NOTE — Progress Notes (Signed)
Subjective: Chief Complaint  Patient presents with  . hip pain    left hip pain radiating down leg numbness X 3 weeks worse 5 days   Here for left hip pain.  Been having pain in left hip down leg to ankle.   Sometimes feels pain deep in hip.  Has sensation of leg being numb.  Sometimes sleeping it would hurt and throb down leg to ankle.   Has been having these symptoms for 6+ days, getting worse.  Using some Ibuprofen and Tylenol alternating.  No injury, no fall.  Has recently lost weight.  Since 05/2018 has lost 20lb. weight loss is unexpected.  He has been eating somewhat healthier but not enough he thinks he should have lost as much weight.  His wife is concerned about cancer.  Has hx/o prostatectomy 2017, had radiation follow up until 2018.  Seeing urology, most recently PSA has gone back up.  Sees Dr. Alinda Money at [facility] urology  He has no other complaint.  No abdominal pain no nausea no vomiting no blood in the stool or urine, no other bony pains, no change in appetite, no fever or chills.  He has had somewhat night sweats though.   Past Medical History:  Diagnosis Date  . Chronic kidney disease    hx kidney stone year ago  . Prediabetes   . Prostate cancer ([place])   . Sleep apnea    Current Outpatient Medications on File Prior to Visit  Medication Sig Dispense Refill  . acetaminophen (TYLENOL) 325 MG tablet Take 650 mg by mouth every 6 (six) hours as needed for mild pain.    [name] ibuprofen (ADVIL) 600 MG tablet Take 600 mg by mouth every 6 (six) hours as needed.    [name] CIALIS 5 MG tablet Take 5 mg by mouth daily.  11   Current Facility-Administered Medications on File Prior to Visit  Medication Dose Route Frequency Provider Last Rate Last Dose  . magnesium citrate solution 1 Bottle  1 Bottle Oral Once Raynelle Bring, MD      . sodium phosphate (FLEET) 7-19 GM/118ML enema 1 enema  1 enema Rectal Once Raynelle Bring, MD       ROS as in subjective    Objective: BP 120/82   Pulse 66    Temp 99.1 F (37.3 C) (Temporal)   Resp 16   Ht 5\' 10"  (1.778 m)   Wt 197 lb 3.2 oz (89.4 kg)   SpO2 96%   BMI 28.30 kg/m   Wt Readings from Last 3 Encounters:  10/17/18 197 lb 3.2 oz (89.4 kg)  11/25/17 228 lb 9.6 oz (103.7 kg)  07/04/17 224 lb (101.6 kg)   General appearance: alert, no distress, WD/WN, white male Neck: supple, no lymphadenopathy, no thyromegaly, no masses Heart: RRR, normal S1, S2, no murmurs Lungs: CTA bilaterally, no wheezes, rhonchi, or rales Abdomen: +bs, soft, non tender, non distended, no masses, no hepatomegaly, no splenomegaly Back: non tender Musculoskeletal: Tender over left buttock and hip, somewhat tender left upper thigh, pain with range of motion mostly with internal range of motion of left hip, otherwise nontender no swelling no other deformity.  Left leg unremarkable. No swelling no cyanosis Pulses 1+ pedal Back nontender, range of motion about 80% of normal due to lack of flexibility Neuro: He is generally weaker in the left leg compared to the right, normal sensation, DTRs are blunted throughout the body Otherwise neuro unremarkable non focal exam  Psychiatric: normal affect, behavior normal, pleasant  Assessment: Encounter Diagnoses  Name Primary?  . Left hip pain Yes  . Left leg pain   . Acute left-sided low back pain with left-sided sciatica   . Weakness of left lower extremity   . Weight loss, abnormal   . Prediabetes   . Obesity (BMI 30.0-34.9)   . History of prostate cancer     Plan: We discussed his concerns including unexpected weight loss, history of cancer, recent PSA level increasing followed by urology, and the hip and back pain.  We will send him for hip and back x-rays today, labs as below.  Will likely end up needing to get CTA chest abdomen pelvis given the weight loss  He can use alternating ibuprofen and Norco as below we can discussed risk and benefits of medication and proper use  Alex was seen today for  hip pain.  Diagnoses and all orders for this visit:  Left hip pain -     Comprehensive metabolic panel -     CBC with Differential/Platelet -     DG Hip Unilat W OR W/O Pelvis 2-3 Views Left; Future -     DG Lumbar Spine Complete; Future  Left leg pain -     Comprehensive metabolic panel -     CBC with Differential/Platelet -     DG Hip Unilat W OR W/O Pelvis 2-3 Views Left; Future -     DG Lumbar Spine Complete; Future  Acute left-sided low back pain with left-sided sciatica  Weakness of left lower extremity -     Comprehensive metabolic panel -     CBC with Differential/Platelet -     DG Hip Unilat W OR W/O Pelvis 2-3 Views Left; Future -     DG Lumbar Spine Complete; Future -     Lipase  Weight loss, abnormal -     Lipase  Prediabetes  Obesity (BMI 30.0-34.9) -     Lipase  History of prostate cancer  Other orders -     HYDROcodone-acetaminophen (NORCO) 5-325 MG tablet; Take 1 tablet by mouth every 6 (six) hours as needed for moderate pain.

## 2018-10-18 LAB — LIPASE: Lipase: 27 U/L (ref 13–78)

## 2018-10-22 DIAGNOSIS — C61 Malignant neoplasm of prostate: Secondary | ICD-10-CM | POA: Diagnosis not present

## 2018-10-24 ENCOUNTER — Ambulatory Visit (INDEPENDENT_AMBULATORY_CARE_PROVIDER_SITE_OTHER): Payer: PPO | Admitting: Family Medicine

## 2018-10-24 ENCOUNTER — Encounter: Payer: Self-pay | Admitting: Family Medicine

## 2018-10-24 ENCOUNTER — Other Ambulatory Visit: Payer: Self-pay

## 2018-10-24 VITALS — BP 138/90 | HR 79 | Temp 98.4°F | Wt 198.0 lb

## 2018-10-24 DIAGNOSIS — E119 Type 2 diabetes mellitus without complications: Secondary | ICD-10-CM | POA: Diagnosis not present

## 2018-10-24 DIAGNOSIS — E1169 Type 2 diabetes mellitus with other specified complication: Secondary | ICD-10-CM | POA: Diagnosis not present

## 2018-10-24 DIAGNOSIS — E785 Hyperlipidemia, unspecified: Secondary | ICD-10-CM | POA: Diagnosis not present

## 2018-10-24 DIAGNOSIS — C61 Malignant neoplasm of prostate: Secondary | ICD-10-CM

## 2018-10-24 MED ORDER — ROSUVASTATIN CALCIUM 20 MG PO TABS: 20.0000 mg | ORAL_TABLET | Freq: Every day | ORAL | 3 refills | Status: DC

## 2018-10-24 MED ORDER — CANAGLIFLOZIN 300 MG PO TABS: 300.0000 mg | ORAL_TABLET | Freq: Every day | ORAL | 1 refills | Status: DC

## 2018-10-24 MED ORDER — LISINOPRIL 5 MG PO TABS: 5.0000 mg | ORAL_TABLET | Freq: Every day | ORAL | 3 refills | Status: DC

## 2018-10-24 MED ORDER — METFORMIN HCL 500 MG PO TABS: 500.0000 mg | ORAL_TABLET | Freq: Two times a day (BID) | ORAL | 1 refills | Status: DC

## 2018-10-24 NOTE — Progress Notes (Signed)
   Subjective:    Patient ID: Wesley White, male    DOB: 10/25/1950, 68 y.o.   MRN: [id number]  HPI He is here for consult concerning recent blood work which did show an elevated blood sugar and subsequent hemoglobin A1c of 11.3.  He states that over the last year he has noted weight loss with some polydipsia but no polyuria.  He also complains of night sweats.  He is followed by Dr. Alinda Money for his underlying prostate cancer with evidence of possible metastasis.  He is being evaluated with bone scan and CT of abdomen and pelvis concerning this and the weight loss.   Review of Systems     Objective:   Physical Exam Alert and in no distress otherwise not examined       Assessment & Plan:  Hyperlipidemia associated with type 2 diabetes mellitus ([place]) - Plan: Lipid panel, rosuvastatin (CRESTOR) 20 MG tablet  New onset type 2 diabetes mellitus ([place]) - Plan: lisinopril (ZESTRIL) 5 MG tablet, metFORMIN (GLUCOPHAGE) 500 MG tablet, canagliflozin (INVOKANA) 300 MG TABS tablet  Prostate cancer ([place]) I discussed the new onset of diabetes with him in regard to diet, exercise, foot care, eye care, medications.  I discussed the medicines that he is on and possible side effects.  We will have him start to check his blood sugars either before meals or 2 hours after meal.  Information given concerning which websites to go to.  Also refer him to diabetes education.  Glucometer given with instructions on use.  He is to return here in 2 weeks to answer any questions that he has.  Over 25 minutes, the entire time spent in counseling and coordination of care.

## 2018-10-24 NOTE — Patient Instructions (Signed)
Go to the American diabetes Association website or go to [email or web address]

## 2018-10-25 LAB — LIPID PANEL
Chol/HDL Ratio: 4.1 ratio (ref 0.0–5.0)
Cholesterol, Total: 224 mg/dL — ABNORMAL HIGH (ref 100–199)
HDL: 55 mg/dL (ref >39)
LDL Calculated: 151 mg/dL — ABNORMAL HIGH (ref 0–99)
Triglycerides: 91 mg/dL (ref 0–149)
VLDL Cholesterol Cal: 18 mg/dL (ref 5–40)

## 2018-10-27 LAB — SPECIMEN STATUS REPORT

## 2018-10-27 LAB — HGB A1C W/O EAG: Hgb A1c MFr Bld: 11.3 % — ABNORMAL HIGH (ref 4.8–5.6)

## 2018-10-28 ENCOUNTER — Other Ambulatory Visit: Payer: Self-pay | Admitting: Urology

## 2018-10-28 ENCOUNTER — Other Ambulatory Visit (HOSPITAL_COMMUNITY): Payer: Self-pay | Admitting: Urology

## 2018-10-28 DIAGNOSIS — C61 Malignant neoplasm of prostate: Secondary | ICD-10-CM

## 2018-10-30 DIAGNOSIS — C61 Malignant neoplasm of prostate: Secondary | ICD-10-CM | POA: Diagnosis not present

## 2018-10-30 DIAGNOSIS — Z87442 Personal history of urinary calculi: Secondary | ICD-10-CM | POA: Diagnosis not present

## 2018-11-04 ENCOUNTER — Telehealth: Payer: Self-pay | Admitting: Family Medicine

## 2018-11-04 ENCOUNTER — Other Ambulatory Visit: Payer: Self-pay

## 2018-11-04 MED ORDER — ONETOUCH DELICA PLUS LANCET33G MISC: 1.0000 | 11 refills | Status: AC

## 2018-11-04 MED ORDER — GLUCOSE BLOOD VI STRP: 1.0000 | ORAL_STRIP | 11 refills | Status: DC | PRN

## 2018-11-04 NOTE — Telephone Encounter (Signed)
Done and pt advised KH 

## 2018-11-04 NOTE — Telephone Encounter (Signed)
Pt called for refills of lancets. His machine is one touch verio flex. Pt also wants to know how many time a day he should test. Please send to [facility]. He can be reached at [phone].

## 2018-11-06 ENCOUNTER — Other Ambulatory Visit: Payer: Self-pay

## 2018-11-06 ENCOUNTER — Ambulatory Visit (INDEPENDENT_AMBULATORY_CARE_PROVIDER_SITE_OTHER): Payer: PPO | Admitting: Family Medicine

## 2018-11-06 ENCOUNTER — Encounter: Payer: Self-pay | Admitting: Family Medicine

## 2018-11-06 VITALS — BP 112/78 | HR 83 | Temp 98.1°F | Wt 195.4 lb

## 2018-11-06 DIAGNOSIS — E119 Type 2 diabetes mellitus without complications: Secondary | ICD-10-CM | POA: Diagnosis not present

## 2018-11-06 NOTE — Progress Notes (Signed)
   Subjective:    Patient ID: Wesley White, male    DOB: Mar 11, 1951, 68 y.o.   MRN: [id number]  HPI He is here for recheck on his diabetes.  He has been checking his blood sugars 2 hours after meals and in general these numbers have been in the low 100s.  He is taking all of his medications and having no difficulty with them.  He is starting to exercise more.   Review of Systems     Objective:   Physical Exam Alert and in no distress otherwise not examined       Assessment & Plan:  New onset type 2 diabetes mellitus ([place]) - Plan: I encouraged him to continue to check his blood sugar 2 hours after meals.  He will continue on his present medications.  All of his questions were answered.  Recheck here in 3 months.

## 2018-11-07 ENCOUNTER — Ambulatory Visit: Payer: Self-pay | Admitting: Family Medicine

## 2018-11-07 ENCOUNTER — Encounter (HOSPITAL_COMMUNITY)
Admission: RE | Admit: 2018-11-07 | Discharge: 2018-11-07 | Disposition: A | Discharge status: Home / Self Care | Payer: PPO | Source: Ambulatory Visit | Attending: Urology | Admitting: Urology

## 2018-11-07 ENCOUNTER — Other Ambulatory Visit: Payer: Self-pay

## 2018-11-07 DIAGNOSIS — M898X8 Other specified disorders of bone, other site: Secondary | ICD-10-CM | POA: Diagnosis not present

## 2018-11-07 DIAGNOSIS — Z8546 Personal history of malignant neoplasm of prostate: Secondary | ICD-10-CM | POA: Diagnosis not present

## 2018-11-07 DIAGNOSIS — C61 Malignant neoplasm of prostate: Secondary | ICD-10-CM | POA: Insufficient documentation

## 2018-11-07 MED ORDER — TECHNETIUM TC 99M MEDRONATE IV KIT
20.0000 | PACK | Freq: Once | INTRAVENOUS | Status: AC | PRN
  Administered 2018-11-07: 21.9 via INTRAVENOUS

## 2018-11-14 ENCOUNTER — Telehealth: Payer: Self-pay | Admitting: Medical Oncology

## 2018-11-14 ENCOUNTER — Encounter: Payer: Self-pay | Admitting: Medical Oncology

## 2018-11-14 NOTE — Telephone Encounter (Signed)
Spoke with Mr. Wesley White to confirm he is aware of his referral to the [facility] 11/18/18. I met him in 2017 when he was consulted with Dr. Tammi Klippel. He had robotic prostatectomy 03/05/16 followed by radiation. He now has a rising PSA and a L iliac bone lesion. I discussed the format of the clinic and they physicians he will speak with. Due to COVID-19 restrictions, his visit will be held by telephone. I will call him on Monday 6/22 to discuss a time for the calls. He voiced understanding of the above.

## 2018-11-17 ENCOUNTER — Telehealth: Payer: Self-pay | Admitting: Radiation Oncology

## 2018-11-17 ENCOUNTER — Telehealth: Payer: Self-pay | Admitting: Medical Oncology

## 2018-11-17 NOTE — Telephone Encounter (Signed)
Confirmed appt and verified info. °

## 2018-11-17 NOTE — Telephone Encounter (Signed)
Attempted to reach Wesley White to confirm appointment 6/23 for [facility] but no mailbox setup.

## 2018-11-17 NOTE — Telephone Encounter (Signed)
Phoned Wesley White to confirm [id number] [facility] 6/23 1:00pm but no mailbox has been set up. I will try later.

## 2018-11-18 ENCOUNTER — Ambulatory Visit
Admission: RE | Admit: 2018-11-18 | Discharge: 2018-11-18 | Disposition: A | Discharge status: Home / Self Care | Payer: PPO | Source: Ambulatory Visit | Attending: Radiation Oncology | Admitting: Radiation Oncology

## 2018-11-18 ENCOUNTER — Encounter: Payer: Self-pay | Admitting: Medical Oncology

## 2018-11-18 ENCOUNTER — Inpatient Hospital Stay: Payer: PPO | Attending: Oncology | Admitting: Oncology

## 2018-11-18 DIAGNOSIS — Z923 Personal history of irradiation: Secondary | ICD-10-CM

## 2018-11-18 DIAGNOSIS — C61 Malignant neoplasm of prostate: Secondary | ICD-10-CM

## 2018-11-18 DIAGNOSIS — C7951 Secondary malignant neoplasm of bone: Secondary | ICD-10-CM | POA: Diagnosis not present

## 2018-11-18 DIAGNOSIS — R9721 Rising PSA following treatment for malignant neoplasm of prostate: Secondary | ICD-10-CM | POA: Diagnosis not present

## 2018-11-18 DIAGNOSIS — Z809 Family history of malignant neoplasm, unspecified: Secondary | ICD-10-CM | POA: Diagnosis not present

## 2018-11-18 DIAGNOSIS — Z9079 Acquired absence of other genital organ(s): Secondary | ICD-10-CM | POA: Diagnosis not present

## 2018-11-18 NOTE — Consult Note (Addendum)
Telehealth Visit     11/18/2018   --------------------------------------------------------------------------------   Wesley White  MRN: [id number]  DOB: April 04, 1951, 68 year old Male  SSN: [id number]   PRIMARY CARE:  [name], MD  REFERRING:  [name], MD  PROVIDER:  Raynelle Bring, M.D.  LOCATION:  [facility] [phone]     --------------------------------------------------------------------------------   CC/HPI: CC: Prostate Cancer   PCP: Dr. 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 His PSA undetectable postoperatively and he proceeded with adjuvant radiation therapy (68.4 Gy) from March through May 2018. His PSA was 0.048 in August 2018 but increased to 0.51 in February 2020 indicating biochemical recurrence. His PSA on 10/15/18 rapidly increased to 3.91. He was noted to have left sided hip that was new and radiated down his left lower extremity. Imaging was performed with a CT scan and bone scan for further evaluation.    Imaging studies:  CT abd/pelvis (10/30/18): Large sclerotic lesion of left iliac bone without other metastatic lesions.  Bone scan (11/07/18): Uptake at left iliac bone, uptake at right 7th rib at confirmed prior rib fracture site   PMH: He has a history of sleep apnea.     ALLERGIES: No Allergies    MEDICATIONS: Sildenafil Citrate 20 mg tablet 1-5 tablet PO PRN as directed     GU PSH: ESWL, Left - 2011 Laparoscopy; Lymphadenectomy - 2017 Locm 300-399Mg /Ml Iodine,1Ml - 10/30/2018, 2017 Prostate Needle Biopsy - 2017 Robotic Radical Prostatectomy - 2017     NON-GU PSH: Bmi >=30 Calcuate W/Followup - 2017 Doc Meds Verified W/Pt Or  Re - 2017 Inguinal Hernia Repair > 5 yrs - about 1983 Other Pt/Ot Current Status - 2017 Other Pt/Ot Goal Status - 2017 Pain Neg No Plan - 2017 Surgical Pathology, Gross And Microscopic Examination For Prostate Needle - 2017     GU PMH: ED due to arterial insufficiency - 2017 Prostate Cancer - 2017 Renal calculus - 2017 Ureteral calculus, Proximal Ureteral Stone On The Left - 2014      PMH Notes:   1) Prostate cancer: He is s/p a UNS RAL radical prostatectomy and BPLND on 03/05/2016. He underwent adjuvant radiation therapy (64.8 Gy) from March through May 2018.   Diagnosis: pT3b N0 Mx, Gleason 4+3=7 (tertiary pattern 5) with a positive surgical margins at the bladder neck  Pretreatment PSA: 10.42  Pretreatment SHIM: 20   NON-GU PMH: Sleep Apnea    FAMILY HISTORY: Bipolar Disorder - Daughter Death In The Family Father - Runs In Family Diabetes - Mother Family Health Status - Mother's Age - [place] is 1 daughter and 1 - Runs In [place] _4__ Living Daughter - Runs In Family Lung Cancer - Father Prostate Cancer - Brother, Runs in Family skin cancer - Father   SOCIAL HISTORY: Marital Status: Married Preferred Language: English; Ethnicity: Not Hispanic Or Latino; Race: White Current Smoking Status: Patient has never smoked.  Light Drinker.  Drinks 2 caffeinated drinks per day.    REVIEW OF SYSTEMS:    GU Review Male:   Patient denies frequent urination, hard to postpone urination, burning/ pain with urination,  get up at night to urinate, leakage of urine, stream starts and stops, trouble starting your streams, and have to strain to urinate .  Gastrointestinal (Lower):   Patient denies diarrhea and constipation.  Gastrointestinal (Upper):   Patient denies nausea and vomiting.  Constitutional:   Patient denies fever, night sweats, weight loss, and fatigue.  Skin:   Patient denies skin rash/ lesion and itching.   Eyes:   Patient denies blurred vision and double vision.  Ears/ Nose/ Throat:   Patient denies sore throat and sinus problems.  Hematologic/Lymphatic:   Patient denies swollen glands and easy bruising.  Cardiovascular:   Patient denies leg swelling and chest pains.  Respiratory:   Patient denies cough and shortness of breath.  Endocrine:   Patient denies excessive thirst.  Musculoskeletal:   Patient denies back pain and joint pain.  Neurological:   Patient denies headaches and dizziness.  Psychologic:   Patient denies depression and anxiety.   PAST DATA REVIEWED:  Source Of History:  Patient  Lab Test Review:   PSA  Records Review:   Pathology Reports, Previous Patient Records  X-Ray Review: C.T. Abdomen/Pelvis: Reviewed Films.  MRI Abdomen: Reviewed Films.  Bone Scan: Reviewed Films.     10/15/18 07/09/18 07/03/18 12/31/17 06/07/17 11/02/16 06/12/16 04/11/16  PSA  Total PSA 3.91 ng/mL 0.58 ng/mL 0.51 ng/mL 0.048 ng/mL 0.017 ng/mL 0.03 ng/dl 0.016 ng/dl < 0.015    Notes:                     CLINICAL DATA: 21.9 mCi Tc20m MDP LAC IV @ 1010 / BNL HX OF  PROSTATE CA PSA = 3.91 ON 10-15-18 PT DX W/ PROSTATE CANCER IN 2017  PROSTATECTOMY (2017) RADIATION TX COMPLETED 2018 PT HAS NO HX OF  BONE SX,FX OR TRAUMA   EXAM:  NUCLEAR MEDICINE WHOLE BODY BONE SCAN   TECHNIQUE:  Whole body anterior and posterior images were obtained approximately  3 hours after intravenous injection of radiopharmaceutical.   RADIOPHARMACEUTICALS: 21.2 mCi Technetium-33m MDP IV   COMPARISON: Bone scan 12/27/2015   FINDINGS:  A subtle lesion in the posterior RIGHT seventh rib is again noted.  This corresponds to rib fracture from radiograph 01/12/2016.   uptake within the LEFT iliac bone along the medial aspect is new  from comparison exam   IMPRESSION:  New radiotracer accumulation in the medial aspect of the LEFT iliac  bone most consistent with solitary skeletal metastasis. This  corresponds to  sclerotic lesion on comparison CT.    Electronically Signed  By: Suzy Bouchard M.D.  On: 11/07/2018 17:56   CLINICAL DATA: Status post prostatectomy in 2017 for prostate  cancer. Rising PSA and weight loss. History of nephrolithiasis.  Prior right inguinal hernia repair.   EXAM:  CT ABDOMEN AND PELVIS WITH CONTRAST   TECHNIQUE:  Multidetector CT imaging of the abdomen and pelvis was performed  using the standard protocol following bolus administration of  intravenous contrast.   CONTRAST: 100 cc Omnipaque 300 IV.   COMPARISON: 12/27/2015 CT pelvis. 12/09/2009 CT abdomen/pelvis.   FINDINGS:  Lower chest: No significant pulmonary nodules or acute consolidative  airspace disease.   Hepatobiliary: Normal liver size. Scattered simple liver cysts,  largest 3.2 cm in the lateral segment left liver lobe. Several  scattered subcentimeter hypodense liver lesions, too small to  characterize, some present on 2011 CT, although comparison is  difficult given absence of contrast on prior CT. Normal gallbladder  with no radiopaque cholelithiasis. No  biliary ductal dilatation.   Pancreas: Normal, with no mass or duct dilation.   Spleen: Normal size. No mass.   Adrenals/Urinary Tract: Normal adrenals. Normal kidneys with no  hydronephrosis and no renal mass. Normal bladder.   Stomach/Bowel: Normal non-distended stomach. Normal caliber small  bowel with no small bowel wall thickening. Normal appendix. Normal  large bowel with no diverticulosis, large bowel wall thickening or  pericolonic fat stranding.   Vascular/Lymphatic: Normal caliber abdominal aorta. Patent portal,  splenic, hepatic and renal veins. No pathologically enlarged lymph  nodes in the abdomen or pelvis.   Reproductive: Status post prostatectomy. No discrete mass or fluid  collection in the prostatectomy bed.   Other: No pneumoperitoneum, ascites or focal fluid collection.   Musculoskeletal: New large nonexpansile  sclerotic lesion in the  medial left iliac bone (series 2/image 57). No additional pathologic  osseous lesions. Mild lumbar spondylosis.   IMPRESSION:  1. New large nonexpansile sclerotic lesion in the medial left iliac  bone, compatible with skeletal metastasis.  2. No lymphadenopathy or other findings highly suspicious for  metastatic disease in the abdomen or pelvis.  3. Scattered subcentimeter hypodense liver lesions are too small to  characterize. Some of these were present on 2011 noncontrast CT.  Tiny benign liver cysts are most likely. Follow-up CT or MRI abdomen  in 3-6 months may be obtained as clinically warranted. This  recommendation follows ACR consensus guidelines: Managing Incidental  Findings on Abdominal CT: White Paper of the ACR Incidental Findings  Committee. J Am Coll Radiol 2010;7:754-773.    Electronically Signed  By: Ilona Sorrel M.D.  On: 10/30/2018 15:45   PROCEDURES:          Teleheatlh This patient encounter is appropriate and reasonable under the circumstances given the patient's particular presentation at this time. The patient has been advised of the potential risks and limitations of this mode of treatment (including, but not limited to, the absence of in-person examination) and has agreed to be treated in a remote fashion in spite of them.   Any and all of the patient's/patient's family's questions on this issue have been answered, and I have made no promises or guarantees to the patient. The patient has also been advised to contact this office for worsening conditions or problems, and seek emergency medical treatment and/or call 911 if the patient deems either necessary.    ASSESSMENT:      ICD-10 Details  1 GU:   Prostate Cancer - C61      PLAN:           Document Letter(s):  Created for Patient: Clinical Summary         Notes:   1. Metastatic prostate cancer: I reviewed his situation with him and again reviewed his recent imaging studies.  He understands that he does have oligometastatic prostate cancer. We discussed the issues of proceeding with systemic therapy. He understands that this would be noncurative but with serve the goals of delaying progression and improving his life expectancy. After reviewing his situation with [name], the plan will be to start androgen deprivation therapy. He will then follow up with [name] and will likely add abiraterone for initial treatment. We reviewed the side effects associated with androgen deprivation in detail today.   We also discussed his symptomatic iliac bone lesion. He was seen by Dr. Tammi Klippel today is going to be considered for palliative stereotactic beam radiation therapy in the near future.   He will be  scheduled for nurse visit to begin immediate androgen deprivation therapy with degarelix. I will notify [name] once he has had his initial injection so he can schedule him appropriately for follow-up. I will then defer to [name] for ongoing systemic therapy in be involved in Mr. Rodger care in anyway that would be helpful.   Cc: Dr. Jill Alexanders  Dr. Tyler Pita  Dr. Zola Button        Next Appointment:      Next Appointment: 01/29/2019 10:45 AM    Appointment Type: Laboratory Appointment    Location: [facility] [phone]    Provider: Lab LAB    Reason for Visit: psa-Derinda Bartus      E & M CODE: I spent at least 18 minutes face to face with the patient, more than 50% of that time was spent on counseling and/or coordinating care.

## 2018-11-18 NOTE — Progress Notes (Signed)
Radiation Oncology         [phone] ________________________________  Multidisciplinary Prostate Cancer Clinic  Initial Radiation Oncology Consultation - Conducted via Webex due to current COVID-19 concerns for limiting patient exposure  Name: Tory Septer MRN: [id number]  Date: 11/18/2018  DOB: Jul 19, 1950  RS:WNIOEVO, [name], MD  Raynelle Bring, MD   REFERRING PHYSICIAN: Raynelle Bring, MD  DIAGNOSIS: 68 y.o. gentleman with oligo metastatic prostate cancer with painful bony lesion in the left iliac bone.    ICD-10-CM   1. Malignant neoplasm of prostate metastatic to bone [facility])  C61    C79.51     HISTORY OF PRESENT ILLNESS::Master Pascal is a 68 y.o. gentleman with a history of prostate cancer, initially diagnosed in July 2017. He was originally seen for consultation in our clinic on 02/01/2016 and decided to proceed with prostatectomy.  He is s/p UNS RALP with BPLND on 03/05/2016 with Dr. Alinda Money and final pathology at that time revealed pT3bN0, Gleason 4+3 with tertiary pattern 5, adenocarcinoma involving both prostate lobes with extraprostatic extension, left seminal vesicle involvement, positive margin at the bladder neck, LVI and PNI. There was no evidence of carcinoma in 9 of 9 lymph nodes. His initial postop PSA was detectable at 0.016 and he proceeded with adjuvant prostate fossa IMRT from 08/08/2016 to 09/28/2016. He tolerated radiation treatment relatively well. Post-treatment PSA was 0.048 in 12/2016. He continued with ongoing PSA surveillance and was noted to have biochemical recurrence in February 2020 with a PSA of 0.51. Repeat PSA in May 2020 increased rapidly to 3.91. At that time, the patient mentioned new left-sided hip pain that radiated down the left lower extremity. He was evaluated with CT scan of the abdomen/pelvis on 10/30/2018 which identified a new large, non-expansile sclerotic lesion in the medial left iliac bone. No lymphadenopathy or other findings suspicious for  metastatic disease. Bone scan from 11/07/2018 showed radiotracer accumulation corresponding to the sclerotic lesion in the medial aspect of the left iliac bone, most consistent with solitary skeletal metastasis.  The patient reviewed the PSA and imaging results with his urologist and he has kindly been referred today to the multidisciplinary prostate cancer clinic for presentation of pathology and radiology studies in our conference for discussion of potential radiation treatment options and clinical evaluation.  PREVIOUS RADIATION THERAPY: Yes  08/08/2016 - 09/28/2016: The prostatic fossa was treated to 68.4 Gy in 38 fractions of 1.8 Gy  PAST MEDICAL HISTORY:  has a past medical history of Chronic kidney disease, Prediabetes, Prostate cancer ([place]), and Sleep apnea.    PAST SURGICAL HISTORY: Past Surgical History:  Procedure Laterality Date  . HERNIA REPAIR     1983 baland   . LITHOTRIPSY     2011 by tannenbaum  . LYMPHADENECTOMY Bilateral 03/05/2016   Procedure: PELVIC LYMPHADENECTOMY;  Surgeon: Raynelle Bring, MD;  Location: [facility];  Service: Urology;  Laterality: Bilateral;  . PROSTATE BIOPSY    . ROBOT ASSISTED LAPAROSCOPIC RADICAL PROSTATECTOMY N/A 03/05/2016   Procedure: XI ROBOTIC ASSISTED LAPAROSCOPIC RADICAL PROSTATECTOMY LEVEL 2;  Surgeon: Raynelle Bring, MD;  Location: [facility];  Service: Urology;  Laterality: N/A;    FAMILY HISTORY: family history includes Bipolar disorder in his daughter; Cancer (age of onset: 78) in his brother; Cancer (age of onset: 60) in his father; Diabetes in his mother; Hyperlipidemia in his mother; Hypertension in his mother.  SOCIAL HISTORY:  reports that he has never smoked. He has never used smokeless tobacco. He reports current alcohol use. He reports  that he does not use drugs.  ALLERGIES: Patient has no known allergies.  MEDICATIONS:  Current Outpatient Medications  Medication Sig Dispense Refill  . acetaminophen (TYLENOL) 325 MG tablet Take 650 mg by  mouth every 6 (six) hours as needed for mild pain.    . canagliflozin (INVOKANA) 300 MG TABS tablet Take 1 tablet (300 mg total) by mouth daily before breakfast. 90 tablet 1  . CIALIS 5 MG tablet Take 5 mg by mouth daily.  11  . glucose blood test strip 1 each by Other route as needed. Use as instructed 100 each 11  . HYDROcodone-acetaminophen (NORCO) 5-325 MG tablet Take 1 tablet by mouth every 6 (six) hours as needed for moderate pain. (Patient not taking: Reported on 10/24/2018) 15 tablet 0  . ibuprofen (ADVIL) 600 MG tablet Take 600 mg by mouth every 6 (six) hours as needed.    [name] lisinopril (ZESTRIL) 5 MG tablet Take 1 tablet (5 mg total) by mouth daily. 90 tablet 3  . metFORMIN (GLUCOPHAGE) 500 MG tablet Take 1 tablet (500 mg total) by mouth 2 (two) times daily with a meal. 180 tablet 1  . naproxen sodium (ALEVE) 220 MG tablet Take 220 mg by mouth.    . rosuvastatin (CRESTOR) 20 MG tablet Take 1 tablet (20 mg total) by mouth daily. 90 tablet 3   No current facility-administered medications for this encounter.    Facility-Administered Medications Ordered in Other Encounters  Medication Dose Route Frequency Provider Last Rate Last Dose  . magnesium citrate solution 1 Bottle  1 Bottle Oral Once Raynelle Bring, MD      . sodium phosphate (FLEET) 7-19 GM/118ML enema 1 enema  1 enema Rectal Once Raynelle Bring, MD        REVIEW OF SYSTEMS:  On review of systems, the patient reports that he is doing well overall. He denies any chest pain, shortness of breath, cough, fevers, chills, night sweats, unintended weight changes. He denies any bowel disturbances, and denies abdominal pain, nausea or vomiting. He reports left pelvic/hip pain that radiates into the left leg but denies any other new bone or joint pain.  A complete review of systems is obtained and is otherwise negative.   PHYSICAL EXAM:  Unable to assess due to telemedicine/telephone via WebEx consult visit format.  Wt Readings from Last 3  Encounters:  11/06/18 195 lb 6.4 oz (88.6 kg)  10/24/18 198 lb (89.8 kg)  10/17/18 197 lb 3.2 oz (89.4 kg)   Temp Readings from Last 3 Encounters:  11/06/18 98.1 F (36.7 C)  10/24/18 98.4 F (36.9 C)  10/17/18 99.1 F (37.3 C) (Temporal)   BP Readings from Last 3 Encounters:  11/06/18 112/78  10/24/18 138/90  10/17/18 120/82   Pulse Readings from Last 3 Encounters:  11/06/18 83  10/24/18 79  10/17/18 66    LABORATORY DATA:  Lab Results  Component Value Date   WBC 6.0 10/17/2018   HGB 16.6 10/17/2018   HCT 47.8 10/17/2018   MCV 92 10/17/2018   PLT 244 10/17/2018   Lab Results  Component Value Date   NA 136 10/17/2018   K 5.4 (H) 10/17/2018   CL 98 10/17/2018   CO2 23 10/17/2018   Lab Results  Component Value Date   ALT 19 10/17/2018   AST 17 10/17/2018   ALKPHOS 338 (H) 10/17/2018   BILITOT 0.7 10/17/2018     RADIOGRAPHY: Nm Bone Scan Whole Body  Result Date: 11/07/2018 CLINICAL DATA:  21.9  mCi Tc9m MDP LAC IV @ 1010 / BNL HX OF PROSTATE CA PSA = 3.91 ON 10-15-18 PT DX W/ PROSTATE CANCER IN 2017 PROSTATECTOMY (2017) RADIATION TX COMPLETED 2018 PT HAS NO HX OF BONE SX,FX OR TRAUMA EXAM: NUCLEAR MEDICINE WHOLE BODY BONE SCAN TECHNIQUE: Whole body anterior and posterior images were obtained approximately 3 hours after intravenous injection of radiopharmaceutical. RADIOPHARMACEUTICALS:  21.2 mCi Technetium-17m MDP IV COMPARISON:  Bone scan 12/27/2015 FINDINGS: A subtle lesion in the posterior RIGHT seventh rib is again noted. This corresponds to rib fracture from radiograph 01/12/2016. uptake within the LEFT iliac bone along the medial aspect is new from comparison exam IMPRESSION: New radiotracer accumulation in the medial aspect of the LEFT iliac bone most consistent with solitary skeletal metastasis. This corresponds to sclerotic lesion on comparison CT. Electronically Signed   By: Suzy Bouchard M.D.   On: 11/07/2018 17:56      IMPRESSION/PLAN: 68 y.o. gentleman  with oligo metastatic prostate cancer with painful bony lesion in the left iliac bone.  Today, we talked to the patient about the findings and workup thus far. We discussed the natural history of oligometastatic prostate carcinoma and general treatment, highlighting the role of palliative stereotactic body radiotherapy (SBRT) in the management. We discussed the available radiation techniques, and focused on the details of logistics and delivery. We reviewed the anticipated acute and late sequelae associated with radiation in this setting. The patient was encouraged to ask questions that were answered to his satisfaction.  At the end of the conversation the patient is interested in moving forward with SBRT to the left iliac bone lesion +/- ADT and chemotherapy.  The recommendation is for a 5 fraction course of SBRT to the painful bony lesion in the left iliac bone.  After further discussion with [name] and Dr. Alinda Money, the consensus recommendation is to proceed with starting ADT now, concurrent with his radiation, and adding Zytiga sequentially in the near future.  We will help to coordinate a follow-up visit with Dr. Alinda Money in the near future to start ADT prior to scheduling his CT simulation for treatment planning.    This encounter was provided by telemedicine platform Webex.  The patient has given verbal consent for this type of encounter and has been advised to only accept a meeting of this type in a secure network environment. The time spent during this encounter was 25 minutes. The attendants for this meeting include Tyler Pita MD, Ashlyn Bruning PA-C, scribe Freeman Caldron, and patient, Eryck Negron.  During the encounter, Tyler Pita MD, Ashlyn Bruning PA-C, and scribe Freeman Caldron were located at [facility] Radiation Oncology Department.  Patient, Garrit Marrow was located at home.      Nicholos Johns, PA-C    Tyler Pita, MD  [place]  [facility] Direct Dial: [phone]  Fax: [phone] [email or web address]  Skype  [email or web address]  This document serves as a record of services personally performed by Tyler Pita, MD and Freeman Caldron, PA-C. It was created on their behalf by Rae Lips, a trained medical scribe. The creation of this record is based on the scribe's personal observations and the providers' statements to them. This document has been checked and approved by the attending providers.

## 2018-11-18 NOTE — Progress Notes (Signed)
Hematology and Oncology Follow Up for Telemedicine Visits  Wesley White [id number] 1951-01-08 68 y.o. 11/18/2018 2:06 PM Wesley White, MDLalonde, [name], MD   I connected with [name] on 11/18/18 at  2:15 PM EDT by video enabled telemedicine visit and verified that I am speaking with the correct person using two identifiers.   I discussed the limitations, risks, security and privacy concerns of performing an evaluation and management service by telemedicine and the availability of in-person appointments. I also discussed with the patient that there may be a patient responsible charge related to this service. The patient expressed understanding and agreed to proceed.  Other persons participating in the visit and their role in the encounter: None  Patient's location: Home Provider's location: Office    Principle Diagnosis: 68 year old with prostate cancer diagnosed in July 2017.  At that time his PSA was 10.4 and a Gleason score 4+3 = 7.  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   Prior Therapy:   He is status post laparoscopic radical prostatectomy and pelvic lymphadenectomy on March 05, 2016.  The final pathology showed Gleason score 4+3 equal 7 with 0 out of 9 lymph node involved.  He did have extraprostatic extension at that time.   He is status post radiation therapy adjuvantly under the care of Dr. Tammi Klippel completed on May 4 of 2018.  He received 68.4 Gray in 38 fractions.   Current therapy: Under evaluation for additional therapy.  Interim History: [name] reports increased pain in his hip that has developed in the last few weeks.  He was evaluated by Dr. Alinda Money and found to have an elevated PSA up to 3.91 on Oct 15, 2018.  Is PSA nadir has been at 0.015 in November 2017 and has risen up to 0.03 in May 2018.  His PSA was 0.017 in 2019 and then 0.5 in February 2020.  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  No evidence of diffuse metastasis noted.  CT scan of the abdomen pelvis did not show any evidence of lymphadenopathy.  He reports his left hip pain is manageable at this time he does use hydrocodone for pain but still ambulates without any difficulties.  Denies any urinary symptoms or any other constitutional plaints.     Medications: I have reviewed the patient's current medications.  Current Outpatient Medications  Medication Sig Dispense Refill  . acetaminophen (TYLENOL) 325 MG tablet Take 650 mg by mouth every 6 (six) hours as needed for mild pain.    . canagliflozin (INVOKANA) 300 MG TABS tablet Take 1 tablet (300 mg total) by mouth daily before breakfast. 90 tablet 1  . CIALIS 5 MG tablet Take 5 mg by mouth daily.  11  . glucose blood test strip 1 each by Other route as needed. Use as instructed 100 each 11  . HYDROcodone-acetaminophen (NORCO) 5-325 MG tablet Take 1 tablet by mouth every 6 (six) hours as needed for moderate pain. (Patient not taking: Reported on 10/24/2018) 15 tablet 0  . ibuprofen (ADVIL) 600 MG tablet Take 600 mg by mouth every 6 (six) hours as needed.    [name] lisinopril (ZESTRIL) 5 MG tablet Take 1 tablet (5 mg total) by mouth daily. 90 tablet 3  . metFORMIN (GLUCOPHAGE) 500 MG tablet Take 1 tablet (500 mg total) by mouth 2 (two) times daily with a meal. 180 tablet 1  .  naproxen sodium (ALEVE) 220 MG tablet Take 220 mg by mouth.    . rosuvastatin (CRESTOR) 20 MG tablet Take 1 tablet (20 mg total) by mouth daily. 90 tablet 3   No current facility-administered medications for this visit.    Facility-Administered Medications Ordered in Other Visits  Medication Dose Route Frequency Provider Last Rate Last Dose  . magnesium citrate solution 1 Bottle  1 Bottle Oral Once Raynelle Bring, MD      . sodium phosphate (FLEET) 7-19 GM/118ML enema 1 enema  1 enema Rectal Once Raynelle Bring, MD          Allergies: No Known Allergies  Past Medical History, Surgical history, Social history, and Family History were reviewed and updated.  Review of Systems:  Remaining ROS negative.  Physical Exam: There were no vitals taken for this visit. ECOG:  General appearance: alert and cooperative appeared without distress. Head: Normocephalic, without obvious abnormality Oropharynx: No oral thrush or ulcers. Eyes: No scleral icterus.  Pupils are equal and round reactive to light. Lymph nodes: Cervical, supraclavicular, and axillary nodes normal. Heart:regular rate and rhythm, S1, S2 normal, no murmur, click, rub or gallop White:chest clear, no wheezing, rales, normal symmetric air entry Abdomin: soft, non-tender, without masses or organomegaly. Neurological: No motor, sensory deficits.  Intact deep tendon reflexes. Skin: No rashes or lesions.  No ecchymosis or petechiae. Musculoskeletal: No joint deformity or effusion. Psychiatric: Mood and affect are appropriate.    Lab Results: Lab Results  Component Value Date   WBC 6.0 10/17/2018   HGB 16.6 10/17/2018   HCT 47.8 10/17/2018   MCV 92 10/17/2018   PLT 244 10/17/2018     Chemistry      Component Value Date/Time   NA 136 10/17/2018 1139   K 5.4 (H) 10/17/2018 1139   CL 98 10/17/2018 1139   CO2 23 10/17/2018 1139   BUN 11 10/17/2018 1139   CREATININE 0.78 10/17/2018 1139   CREATININE 0.81 06/08/2015 0001      Component Value Date/Time   CALCIUM 9.9 10/17/2018 1139   ALKPHOS 338 (H) 10/17/2018 1139   AST 17 10/17/2018 1139   ALT 19 10/17/2018 1139   BILITOT 0.7 10/17/2018 1139       Radiological Studies:  CLINICAL DATA:  21.9 mCi Tc33m MDP LAC IV @ 1010 / BNL HX OF PROSTATE CA PSA = 3.91 ON 10-15-18 PT DX W/ PROSTATE CANCER IN 2017 PROSTATECTOMY (2017) RADIATION TX COMPLETED 2018 PT HAS NO HX OF BONE SX,FX OR TRAUMA  EXAM: NUCLEAR MEDICINE WHOLE BODY BONE SCAN  TECHNIQUE: Whole body anterior and posterior  images were obtained approximately 3 hours after intravenous injection of radiopharmaceutical.  RADIOPHARMACEUTICALS:  21.2 mCi Technetium-105m MDP IV  COMPARISON:  Bone scan 12/27/2015  FINDINGS: A subtle lesion in the posterior RIGHT seventh rib is again noted. This corresponds to rib fracture from radiograph 01/12/2016.  uptake within the LEFT iliac bone along the medial aspect is new from comparison exam  IMPRESSION: New radiotracer accumulation in the medial aspect of the LEFT iliac bone most consistent with solitary skeletal metastasis. This corresponds to sclerotic lesion on comparison CT.   Impression and Plan:   68 year old man with:  1.  Prostate cancer diagnosed in 2017.  He was found to have Gleason score 4+3 equal 7 and a PSA of 10.4.  He status post prostatectomy but developed castration-sensitive advanced disease in June 2020.  This case was discussed today in the prostate cancer multidisciplinary clinic including review of  his pathology reports as well as imaging studies and discussion with the reviewing pathologist and radiologist.  The natural course of this disease was reviewed today with the patient and treatment options were discussed.  Given the fact that he has oligometastatic disease combination of local and systemic therapy is needed.  Risks and benefits of adding androgen deprivation therapy at this time was reviewed.  Complication including weight gain, hot flashes and sexual dysfunction were reviewed.  The role of therapy escalation using Zytiga was discussed.  Complication associated with this therapy include weight gain, arthralgias, hypertension among others.  The rationale for using this medication was reviewed as well as duration of therapy.  I have recommended starting with androgen deprivation therapy alone at this time and adding Zytiga sequentially in the near future.  2.  Hip pain: Will be evaluated by Dr. Tammi Klippel for palliative radiation  therapy.  Pain is manageable at this time with hydrocodone.  3.  Follow-up: We will be in the next few weeks to discuss the start of Zytiga.  I discussed the assessment and treatment plan with the patient. The patient was provided an opportunity to ask questions and all were answered. The patient agreed with the plan and demonstrated an understanding of the instructions.   The patient was advised to call back or seek an in-person evaluation if the symptoms worsen or if the condition fails to improve as anticipated.  I provided 25 minutes of non face-to-face telephone visit time during this encounter, and > 50% was spent on reviewing his disease status, treatment options, complication related therapy and answering questions regarding future plan of care.  Zola Button, MD 11/18/2018 2:06 PM

## 2018-11-19 ENCOUNTER — Telehealth: Payer: Self-pay | Admitting: Oncology

## 2018-11-19 NOTE — Telephone Encounter (Signed)
Scheduled per sch msg. Called and spoke with patient. Confirmed date and time ° °

## 2018-11-20 ENCOUNTER — Other Ambulatory Visit: Payer: Self-pay

## 2018-11-20 ENCOUNTER — Encounter: Payer: Self-pay | Admitting: Registered"

## 2018-11-20 ENCOUNTER — Encounter: Payer: PPO | Attending: Family Medicine | Admitting: Registered"

## 2018-11-20 DIAGNOSIS — E119 Type 2 diabetes mellitus without complications: Secondary | ICD-10-CM | POA: Insufficient documentation

## 2018-11-20 DIAGNOSIS — C61 Malignant neoplasm of prostate: Secondary | ICD-10-CM | POA: Diagnosis not present

## 2018-11-20 DIAGNOSIS — Z5111 Encounter for antineoplastic chemotherapy: Secondary | ICD-10-CM | POA: Diagnosis not present

## 2018-11-20 NOTE — Progress Notes (Signed)
Diabetes Self-Management Education  Visit Type: First/Initial  Appt. Start Time: 1000 Appt. End Time: 1120  11/20/2018  Wesley White, identified by name and date of birth, is a 68 y.o. male with a diagnosis of Diabetes: Type 2.   ASSESSMENT  There were no vitals taken for this visit. There is no height or weight on file to calculate BMI.  Patient states he has stayed at preDM range for awhile and was told they would just watch it. Last month 5/22 A1c was 11.3%. From patient reported SMBG, he may be closer to 5.8% or lower now. Pt checks PPBG 2 hrs after dinner, averaging 120s. Recently PPBG usually doesn't go above 135 mg/dL. Patient has not been checking FBG. Pt states for awhile was frequently waking up in middle of night sweating profusely, episodes have decreased, but still has night sweats 1/10 days. RD asked him to check BG at these times.  Pt states he hasn't changed diet a lot, but cutting back some on carbs, small amt of cheese. Pt states over last 2 yrs has lost 75 lbs due to changing his diet.   Pt enjoys cooking, brought many pictures of sample meals he is eating, states he is mostly plant-based diet with fish 2x week and occasional chicken, rarely desserts. He has been using the [employer] which looks to have very healthy recipes. Pt states he feels very satisfied with this way of eating and is sustainable long term.  Pt states he uses CPAP, sleep well, usually gets 6-8 hrs.  Physical activity: Pt states he has never been motivated to exercise, doesn't see the benefit and wants to use his time dong other things. However once every year he takes a hiking trip in [place] and will get 36 mi of hiking during 1 week; enjoys scenery, solitude, taking pictures. Pt states he makes an effort to be active with [employer], parking farther away from shopping center, taking stairs, etc.   Diabetes Self-Management Education - 11/20/18 1012      Visit Information   Visit Type   First/Initial      Initial Visit   Diabetes Type  Type 2    Are you currently following a meal plan?  No    Are you taking your medications as prescribed?  Yes    Date Diagnosed  2020      Health Coping   How would you rate your overall health?  Excellent      Psychosocial Assessment   Patient Belief/Attitude about Diabetes  Motivated to manage diabetes    How often do you need to have someone help you when you read instructions, pamphlets, or other written materials from your doctor or pharmacy?  1 - Never    What is the last grade level you completed in school?  BA      Complications   Last HgB A1C per patient/outside source  11 %    How often do you check your blood sugar?  1-2 times/day    Postprandial Blood glucose range (mg/dL)  70-129;130-179   105-135   Number of hypoglycemic episodes per month  0    Number of hyperglycemic episodes per week  0    Have you had a dilated eye exam in the past 12 months?  Yes    Have you had a dental exam in the past 12 months?  Yes    Are you checking your feet?  No      Dietary Intake  Breakfast  eggs & vegetables, cappuchino at home    Snack (morning)  none    Lunch  salad    Snack (afternoon)  nuts    Dinner  salmon (2x week), vegetables    Snack (evening)  none OR once in awhile popcorn while watching movie    Beverage(s)  water, cappuchino, apple cider      Exercise   Exercise Type  ADL's    How many days per week to you exercise?  0    How many minutes per day do you exercise?  0    Total minutes per week of exercise  0      Patient Education   Previous Diabetes Education  No    Disease state   Definition of diabetes, type 1 and 2, and the diagnosis of diabetes    Nutrition management   Role of diet in the treatment of diabetes and the relationship between the three main macronutrients and blood glucose level;Reviewed blood glucose goals for pre and post meals and how to evaluate the patients' food intake on their blood  glucose level.    Physical activity and exercise   Role of exercise on diabetes management, blood pressure control and cardiac health.    Medications  Reviewed patients medication for diabetes, action, purpose, timing of dose and side effects.    Monitoring  Taught/discussed recording of test results and interpretation of SMBG.;Identified appropriate SMBG and/or A1C goals.    Acute complications  Taught treatment of hypoglycemia - the 15 rule.      Individualized Goals (developed by patient)   Nutrition  General guidelines for healthy choices and portions discussed    Physical Activity  Exercise 3-5 times per week    Monitoring   test my blood glucose as discussed    Reducing Risk  treat hypoglycemia with 15 grams of carbs if blood glucose less than 70mg /dL      Outcomes   Expected Outcomes  Demonstrated interest in learning. Expect positive outcomes    Future DMSE  PRN    Program Status  Completed       Individualized Plan for Diabetes Self-Management Training:   Learning Objective:  Patient will have a greater understanding of diabetes self-management. Patient education plan is to attend individual and/or group sessions per assessed needs and concerns.   Patient Instructions  Consider increasing your physical activity 3-5 x/week, resistance training is important for bone health. Now that you have a good idea how some of your meals are affecting your blood, don't need to check daily, but want to start checking some fasting numbers to make sure you are not having hypoglycemia. Continue eating balanced meals, snacks if hungry Consider signing up for the ADA year-long program  Expected Outcomes:  Demonstrated interest in learning. Expect positive outcomes  Education material provided: ADA - How to Thrive: A Guide for Your Journey with Diabetes, A1C conversion sheet and Carbohydrate counting sheet  If problems or questions, patient to contact team via:  Phone and Email  Future DSME  appointment: PRN

## 2018-11-20 NOTE — Patient Instructions (Addendum)
Consider increasing your physical activity 3-5 x/week, resistance training is important for bone health. Now that you have a good idea how some of your meals are affecting your blood, don't need to check daily, but want to start checking some fasting numbers to make sure you are not having hypoglycemia. Continue eating balanced meals, snacks hungry Consider signing up for the ADA year-long

## 2018-11-20 NOTE — Progress Notes (Signed)
  Radiation Oncology         [phone]) [phone] ________________________________  Name: Wesley White MRN: [id number]  Date: 11/21/2018  DOB: [birth year]  STEREOTACTIC BODY RADIOTHERAPY SIMULATION AND TREATMENT PLANNING NOTE    ICD-10-CM   1. Malignant neoplasm of prostate metastatic to bone ([place])  C61    C79.51     DIAGNOSIS:  68 y.o. gentleman with oligo metastatic prostate cancer with painful bony lesion in the left iliac bone  NARRATIVE:  The patient was brought to the [facility].  Identity was confirmed.  All relevant records and images related to the planned course of therapy were reviewed.  The patient freely provided informed written consent to proceed with treatment after reviewing the details related to the planned course of therapy. The consent form was witnessed and verified by the simulation staff.  Then, the patient was set-up in a stable reproducible  supine position for radiation therapy.  A BodyFix immobilization pillow was fabricated for reproducible positioning.  Surface markings were placed.  The CT images were loaded into the planning software.  The gross target volumes (GTV) and planning target volumes (PTV) were delinieated, and avoidance structures were contoured.  Treatment planning then occurred.  The radiation prescription was entered and confirmed.  A total of two complex treatment devices were fabricated in the form of the BodyFix immobilization pillow and a neck accuform cushion.  I have requested : 3D Simulation  I have requested a DVH of the following structures: targets and all normal structures near the target including Bowel, bladder and rectum as noted on the radiation plan to maintain doses in adherence with established limits  PLAN:  The patient will receive 40 Gy in 5 fractions.  ________________________________  Sheral Apley Tammi Klippel, M.D.  This document serves as a record of services personally performed by Tyler Pita, MD. It was created on  his behalf by Wilburn Mylar, a trained medical scribe. The creation of this record is based on the scribe's personal observations and the provider's statements to them. This document has been checked and approved by the attending provider.

## 2018-11-20 NOTE — Progress Notes (Signed)
                               Care Plan Summary  Name: Wesley White DOB: July 07, 1950   Your Medical Team:   Urologist -  Dr. Raynelle Bring, [facility]  Radiation Oncologist - Dr. Tyler Pita, [facility]   Medical Oncologist - Dr. Zola Button, [place]  Recommendations: 1) Androgen deprivation (hormone injection)  2) Abiraterone  3) Radiation to iliac bone lesion  * These recommendations are based on information available as of today's consult.      Recommendations may change depending on the results of further tests or exams.   Next Steps: 1) Dr. Lynne Logan office will schedule for hormone injection  2) [name] will schedule follow up to discuss abiraterone 3) Dr. Johny Shears office will schedule appointment for radiation.  When appointments need to be scheduled, you will be contacted by [facility] and/or [facility].  Questions?  Please do not hesitate to call Cira Rue, RN, BSN, OCN at [phone] any questions or concerns.  Shirlean Mylar is your Oncology Nurse Navigator and is available to assist you while you're receiving your medical care at [facility].

## 2018-11-21 ENCOUNTER — Ambulatory Visit
Admission: RE | Admit: 2018-11-21 | Discharge: 2018-11-21 | Disposition: A | Discharge status: Home / Self Care | Payer: PPO | Source: Ambulatory Visit | Attending: Radiation Oncology | Admitting: Radiation Oncology

## 2018-11-21 ENCOUNTER — Other Ambulatory Visit: Payer: Self-pay

## 2018-11-21 DIAGNOSIS — C7951 Secondary malignant neoplasm of bone: Secondary | ICD-10-CM | POA: Insufficient documentation

## 2018-11-21 DIAGNOSIS — C61 Malignant neoplasm of prostate: Secondary | ICD-10-CM | POA: Insufficient documentation

## 2018-11-21 DIAGNOSIS — Z510 Encounter for antineoplastic radiation therapy: Secondary | ICD-10-CM | POA: Diagnosis not present

## 2018-11-24 ENCOUNTER — Encounter: Payer: Self-pay | Admitting: Urology

## 2018-11-24 ENCOUNTER — Encounter: Payer: Self-pay | Admitting: Oncology

## 2018-11-24 NOTE — Progress Notes (Signed)
Pt will not be on infused chemo so copay assistance isn't needed.  I emailed Meredith in the radiation dept requesting she reach out to pt regarding the [place].

## 2018-11-24 NOTE — Progress Notes (Signed)
Patient started on Firmagon 11/20/18 per inbox communication with Dr. Alinda Money. Wesley White

## 2018-11-25 ENCOUNTER — Encounter: Payer: Self-pay | Admitting: Medical Oncology

## 2018-11-25 DIAGNOSIS — C7951 Secondary malignant neoplasm of bone: Secondary | ICD-10-CM | POA: Diagnosis not present

## 2018-11-25 DIAGNOSIS — Z510 Encounter for antineoplastic radiation therapy: Secondary | ICD-10-CM | POA: Diagnosis not present

## 2018-11-25 DIAGNOSIS — C61 Malignant neoplasm of prostate: Secondary | ICD-10-CM | POA: Diagnosis not present

## 2018-11-25 NOTE — Progress Notes (Signed)
Spoke with Mr. Frei as follow up to [facility] 6/23. He received his Firmagon injection 6/25 and had CT simulation for radiation 6/26. He will begin his radiation 7/14. I confirmed appointment with [name] 7/28. I asked him to call me with questions or concerns and he voiced understanding.

## 2018-12-09 ENCOUNTER — Other Ambulatory Visit: Payer: Self-pay

## 2018-12-09 ENCOUNTER — Ambulatory Visit
Admission: RE | Admit: 2018-12-09 | Discharge: 2018-12-09 | Disposition: A | Discharge status: Home / Self Care | Payer: PPO | Source: Ambulatory Visit | Attending: Radiation Oncology | Admitting: Radiation Oncology

## 2018-12-09 DIAGNOSIS — C61 Malignant neoplasm of prostate: Secondary | ICD-10-CM | POA: Diagnosis not present

## 2018-12-09 DIAGNOSIS — C7951 Secondary malignant neoplasm of bone: Secondary | ICD-10-CM | POA: Diagnosis not present

## 2018-12-09 DIAGNOSIS — Z510 Encounter for antineoplastic radiation therapy: Secondary | ICD-10-CM | POA: Diagnosis not present

## 2018-12-11 ENCOUNTER — Ambulatory Visit
Admission: RE | Admit: 2018-12-11 | Discharge: 2018-12-11 | Disposition: A | Discharge status: Home / Self Care | Payer: PPO | Source: Ambulatory Visit | Attending: Radiation Oncology | Admitting: Radiation Oncology

## 2018-12-11 ENCOUNTER — Other Ambulatory Visit: Payer: Self-pay

## 2018-12-11 DIAGNOSIS — Z510 Encounter for antineoplastic radiation therapy: Secondary | ICD-10-CM | POA: Diagnosis not present

## 2018-12-15 ENCOUNTER — Ambulatory Visit
Admission: RE | Admit: 2018-12-15 | Discharge: 2018-12-15 | Disposition: A | Discharge status: Home / Self Care | Payer: PPO | Source: Ambulatory Visit | Attending: Radiation Oncology | Admitting: Radiation Oncology

## 2018-12-15 ENCOUNTER — Other Ambulatory Visit: Payer: Self-pay

## 2018-12-15 DIAGNOSIS — Z510 Encounter for antineoplastic radiation therapy: Secondary | ICD-10-CM | POA: Diagnosis not present

## 2018-12-16 ENCOUNTER — Ambulatory Visit: Payer: PPO

## 2018-12-17 ENCOUNTER — Ambulatory Visit
Admission: RE | Admit: 2018-12-17 | Discharge: 2018-12-17 | Disposition: A | Discharge status: Home / Self Care | Payer: PPO | Source: Ambulatory Visit | Attending: Radiation Oncology | Admitting: Radiation Oncology

## 2018-12-17 ENCOUNTER — Other Ambulatory Visit: Payer: Self-pay

## 2018-12-17 DIAGNOSIS — Z510 Encounter for antineoplastic radiation therapy: Secondary | ICD-10-CM | POA: Diagnosis not present

## 2018-12-18 ENCOUNTER — Ambulatory Visit: Payer: PPO

## 2018-12-19 ENCOUNTER — Ambulatory Visit
Admission: RE | Admit: 2018-12-19 | Discharge: 2018-12-19 | Disposition: A | Discharge status: Home / Self Care | Payer: PPO | Source: Ambulatory Visit | Attending: Radiation Oncology | Admitting: Radiation Oncology

## 2018-12-19 ENCOUNTER — Encounter: Payer: Self-pay | Admitting: Radiation Oncology

## 2018-12-19 ENCOUNTER — Other Ambulatory Visit: Payer: Self-pay

## 2018-12-19 DIAGNOSIS — C7951 Secondary malignant neoplasm of bone: Secondary | ICD-10-CM | POA: Diagnosis not present

## 2018-12-19 DIAGNOSIS — C61 Malignant neoplasm of prostate: Secondary | ICD-10-CM | POA: Diagnosis not present

## 2018-12-19 DIAGNOSIS — Z510 Encounter for antineoplastic radiation therapy: Secondary | ICD-10-CM | POA: Diagnosis not present

## 2018-12-23 ENCOUNTER — Inpatient Hospital Stay: Payer: PPO | Attending: Oncology | Admitting: Oncology

## 2018-12-23 ENCOUNTER — Other Ambulatory Visit: Payer: Self-pay

## 2018-12-23 VITALS — BP 124/89 | HR 72 | Temp 100.0°F | Resp 17 | Ht 70.0 in | Wt 194.7 lb

## 2018-12-23 DIAGNOSIS — Z8546 Personal history of malignant neoplasm of prostate: Secondary | ICD-10-CM | POA: Diagnosis not present

## 2018-12-23 DIAGNOSIS — C7951 Secondary malignant neoplasm of bone: Secondary | ICD-10-CM | POA: Diagnosis not present

## 2018-12-23 DIAGNOSIS — Z79899 Other long term (current) drug therapy: Secondary | ICD-10-CM | POA: Insufficient documentation

## 2018-12-23 DIAGNOSIS — Z7984 Long term (current) use of oral hypoglycemic drugs: Secondary | ICD-10-CM

## 2018-12-23 DIAGNOSIS — Z923 Personal history of irradiation: Secondary | ICD-10-CM | POA: Insufficient documentation

## 2018-12-23 DIAGNOSIS — C61 Malignant neoplasm of prostate: Secondary | ICD-10-CM | POA: Diagnosis not present

## 2018-12-23 DIAGNOSIS — Z791 Long term (current) use of non-steroidal anti-inflammatories (NSAID): Secondary | ICD-10-CM

## 2018-12-23 DIAGNOSIS — M25559 Pain in unspecified hip: Secondary | ICD-10-CM

## 2018-12-23 DIAGNOSIS — E119 Type 2 diabetes mellitus without complications: Secondary | ICD-10-CM | POA: Diagnosis not present

## 2018-12-23 DIAGNOSIS — E785 Hyperlipidemia, unspecified: Secondary | ICD-10-CM

## 2018-12-23 DIAGNOSIS — G893 Neoplasm related pain (acute) (chronic): Secondary | ICD-10-CM | POA: Diagnosis not present

## 2018-12-23 MED ORDER — ABIRATERONE ACETATE 250 MG PO TABS: 1000.0000 mg | ORAL_TABLET | Freq: Every day | ORAL | 0 refills | Status: DC

## 2018-12-23 MED ORDER — PREDNISONE 5 MG PO TABS: 5.0000 mg | ORAL_TABLET | Freq: Every day | ORAL | 0 refills | Status: DC

## 2018-12-23 NOTE — Progress Notes (Signed)
Hematology and Oncology Follow Up   Wesley White [id number] 1950-10-20 68 y.o. 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  He was initially diagnosed in July 2017 with PSA was 10.4 and a Gleason score 4+3 = 7.     Prior Therapy:   He is status post laparoscopic radical prostatectomy and pelvic lymphadenectomy on March 05, 2016.  The final pathology showed Gleason score 4+3 equal 7 with 0 out of 9 lymph node involved.  He did have extraprostatic extension at that time.   He is status post radiation therapy adjuvantly under the care of Dr. Tammi Klippel completed on May 4 of 2018.  He received 68.4 Gray in 38 fractions.  Status post radiation therapy for isolated left iliac bone metastasis completed in June 2020.   Current therapy: Mills Koller started on 11/20/2018  Interim History: Wesley White is here for a repeat evaluation.  Since the last visit, he completed radiation therapy to the iliac bone with improvement in his pain.  He still requiring hydrocodone has decreased in frequency now close to once every 3 days.  Is ambulating without any major difficulties or falls.  He denies any recent hospitalization or illnesses.  He denies excessive fatigue or tiredness.   Patient denied any alteration mental status, neuropathy, confusion or dizziness.  Denies any headaches or lethargy.  Denies any night sweats, weight loss or changes in appetite.  Denied orthopnea, dyspnea on exertion or chest discomfort.  Denies shortness of breath, difficulty breathing hemoptysis or cough.  Denies any abdominal distention, nausea, early satiety or dyspepsia.  Denies any hematuria, frequency, dysuria or nocturia.  Denies any skin irritation, dryness or rash.  Denies any ecchymosis or petechiae.  Denies any lymphadenopathy or clotting.  Denies any heat or cold intolerance.  Denies  any anxiety or depression.  Remaining review of system is negative.          Medications: Updated today without any changes. Current Outpatient Medications  Medication Sig Dispense Refill  . abiraterone acetate (ZYTIGA) 250 MG tablet Take 4 tablets (1,000 mg total) by mouth daily. Take on an empty stomach 1 hour before or 2 hours after a meal 120 tablet 0  . abiraterone acetate (ZYTIGA) 250 MG tablet Take 4 tablets (1,000 mg total) by mouth daily. Take on an empty stomach 1 hour before or 2 hours after a meal 120 tablet 0  . acetaminophen (TYLENOL) 325 MG tablet Take 650 mg by mouth every 6 (six) hours as needed for mild pain.    . canagliflozin (INVOKANA) 300 MG TABS tablet Take 1 tablet (300 mg total) by mouth daily before breakfast. 90 tablet 1  . CIALIS 5 MG tablet Take 5 mg by mouth daily.  11  . glucose blood test strip 1 each by Other route as needed. Use as instructed 100 each 11  . HYDROcodone-acetaminophen (NORCO) 5-325 MG tablet Take 1 tablet by mouth every 6 (six) hours as needed for moderate pain. (Patient not taking: Reported on 10/24/2018) 15 tablet 0  . ibuprofen (ADVIL) 600 MG tablet Take 600 mg by mouth every 6 (six) hours as needed.    [name] lisinopril (ZESTRIL) 5 MG tablet Take 1 tablet (5 mg total) by mouth daily. 90 tablet 3  . metFORMIN (GLUCOPHAGE) 500 MG tablet Take 1 tablet (500 mg total) by mouth 2 (two) times daily with a meal.  180 tablet 1  . naproxen sodium (ALEVE) 220 MG tablet Take 220 mg by mouth.    . predniSONE (DELTASONE) 5 MG tablet Take 1 tablet (5 mg total) by mouth daily with breakfast. 90 tablet 0  . rosuvastatin (CRESTOR) 20 MG tablet Take 1 tablet (20 mg total) by mouth daily. 90 tablet 3   No current facility-administered medications for this visit.    Facility-Administered Medications Ordered in Other Visits  Medication Dose Route Frequency Provider Last Rate Last Dose  . magnesium citrate solution 1 Bottle  1 Bottle Oral Once Raynelle Bring, MD       . sodium phosphate (FLEET) 7-19 GM/118ML enema 1 enema  1 enema Rectal Once Raynelle Bring, MD         Allergies: No Known Allergies  Past Medical History, Surgical history, Social history, and Family History remains without change in review.    Physical Exam: Blood pressure 124/89, pulse 72, temperature 100 F (37.8 C), temperature source Oral, resp. rate 17, height 5\' 10"  (1.778 m), weight 194 lb 11.2 oz (88.3 kg), SpO2 98 %. ECOG: 1   General appearance: Comfortable appearing without any discomfort Head: Normocephalic without any trauma Oropharynx: Mucous membranes are moist and pink without any thrush or ulcers. Eyes: Pupils are equal and round reactive to light. Lymph nodes: No cervical, supraclavicular, inguinal or axillary lymphadenopathy.   Heart:regular rate and rhythm.  S1 and S2 without leg edema. Lung: Clear without any rhonchi or wheezes.  No dullness to percussion. Abdomin: Soft, nontender, nondistended with good bowel sounds.  No hepatosplenomegaly. Musculoskeletal: No joint deformity or effusion.  Full range of motion noted. Neurological: No deficits noted on motor, sensory and deep tendon reflex exam. Skin: No petechial rash or dryness.  Appeared moist.      Lab Results: Lab Results  Component Value Date   WBC 6.0 10/17/2018   HGB 16.6 10/17/2018   HCT 47.8 10/17/2018   MCV 92 10/17/2018   PLT 244 10/17/2018     Chemistry      Component Value Date/Time   NA 136 10/17/2018 1139   K 5.4 (H) 10/17/2018 1139   CL 98 10/17/2018 1139   CO2 23 10/17/2018 1139   BUN 11 10/17/2018 1139   CREATININE 0.78 10/17/2018 1139   CREATININE 0.81 06/08/2015 0001      Component Value Date/Time   CALCIUM 9.9 10/17/2018 1139   ALKPHOS 338 (H) 10/17/2018 1139   AST 17 10/17/2018 1139   ALT 19 10/17/2018 1139   BILITOT 0.7 10/17/2018 1139        IMPRESSION: New radiotracer accumulation in the medial aspect of the LEFT iliac bone most consistent with  solitary skeletal metastasis. This corresponds to sclerotic lesion on comparison CT.   Impression and Plan:   68 year old man with:  1.  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    He has started on androgen deprivation with Mills Koller and completed radiation to the hip with improvement in his clinical status.  The natural course of this disease was updated and treatment options were reviewed.  The role of Zytiga for treatment escalation was discussed at this time.  Complication associated with this therapy include nausea, fatigue, edema and adrenal insufficiency were reviewed.  Alternative therapy such as systemic chemotherapy, Xtandi among other options.  After discussion today he is agreeable to proceed in the near future.  We will continue to monitor his liver function  tests, electrolytes and PSA moving forward.    2.  Hip pain: Reasonably controlled with hydrocodone.  He completed radiation therapy to the hip.  3.  Androgen deprivation: Risks and benefits of Lupron long-term was discussed.  Complications including weight gain, osteoporosis and others were reviewed.  Is agreeable to proceed and will receive Lupron 30 mg every 4 months starting in August 2020.  4.  Prognosis and goals of care: Therapy remains palliative but aggressive therapy is warranted.  Depending on his response to treatment he could experience a prolonged disease-free interval.  5.  Follow-up: In 6 weeks to follow his progress.  25  minutes was spent with the patient face-to-face today.  More than 50% of time was spent on reviewing his disease status, treatment options and managing complications related therapy.   Zola Button, MD 12/23/2018 3:45 PM

## 2018-12-24 ENCOUNTER — Telehealth: Payer: Self-pay

## 2018-12-24 ENCOUNTER — Telehealth: Payer: Self-pay | Admitting: Pharmacist

## 2018-12-24 ENCOUNTER — Telehealth: Payer: Self-pay | Admitting: Oncology

## 2018-12-24 NOTE — Telephone Encounter (Signed)
Oral Oncology Patient Advocate Encounter  Received notification from [facility] that prior authorization for Wesley White is required.  PA submitted on CoverMyMeds Key [id number] Status is pending  Oral Oncology Clinic will continue to follow.  [place] Patient [place] Phone [phone] Fax [phone] 12/24/2018    9:55 AM

## 2018-12-24 NOTE — Telephone Encounter (Signed)
Scheduled appt per 7/28 sch message - spoke with patient and he is aware of appt date and time

## 2018-12-24 NOTE — Telephone Encounter (Addendum)
Oral Oncology Pharmacist Encounter  Received new prescription for Zytiga (abiraterone) for the treatment of castatration-sensitive, prostate cancer with isolated metastatic disease to the bone in conjunction with prednisone 5 mg daily and Firmagon, planned duration until disease progression or unacceptable toxicity.  Patient initially diagnosed with prostate cancer in July 2017 and is s/p radical prostatectomy and pelvic lymphadenectomy in October 2017, and s/p adjuvant radiation therapy in May 2018. CT of abd/pelvis from June 2020 showed large sclerotic lesion on left iliac bone. Patient was started on Firmagon (degarelix) in June 2020 with plans to switch to Lupron in August 2020.  Patient will take Zytiga 250 mg tablets, 4 tablets (1000 mg) by mouth daily on an empty stomach, at least 1 hour before and 2 hours after food.  Patient will take prednisone 5 mg by mouth daily with breakfast.   Labs from Epic on 10/17/18 assessed, OK for treatment initiation.  Noted elevated potassium of 5.4 and elevated glucose of 313. Patient is on both metformin and Invokana for DM management.  Current medication list in Epic reviewed, no DDIs with Zytiga identified.  Prescription for Fabio Asa has been e-scribed to the [facility] for benefits analysis and approval. Pres[facility] prednisone [facility] [address].   Oral Oncology Clinic will continue to follow for insurance authorization, copayment issues, initial counseling and start date.  Leron Croak, PharmD 12/24/2018 7:55 AM Oral Oncology Clinic [phone]

## 2018-12-24 NOTE — Telephone Encounter (Signed)
Oral Oncology Patient Advocate Encounter  Prior Authorization for Wesley White has been approved.    PA# [id number] Effective dates: 12/24/18 through 12/24/19  Oral Oncology Clinic will continue to follow.   [name] Patient [place] Phone [phone] Fax [phone] 12/24/2018    11:14 AM

## 2018-12-25 ENCOUNTER — Telehealth: Payer: Self-pay

## 2018-12-25 MED FILL — ABIRATERONE ACETATE 250 MG: 250 | 30 days supply | Qty: 120 | Fill #0

## 2018-12-25 NOTE — Telephone Encounter (Signed)
Oral Oncology Patient Advocate Encounter   Was successful in securing patient an $6 grant from Patient [facility]) to provide copayment coverage for Zytiga.  This will keep the out of pocket expense at $0.     I have spoken with the patient.    The billing information is as follows and has been shared with [place].   Member ID: [id number] Group ID: [id number] RxBin: [id number] Dates of Eligibility: 09/25/18 through 12/23/19  [place] Patient [place] Phone [phone] Fax [phone] 12/25/2018    10:44 AM

## 2018-12-25 NOTE — Telephone Encounter (Addendum)
Oral Chemotherapy Pharmacist Encounter  I spoke with patient for overview of: Zytiga for the treatment of metastatic, castration-sensitive prostate cancer in conjunction with prednisone, planned duration until disease progression or unacceptable toxicity.   Counseled patient on administration, dosing, side effects, monitoring, drug-food interactions, safe handling, storage, and disposal.  Patient will take Zytiga 250mg  tablets, 4 tablets (1000mg ) by mouth once daily on an empty stomach, 1 hour before or 2 hours after a meal.  Patient states he will take his Zytiga in the morning at 6AM and will wait at least 1 hour before eating.  Patient will take prednisone 5mg  tablet, 1 tablet by mouth one daily with breakfast.  Zytiga start date: 12/26/18  Adverse effects include but are not limited to: peripheral edema, GI upset, hypertension, hot flashes, fatigue, and arthralgias.    [facility] [address] Patient will obtain prednisone and knows to start prednisone on the same day as Zytiga start.  Reviewed with patient importance of keeping a medication schedule and plan for any missed doses.  Medication reconciliation performed and medication/allergy list updated.  Insurance authorization for Fabio Asa has been obtained. Test claim at the pharmacy revealed copayment $0 for 1st fill of Zytiga. This will ship from the [place] on 7/30 to deliver to patient's home on 7/31.  Patient informed the pharmacy will reach out 5-7 days prior to needing next fill of Zytiga to coordinate continued medication acquisition to prevent break in therapy.  All questions answered.  Mr. Woollard voiced understanding and appreciation.   Patient knows to call the office with questions or concerns.   Leron Croak, PharmD PGY2 Oncology/Hematology Pharmacy Resident 12/25/2018 10:54 AM Oral Oncology Clinic [phone]

## 2018-12-26 NOTE — Telephone Encounter (Signed)
Oral Oncology Patient Advocate Encounter  [place] did not ship from [place] on 7/30. Fabio Asa will ship today, 7/31 and be delivered on Saturday 8/1.  [place] Patient [place] Phone [phone] Fax [phone] 12/26/2018   1:09 PM

## 2018-12-29 NOTE — Telephone Encounter (Signed)
Oral Oncology Patient Advocate Encounter  Confirmed with [place] that Fabio Asa was shipped on 7/31 with a $0 copay using the [place].    [place] Patient [place] Phone [phone] Fax [phone] 12/29/2018   9:26 AM

## 2018-12-30 ENCOUNTER — Other Ambulatory Visit: Payer: Self-pay

## 2018-12-30 ENCOUNTER — Inpatient Hospital Stay: Payer: PPO | Attending: Oncology

## 2018-12-30 VITALS — BP 128/72 | HR 72 | Temp 98.7°F | Resp 17

## 2018-12-30 DIAGNOSIS — M25559 Pain in unspecified hip: Secondary | ICD-10-CM | POA: Diagnosis not present

## 2018-12-30 DIAGNOSIS — C7951 Secondary malignant neoplasm of bone: Secondary | ICD-10-CM | POA: Insufficient documentation

## 2018-12-30 DIAGNOSIS — C61 Malignant neoplasm of prostate: Secondary | ICD-10-CM | POA: Diagnosis not present

## 2018-12-30 DIAGNOSIS — Z79899 Other long term (current) drug therapy: Secondary | ICD-10-CM | POA: Insufficient documentation

## 2018-12-30 MED ORDER — LEUPROLIDE ACETATE (4 MONTH) 30 MG IM KIT
30.0000 mg | PACK | Freq: Once | INTRAMUSCULAR | Status: AC
  Administered 2018-12-30: 12:00:00 30 mg via INTRAMUSCULAR
  Filled 2018-12-30: qty 30

## 2018-12-30 NOTE — Patient Instructions (Signed)

## 2019-01-15 ENCOUNTER — Other Ambulatory Visit: Payer: Self-pay | Admitting: Oncology

## 2019-01-18 NOTE — Progress Notes (Signed)
  Radiation Oncology         [phone]) [phone] ________________________________  Name: Wesley White MRN: [id number]  Date: 12/19/2018  DOB: 09-17-1950  End of Treatment Note  Diagnosis:   68 y.o. gentleman with oligo metastatic prostate cancer with painful bony lesion in the left iliac bone     Indication for treatment:  Curative       Radiation treatment dates:   7/14-7/24/20  Site/dose:   The isolated oligometastasis was treated to 40 Gy in 5 fractions of 8 Gy  Beams/energy:   A VMAT plan with 6FFF fields were used.  Narrative: The patient tolerated radiation treatment relatively well.   No side effects noted.  Plan: The patient has completed radiation treatment. The patient will return to radiation oncology clinic for routine followup in one month. I advised him to call or return sooner if he has any questions or concerns related to his recovery or treatment. ________________________________  Sheral Apley. Tammi Klippel, M.D.

## 2019-01-19 ENCOUNTER — Other Ambulatory Visit: Payer: Self-pay | Admitting: Oncology

## 2019-01-19 NOTE — Telephone Encounter (Signed)
Oral Oncology Patient Advocate Encounter  The patient called stating that the [place] had requested his income information in order for him to be able to keep this grant active. He mailed his tax information into the [employer] foundation but he was concerned about not having his grant.  I called the PAN foundation to ask about this and I spoke to [place]. Loa Socks stated that they have not received his income information yet and his grant has been close. However, the grant could be reinstated if the income information was received before 9/7.  I called the patient back and gave him this information. The patient will email me him income documentation and I will email it to [employer] foundation.  Oral Oncology clinic will continue to follow.  [place] Patient [place] Phone [phone] Fax [phone] 01/19/2019   12:49 PM

## 2019-01-21 ENCOUNTER — Telehealth: Payer: Self-pay

## 2019-01-21 NOTE — Telephone Encounter (Signed)
Oral Oncology Patient Advocate Encounter  PAN foundation closed the patients grant due to his income being to high.  I can help the patient apply for [name] and [facility] patient assistance.  I called the patient and went over this information with him, he agreed to continue with the application.  I filled the application out and emailed it to the patient for him to sign and email back to me.  Oral oncology clinic will continue to follow.  [place] Patient [place] Phone [phone] Fax [phone] 01/21/2019   10:18 AM

## 2019-01-21 NOTE — Telephone Encounter (Signed)
Oral Oncology Patient Advocate Encounter  I faxed the completed application today.  This encounter will be updated until final determination.  [place] Patient [place] Phone [phone] Fax [phone] 01/21/2019   10:42 AM

## 2019-01-21 NOTE — Telephone Encounter (Signed)
Oral Oncology Patient Advocate Encounter  I called the PAN foundation to follow up on the grant reinstatement.  I spoke to Loa Socks who informed me that the patients income was to high to be approved for the grant.  I called the patient and gave him this information. This is still his current income so we would not be able to send in additional information to try and get him approved.  I can help him apply for [name] and [place] patient assistance. This information will be in a separate encounter.  [place] Patient [place] Phone [phone] Fax [phone] 01/21/2019   10:15 AM

## 2019-01-22 ENCOUNTER — Other Ambulatory Visit: Payer: Self-pay

## 2019-01-22 ENCOUNTER — Encounter: Payer: Self-pay | Admitting: Urology

## 2019-01-22 ENCOUNTER — Ambulatory Visit
Admission: RE | Admit: 2019-01-22 | Discharge: 2019-01-22 | Disposition: A | Discharge status: Home / Self Care | Payer: PPO | Source: Ambulatory Visit | Attending: Urology | Admitting: Urology

## 2019-01-22 DIAGNOSIS — C7951 Secondary malignant neoplasm of bone: Secondary | ICD-10-CM

## 2019-01-22 DIAGNOSIS — C61 Malignant neoplasm of prostate: Secondary | ICD-10-CM

## 2019-01-22 NOTE — Telephone Encounter (Signed)
Oral Oncology Patient Advocate Encounter  I followed up with Wesley White and Wesley White on the [place] application.  The application is in process and they have everything they need from [place].  This encounter will be updated until final determination.  [place] Patient [place] Phone [phone] Fax [phone] 01/22/2019   2:21 PM

## 2019-01-22 NOTE — Progress Notes (Signed)
Radiation Oncology         [phone]) [phone] ________________________________  Name: Wesley White MRN: [id number]  Date: 01/22/2019  DOB: 12/22/1950  Post Treatment Note  CC: [name], MD  Raynelle Bring, MD  Diagnosis:   68 y.o. gentleman with oligo metastatic prostate cancer with painful bony lesion in the left iliac bone    Interval Since Last Radiation:  5 weeks  12/09/18-12/19/18: The isolated oligometastasis was treated to 40 Gy in 5 fractions of 8 Gy  Narrative:  I spoke with the patient to conduct his routine scheduled 1 month follow up visit via telephone to spare the patient unnecessary potential exposure in the healthcare setting during the current COVID-19 pandemic.  The patient was notified in advance and gave permission to proceed with this visit format. He tolerated radiation treatment relatively well without any acute side effects noted.                              On review of systems, the patient states that he is doing very well overall.  He has noticed significant improvement in his left hip and lower back pain and is no longer requiring narcotic pain medications regularly which he is most pleased with.  He specifically denies any bowel or bladder issues, abdominal pain, nausea, vomiting, diarrhea or constipation.  He has not had recent fevers, chills, night sweats or unintentional weight loss.  He continues to tolerate ADT relatively well and will continue on Lupron every 4 months as well as adding daily Zytiga for treatment of his castrate sensitive metastatic prostate cancer under the care and direction of [name].  His next scheduled follow-up visit with [name] is on February 03, 2019.  ALLERGIES:  has No Known Allergies.  Meds: Current Outpatient Medications  Medication Sig Dispense Refill  . abiraterone acetate (ZYTIGA) 250 MG tablet TAKE 4 TABLETS (1,000 MG TOTAL) BY MOUTH DAILY. TAKE ON AN EMPTY STOMACH 1 HOUR BEFORE OR 2 HOURS AFTER A MEAL 120 tablet 0   . glucose blood test strip 1 each by Other route as needed. Use as instructed 100 each 11  . lisinopril (ZESTRIL) 5 MG tablet Take 1 tablet (5 mg total) by mouth daily. 90 tablet 3  . metFORMIN (GLUCOPHAGE) 500 MG tablet Take 1 tablet (500 mg total) by mouth 2 (two) times daily with a meal. 180 tablet 1  . predniSONE (DELTASONE) 5 MG tablet TAKE 1 TABLET BY MOUTH EVERY DAY WITH BREAKFAST 90 tablet 0  . rosuvastatin (CRESTOR) 20 MG tablet Take 1 tablet (20 mg total) by mouth daily. 90 tablet 3  . acetaminophen (TYLENOL) 325 MG tablet Take 650 mg by mouth every 6 (six) hours as needed for mild pain.    . canagliflozin (INVOKANA) 300 MG TABS tablet Take 1 tablet (300 mg total) by mouth daily before breakfast. (Patient not taking: Reported on 01/22/2019) 90 tablet 1  . CIALIS 5 MG tablet Take 5 mg by mouth daily.  11  . HYDROcodone-acetaminophen (NORCO) 5-325 MG tablet Take 1 tablet by mouth every 6 (six) hours as needed for moderate pain. (Patient not taking: Reported on 10/24/2018) 15 tablet 0  . ibuprofen (ADVIL) 600 MG tablet Take 600 mg by mouth every 6 (six) hours as needed.    . naproxen sodium (ALEVE) 220 MG tablet Take 220 mg by mouth.    [name] Delica Lancets [id number] MISC See admin instructions.    [name]  oxyCODONE-acetaminophen (PERCOCET/ROXICET) 5-325 MG tablet Take 1-2 tablets by mouth every 6 (six) hours as needed.     No current facility-administered medications for this encounter.    Facility-Administered Medications Ordered in Other Encounters  Medication Dose Route Frequency Provider Last Rate Last Dose  . magnesium citrate solution 1 Bottle  1 Bottle Oral Once Raynelle Bring, MD      . sodium phosphate (FLEET) 7-19 GM/118ML enema 1 enema  1 enema Rectal Once Raynelle Bring, MD        Physical Findings:  vitals were not taken for this visit.   Karen Kays to assess due to telephone follow-up visit format.  Lab Findings: Lab Results  Component Value Date   WBC 6.0 10/17/2018   HGB  16.6 10/17/2018   HCT 47.8 10/17/2018   MCV 92 10/17/2018   PLT 244 10/17/2018     Radiographic Findings: No results found.  Impression/Plan: 1. 68 y.o. gentleman with oligo metastatic prostate cancer with painful bony lesion in the left iliac bone. The patient appears to have recovered appropriately from the effects of his recent radiotherapy and has noted significant pain relief in the left hip and low back.  He is quite pleased with his progress to date.  We discussed that while we are happy to continue to participate in his care if clinically indicated, at this point, we will plan to see him back on an as-needed basis.  He will continue in routine follow-up for systemic disease management under the care and direction of [name].  He will continue with Lupron ADT every 4 months and Zytiga daily.   He appears to have a good understanding and is comfortable and in agreement with the stated plan. He knows that he is welcome to call at anytime with any questions or concerns related to his previous radiotherapy.     Nicholos Johns, PA-C

## 2019-01-26 NOTE — Telephone Encounter (Signed)
Oral Oncology Patient Advocate Encounter  [place] application has been denied due to the income being to high.   I called the patient and talked to him about this. This amount does include payouts of savings but when subtracting that amount from yearly income he is still well over the income limit.  PANF also denied his grant application.  Unfortunately, I do not have any other assistance options for the patient. His copay is $1085.77, this is not affordable for him understandably.  He did receive one month of Zytiga on 8/27 and took that months worth of medicine.  I let the patient know that I would pass this information along to see what else we could do for him.  The patient verbalized understanding and great appreciation.  [place] Patient [place] Phone [phone] Fax [phone] 01/26/2019   10:01 AM

## 2019-02-03 ENCOUNTER — Inpatient Hospital Stay: Payer: PPO | Attending: Oncology | Admitting: Oncology

## 2019-02-03 ENCOUNTER — Other Ambulatory Visit: Payer: Self-pay

## 2019-02-03 ENCOUNTER — Inpatient Hospital Stay: Payer: PPO

## 2019-02-03 ENCOUNTER — Other Ambulatory Visit: Payer: Self-pay | Admitting: Oncology

## 2019-02-03 VITALS — BP 135/85 | HR 94 | Temp 98.5°F | Resp 18 | Ht 70.0 in | Wt 199.8 lb

## 2019-02-03 DIAGNOSIS — C7951 Secondary malignant neoplasm of bone: Secondary | ICD-10-CM | POA: Diagnosis not present

## 2019-02-03 DIAGNOSIS — Z923 Personal history of irradiation: Secondary | ICD-10-CM | POA: Insufficient documentation

## 2019-02-03 DIAGNOSIS — Z79899 Other long term (current) drug therapy: Secondary | ICD-10-CM | POA: Insufficient documentation

## 2019-02-03 DIAGNOSIS — C61 Malignant neoplasm of prostate: Secondary | ICD-10-CM | POA: Diagnosis not present

## 2019-02-03 DIAGNOSIS — M25559 Pain in unspecified hip: Secondary | ICD-10-CM | POA: Insufficient documentation

## 2019-02-03 LAB — CMP (CANCER CENTER ONLY)
ALT: 12 U/L (ref 0–44)
AST: 12 U/L — ABNORMAL LOW (ref 15–41)
Albumin: 3.9 g/dL (ref 3.5–5.0)
Alkaline Phosphatase: 72 U/L (ref 38–126)
Anion gap: 7 (ref 5–15)
BUN: 20 mg/dL (ref 8–23)
CO2: 26 mmol/L (ref 22–32)
Calcium: 9.5 mg/dL (ref 8.9–10.3)
Chloride: 108 mmol/L (ref 98–111)
Creatinine: 0.78 mg/dL (ref 0.61–1.24)
GFR, Est AFR Am: >60 mL/min (ref >60)
GFR, Estimated: >60 mL/min (ref >60)
Glucose, Bld: 131 mg/dL — ABNORMAL HIGH (ref 70–99)
Potassium: 4.6 mmol/L (ref 3.5–5.1)
Sodium: 141 mmol/L (ref 135–145)
Total Bilirubin: 0.6 mg/dL (ref 0.3–1.2)
Total Protein: 7 g/dL (ref 6.5–8.1)

## 2019-02-03 LAB — CBC WITH DIFFERENTIAL (CANCER CENTER ONLY)
Abs Immature Granulocytes: 0.04 10*3/uL (ref 0.00–0.07)
Basophils Absolute: 0 10*3/uL (ref 0.0–0.1)
Basophils Relative: 1 %
Eosinophils Absolute: 0 10*3/uL (ref 0.0–0.5)
Eosinophils Relative: 0 %
HCT: 41.9 % (ref 39.0–52.0)
Hemoglobin: 13.9 g/dL (ref 13.0–17.0)
Immature Granulocytes: 1 %
Lymphocytes Relative: 14 %
Lymphs Abs: 0.7 10*3/uL (ref 0.7–4.0)
MCH: 31.5 pg (ref 26.0–34.0)
MCHC: 33.2 g/dL (ref 30.0–36.0)
MCV: 95 fL (ref 80.0–100.0)
Monocytes Absolute: 0.3 10*3/uL (ref 0.1–1.0)
Monocytes Relative: 7 %
Neutro Abs: 3.7 10*3/uL (ref 1.7–7.7)
Neutrophils Relative %: 77 %
Platelet Count: 209 10*3/uL (ref 150–400)
RBC: 4.41 MIL/uL (ref 4.22–5.81)
RDW: 13.6 % (ref 11.5–15.5)
WBC Count: 4.8 10*3/uL (ref 4.0–10.5)
nRBC: 0 % (ref 0.0–0.2)

## 2019-02-03 NOTE — Progress Notes (Signed)
Hematology and Oncology Follow Up   Wesley White [id number] 1951-03-05 68 y.o. 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  He was diagnosed with prostate cancer in July 2017 with PSA was 10.4 and a Gleason score 4+3 = 7.     Prior Therapy:   He is status post laparoscopic radical prostatectomy and pelvic lymphadenectomy on March 05, 2016.  The final pathology showed Gleason score 4+3 equal 7 with 0 out of 9 lymph node involved.  He did have extraprostatic extension at that time.   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  He is status post radiation therapy adjuvantly under the care of Dr. Tammi Klippel completed on May 4 of 2018.  He received 68.4 Gray in 38 fractions.  Status post radiation therapy for isolated left iliac bone metastasis completed in June 2020.   Current therapy:   Mills Koller started on 11/20/2018.  He was changed to Lupron 30 mg every 4 months started on 12/30/2018.  Zytiga 1000 mg daily started in August 2020.  Therapy discontinued because of lack of coverage.  Interim History: Wesley White is here for a follow-up.  Since the last visit, he reports no major changes in his health.  He denies any recent hospitalizations or illnesses.  He denies any increased hip pain or discomfort.  He has a left-sided pelvic discomfort has nearly resolved at this time.  He is able to ambulate and attempt activities of daily living without any issues.  He denies any complications related to Lupron at this time.  He denied headaches, blurry vision, syncope or seizures.  Denies any fevers, chills or sweats.  Denied chest pain, palpitation, orthopnea or leg edema.  Denied cough, wheezing or hemoptysis.  Denied nausea, vomiting or abdominal pain.  Denies any constipation or diarrhea.  Denies any frequency urgency or hesitancy.  Denies any  arthralgias or myalgias.  Denies any skin rashes or lesions.  Denies any bleeding or clotting tendency.  Denies any easy bruising.  Denies any hair or nail changes.  Denies any anxiety or depression.  Remaining review of system is negative.            Medications: Without any changes on review. Current Outpatient Medications  Medication Sig Dispense Refill  . abiraterone acetate (ZYTIGA) 250 MG tablet TAKE 4 TABLETS (1,000 MG TOTAL) BY MOUTH DAILY. TAKE ON AN EMPTY STOMACH 1 HOUR BEFORE OR 2 HOURS AFTER A MEAL 120 tablet 0  . acetaminophen (TYLENOL) 325 MG tablet Take 650 mg by mouth every 6 (six) hours as needed for mild pain.    . canagliflozin (INVOKANA) 300 MG TABS tablet Take 1 tablet (300 mg total) by mouth daily before breakfast. (Patient not taking: Reported on 01/22/2019) 90 tablet 1  . CIALIS 5 MG tablet Take 5 mg by mouth daily.  11  . glucose blood test strip 1 each by Other route as needed. Use as instructed 100 each 11  . HYDROcodone-acetaminophen (NORCO) 5-325 MG tablet Take 1 tablet by mouth every 6 (six) hours as needed for moderate pain. (Patient not taking: Reported on 10/24/2018) 15 tablet 0  . ibuprofen (ADVIL) 600 MG tablet Take 600 mg by mouth every 6 (six) hours as needed.    [name] lisinopril (ZESTRIL) 5 MG tablet Take 1 tablet (5 mg total) by mouth daily. 90 tablet 3  . metFORMIN (  GLUCOPHAGE) 500 MG tablet Take 1 tablet (500 mg total) by mouth 2 (two) times daily with a meal. 180 tablet 1  . naproxen sodium (ALEVE) 220 MG tablet Take 220 mg by mouth.    [name] Delica Lancets [id number] MISC See admin instructions.    [name] oxyCODONE-acetaminophen (PERCOCET/ROXICET) 5-325 MG tablet Take 1-2 tablets by mouth every 6 (six) hours as needed.    . predniSONE (DELTASONE) 5 MG tablet TAKE 1 TABLET BY MOUTH EVERY DAY WITH BREAKFAST 90 tablet 0  . rosuvastatin (CRESTOR) 20 MG tablet Take 1 tablet (20 mg total) by mouth daily. 90 tablet 3   No current facility-administered medications  for this visit.    Facility-Administered Medications Ordered in Other Visits  Medication Dose Route Frequency Provider Last Rate Last Dose  . magnesium citrate solution 1 Bottle  1 Bottle Oral Once Raynelle Bring, MD      . sodium phosphate (FLEET) 7-19 GM/118ML enema 1 enema  1 enema Rectal Once Raynelle Bring, MD         Allergies: No Known Allergies  Past Medical History, Surgical history, Social history, and Family History updated on review.    Physical Exam: Blood pressure 135/85, pulse 94, temperature 98.5 F (36.9 C), temperature source Temporal, resp. rate 18, height 5\' 10"  (1.778 m), weight 199 lb 12.8 oz (90.6 kg), SpO2 98 %.  ECOG: 1     General appearance: Alert, awake without any distress. Head: Atraumatic without abnormalities Oropharynx: Without any thrush or ulcers. Eyes: No scleral icterus. Lymph nodes: No lymphadenopathy noted in the cervical, supraclavicular, or axillary nodes Heart:regular rate and rhythm, without any murmurs or gallops.   White: Clear to auscultation without any rhonchi, wheezes or dullness to percussion. Abdomin: Soft, nontender without any shifting dullness or ascites. Musculoskeletal: No clubbing or cyanosis. Neurological: No motor or sensory deficits. Skin: No rashes or lesions.       Lab Results: Lab Results  Component Value Date   WBC 6.0 10/17/2018   HGB 16.6 10/17/2018   HCT 47.8 10/17/2018   MCV 92 10/17/2018   PLT 244 10/17/2018     Chemistry      Component Value Date/Time   NA 136 10/17/2018 1139   K 5.4 (H) 10/17/2018 1139   CL 98 10/17/2018 1139   CO2 23 10/17/2018 1139   BUN 11 10/17/2018 1139   CREATININE 0.78 10/17/2018 1139   CREATININE 0.81 06/08/2015 0001      Component Value Date/Time   CALCIUM 9.9 10/17/2018 1139   ALKPHOS 338 (H) 10/17/2018 1139   AST 17 10/17/2018 1139   ALT 19 10/17/2018 1139   BILITOT 0.7 10/17/2018 1139          Impression and Plan:   68 year old man  with:  1.  Advanced prostate cancer with bone metastasis diagnosed in June 2020.  He has isolated left iliac lesion.    He has completed a month of Zytiga without any complications.  Risks and benefits of continuing this therapy for longer term versus continuing with androgen deprivation therapy alone was discussed.  Alternative options such as Xtandi as well as systemic chemotherapy also reviewed.  We also discussed the role of these agents as he develops castration-resistant disease in the future.  Deferring these options to a later date as well as potential need for further radiation was also reiterated.  After discussion today, we opted to continue with androgen deprivation therapy alone and defer any additional therapy unless he develops progression of  disease.  Plan to repeat imaging studies in 6 months.   2.  Hip pain: Improved at this time after radiation therapy.  He does not take any medication.  3.  Androgen deprivation: He is currently on Lupron at 30 mg every 4 months.  Next injection will be in December 2020.  Long-term complications including osteoporosis as well as weight gain were reviewed and is agreeable to continue.  4.  Prognosis and goals of care: His disease remains incurable although aggressive measures are warranted given his excellent performance status and limited disease.   5.  Follow-up: 3 months for repeat evaluation.  25  minutes was spent with the patient face-to-face today.  More than 50% of time was dedicated to reviewing the natural course of his disease, treatment options, complications of therapy and answering questions regarding future plan of care.   Zola Button, MD 02/03/2019 11:15 AM

## 2019-02-04 LAB — PROSTATE-SPECIFIC AG, SERUM (LABCORP): Prostate Specific Ag, Serum: <0.1 ng/mL (ref 0.0–4.0)

## 2019-02-05 ENCOUNTER — Other Ambulatory Visit: Payer: Self-pay

## 2019-02-05 ENCOUNTER — Encounter: Payer: Self-pay | Admitting: Family Medicine

## 2019-02-05 ENCOUNTER — Telehealth: Payer: Self-pay

## 2019-02-05 ENCOUNTER — Ambulatory Visit (INDEPENDENT_AMBULATORY_CARE_PROVIDER_SITE_OTHER): Payer: PPO | Admitting: Family Medicine

## 2019-02-05 VITALS — BP 114/70 | HR 72 | Temp 97.7°F | Ht 68.75 in | Wt 198.4 lb

## 2019-02-05 DIAGNOSIS — E1169 Type 2 diabetes mellitus with other specified complication: Secondary | ICD-10-CM | POA: Diagnosis not present

## 2019-02-05 DIAGNOSIS — E669 Obesity, unspecified: Secondary | ICD-10-CM | POA: Diagnosis not present

## 2019-02-05 DIAGNOSIS — C61 Malignant neoplasm of prostate: Secondary | ICD-10-CM | POA: Diagnosis not present

## 2019-02-05 DIAGNOSIS — Z23 Encounter for immunization: Secondary | ICD-10-CM | POA: Diagnosis not present

## 2019-02-05 DIAGNOSIS — E785 Hyperlipidemia, unspecified: Secondary | ICD-10-CM | POA: Diagnosis not present

## 2019-02-05 DIAGNOSIS — E119 Type 2 diabetes mellitus without complications: Secondary | ICD-10-CM

## 2019-02-05 LAB — POCT GLYCOSYLATED HEMOGLOBIN (HGB A1C): Hemoglobin A1C: 6.4 % — AB (ref 4.0–5.6)

## 2019-02-05 NOTE — Telephone Encounter (Signed)
Contacted patient and made aware of results and provided upcoming appointment information. Patient verbalized understanding and appreciaitve of the call.

## 2019-02-05 NOTE — Telephone Encounter (Signed)
-----   Message from [name], MD sent at 02/04/2019  8:38 AM EDT ----- Please let him know his PSA is low.

## 2019-02-05 NOTE — Progress Notes (Signed)
  Subjective:    Patient ID: Wesley White, male    DOB: 1950-09-19, 68 y.o.   MRN: [id number]  Wesley White is a 68 y.o. male who presents for follow-up of Type 2 diabetes mellitus.  Patient is checking home blood sugars.   Home blood sugar records: meter record and pt has log How often is blood sugars being checked: qd two hours post meal 67-179 Current symptoms/problems include none at this time. Daily foot checks: yes  Any foot concerns: no Last eye exam: two year ago Exercise: not much but does hike and walk He is taking Invokana as well as metformin and having no difficulty with that.  He has been through diabetes education and seems to be doing a good job with his diet and exercise he is also taking Crestor without any aches or pains.  Is on low-dose lisinopril.  Continues to be followed by oncology.  His last PSA was 0.1 which is a great improvement from previous 1.  He has had surgery, radiation and shots.  He is soon to start taking prednisone. The following portions of the patient's history were reviewed and updated as appropriate: allergies, current medications, past medical history, past social history and problem list.  ROS as in subjective above.     Objective:    Physical Exam Alert and in no distress otherwise not examined.   Lab Review Diabetic Labs Latest Ref Rng & Units 02/03/2019 10/24/2018 10/17/2018 07/04/2017 02/15/2016  HbA1c 4.8 - 5.6 % - - 11.3(H) - -  Chol 100 - 199 mg/dL - 224(H) - 233(H) -  HDL >39 mg/dL - 55 - 47 -  Calc LDL 0 - 99 mg/dL - 151(H) - 159(H) -  Triglycerides 0 - 149 mg/dL - 91 - 134 -  Creatinine 0.61 - 1.24 mg/dL 0.78 - 0.78 0.84 0.74   BP/Weight 02/05/2019 02/03/2019 12/30/2018 12/23/2018 [id number]  Systolic BP [id number] -  Diastolic BP 70 85 72 89 -  Wt. (Lbs) 198.4 199.8 - 194.7 -  BMI 29.51 28.67 - 27.94 -   Hemoglobin A1c is 6.4 Wesley White  reports that he has never smoked. He has never used smokeless tobacco. He reports current alcohol use.  He reports that he does not use drugs.     Assessment & Plan:    New onset type 2 diabetes mellitus ([place])  Need for influenza vaccination - Plan: Flu Vaccine QUAD High Dose(Fluad)  Need for vaccination against Streptococcus pneumoniae - Plan: Pneumococcal conjugate vaccine 13-valent  Hyperlipidemia associated with type 2 diabetes mellitus ([place])  Prostate cancer ([place])  Obesity (BMI 30.0-34.9)   1. Rx changes: I have asked him to hold his prednisone for several weeks to give the shot adequate time to work. 2. Education: Reviewed 'ABCs' of diabetes management (respective goals in parentheses):  A1C (<7), blood pressure (<130/80), and cholesterol (LDL <100). 3. Compliance at present is estimated to be good. Efforts to improve compliance (if necessary) will be directed at increased exercise. 4. Follow up: 4 months

## 2019-02-06 DIAGNOSIS — C61 Malignant neoplasm of prostate: Secondary | ICD-10-CM | POA: Diagnosis not present

## 2019-02-17 DIAGNOSIS — E119 Type 2 diabetes mellitus without complications: Secondary | ICD-10-CM | POA: Diagnosis not present

## 2019-02-17 DIAGNOSIS — H524 Presbyopia: Secondary | ICD-10-CM | POA: Diagnosis not present

## 2019-02-17 DIAGNOSIS — H40013 Open angle with borderline findings, low risk, bilateral: Secondary | ICD-10-CM | POA: Diagnosis not present

## 2019-02-17 DIAGNOSIS — H40053 Ocular hypertension, bilateral: Secondary | ICD-10-CM | POA: Diagnosis not present

## 2019-02-17 DIAGNOSIS — H2513 Age-related nuclear cataract, bilateral: Secondary | ICD-10-CM | POA: Diagnosis not present

## 2019-02-17 DIAGNOSIS — H5203 Hypermetropia, bilateral: Secondary | ICD-10-CM | POA: Diagnosis not present

## 2019-02-17 DIAGNOSIS — H52223 Regular astigmatism, bilateral: Secondary | ICD-10-CM | POA: Diagnosis not present

## 2019-02-17 LAB — HM DIABETES EYE EXAM

## 2019-02-23 NOTE — Progress Notes (Signed)
[place] Initial Psychosocial Assessment Counseling Intern   Counseling Intern contacted patient by phone to assess psychosocial, emotional, mental health, and spiritual needs of the patient.  Provided therapeutic listening and emotional support over the phone.      Patient stated that emotionally he is "OK" at the present time and that he has accepted his "life as it is". Patient reported getting more information about his prognosis in the past months, which will help him make important financial and personal decisions.  Patient stated that upon his inquiry his doctor indicated that his potential longevity is likely "in the single digits" and that he may be able to live a relatively normal life up until 6 months before his death. Patient stated "it is a gift" to know this information so he can be sure to live his life to the fullest while he can. The patient reports that he has hobbies and passions that keep him occupied as well as friends and family for support.  Also, he reported that he uses humor as a coping strategy.     Identifications of barriers to care: none, but patient expressed concerns about $1200/month price of needed medication   I emailed the patient information on support services at [facility] and will follow-up with another check-in call in a month.    Art Buff [place] Counseling Intern Voicemail:  [phone]

## 2019-04-02 ENCOUNTER — Telehealth: Payer: Self-pay | Admitting: Oncology

## 2019-04-02 NOTE — Telephone Encounter (Signed)
R/s appt per 11/5 sch message - pt aware of new appt date and time

## 2019-05-05 ENCOUNTER — Other Ambulatory Visit: Payer: Self-pay | Admitting: Family Medicine

## 2019-05-05 DIAGNOSIS — E119 Type 2 diabetes mellitus without complications: Secondary | ICD-10-CM

## 2019-05-06 ENCOUNTER — Inpatient Hospital Stay (HOSPITAL_BASED_OUTPATIENT_CLINIC_OR_DEPARTMENT_OTHER): Payer: HMO | Admitting: Oncology

## 2019-05-06 ENCOUNTER — Other Ambulatory Visit: Payer: PPO

## 2019-05-06 ENCOUNTER — Inpatient Hospital Stay: Payer: HMO | Attending: Oncology

## 2019-05-06 ENCOUNTER — Other Ambulatory Visit: Payer: Self-pay

## 2019-05-06 ENCOUNTER — Inpatient Hospital Stay: Payer: HMO

## 2019-05-06 ENCOUNTER — Ambulatory Visit: Payer: PPO | Admitting: Oncology

## 2019-05-06 ENCOUNTER — Ambulatory Visit: Payer: PPO

## 2019-05-06 VITALS — BP 119/76 | HR 75 | Temp 98.0°F | Resp 18 | Ht 68.75 in | Wt 205.3 lb

## 2019-05-06 DIAGNOSIS — M25511 Pain in right shoulder: Secondary | ICD-10-CM | POA: Insufficient documentation

## 2019-05-06 DIAGNOSIS — M25512 Pain in left shoulder: Secondary | ICD-10-CM | POA: Insufficient documentation

## 2019-05-06 DIAGNOSIS — Z79899 Other long term (current) drug therapy: Secondary | ICD-10-CM | POA: Diagnosis not present

## 2019-05-06 DIAGNOSIS — Z923 Personal history of irradiation: Secondary | ICD-10-CM | POA: Diagnosis not present

## 2019-05-06 DIAGNOSIS — C61 Malignant neoplasm of prostate: Secondary | ICD-10-CM | POA: Diagnosis not present

## 2019-05-06 DIAGNOSIS — C7951 Secondary malignant neoplasm of bone: Secondary | ICD-10-CM | POA: Diagnosis not present

## 2019-05-06 DIAGNOSIS — M25552 Pain in left hip: Secondary | ICD-10-CM | POA: Diagnosis not present

## 2019-05-06 LAB — CBC WITH DIFFERENTIAL (CANCER CENTER ONLY)
Abs Immature Granulocytes: 0.02 10*3/uL (ref 0.00–0.07)
Basophils Absolute: 0 10*3/uL (ref 0.0–0.1)
Basophils Relative: 1 %
Eosinophils Absolute: 0 10*3/uL (ref 0.0–0.5)
Eosinophils Relative: 1 %
HCT: 41.2 % (ref 39.0–52.0)
Hemoglobin: 14 g/dL (ref 13.0–17.0)
Immature Granulocytes: 0 %
Lymphocytes Relative: 15 %
Lymphs Abs: 0.8 10*3/uL (ref 0.7–4.0)
MCH: 31.4 pg (ref 26.0–34.0)
MCHC: 34 g/dL (ref 30.0–36.0)
MCV: 92.4 fL (ref 80.0–100.0)
Monocytes Absolute: 0.4 10*3/uL (ref 0.1–1.0)
Monocytes Relative: 8 %
Neutro Abs: 3.9 10*3/uL (ref 1.7–7.7)
Neutrophils Relative %: 75 %
Platelet Count: 201 10*3/uL (ref 150–400)
RBC: 4.46 MIL/uL (ref 4.22–5.81)
RDW: 12.8 % (ref 11.5–15.5)
WBC Count: 5.2 10*3/uL (ref 4.0–10.5)
nRBC: 0 % (ref 0.0–0.2)

## 2019-05-06 LAB — CMP (CANCER CENTER ONLY)
ALT: 22 U/L (ref 0–44)
AST: 18 U/L (ref 15–41)
Albumin: 3.8 g/dL (ref 3.5–5.0)
Alkaline Phosphatase: 76 U/L (ref 38–126)
Anion gap: 7 (ref 5–15)
BUN: 19 mg/dL (ref 8–23)
CO2: 27 mmol/L (ref 22–32)
Calcium: 9.3 mg/dL (ref 8.9–10.3)
Chloride: 106 mmol/L (ref 98–111)
Creatinine: 0.73 mg/dL (ref 0.61–1.24)
GFR, Est AFR Am: >60 mL/min (ref >60)
GFR, Estimated: >60 mL/min (ref >60)
Glucose, Bld: 113 mg/dL — ABNORMAL HIGH (ref 70–99)
Potassium: 4.3 mmol/L (ref 3.5–5.1)
Sodium: 140 mmol/L (ref 135–145)
Total Bilirubin: 0.4 mg/dL (ref 0.3–1.2)
Total Protein: 6.8 g/dL (ref 6.5–8.1)

## 2019-05-06 MED ORDER — LEUPROLIDE ACETATE (4 MONTH) 30 MG ~~LOC~~ KIT
30.0000 mg | PACK | Freq: Once | SUBCUTANEOUS | Status: AC
  Administered 2019-05-06: 30 mg via SUBCUTANEOUS
  Filled 2019-05-06: qty 30

## 2019-05-06 NOTE — Patient Instructions (Signed)
Leuprolide injection What is this medicine? LEUPROLIDE (loo PROE lide) is a man-made hormone. It is used to treat the symptoms of prostate cancer. This medicine may also be used to treat children with early onset of puberty. It may be used for other hormonal conditions. This medicine may be used for other purposes; ask your health care provider or pharmacist if you have questions. COMMON BRAND NAME(S): Lupron What should I tell my health care provider before I take this medicine? They need to know if you have any of these conditions:  diabetes  heart disease or previous heart attack  high blood pressure  high cholesterol  pain or difficulty passing urine  spinal cord metastasis  stroke  tobacco smoker  an unusual or allergic reaction to leuprolide, benzyl alcohol, other medicines, foods, dyes, or preservatives  pregnant or trying to get pregnant  breast-feeding How should I use this medicine? This medicine is for injection under the skin or into a muscle. You will be taught how to prepare and give this medicine. Use exactly as directed. Take your medicine at regular intervals. Do not take your medicine more often than directed. It is important that you put your used needles and syringes in a special sharps container. Do not put them in a trash can. If you do not have a sharps container, call your pharmacist or healthcare provider to get one. A special MedGuide will be given to you by the pharmacist with each prescription and refill. Be sure to read this information carefully each time. Talk to your pediatrician regarding the use of this medicine in children. While this medicine may be prescribed for children as young as 8 years for selected conditions, precautions do apply. Overdosage: If you think you have taken too much of this medicine contact a poison control center or emergency room at once. NOTE: This medicine is only for you. Do not share this medicine with others. What if  I miss a dose? If you miss a dose, take it as soon as you can. If it is almost time for your next dose, take only that dose. Do not take double or extra doses. What may interact with this medicine? Do not take this medicine with any of the following medications:  chasteberry This medicine may also interact with the following medications:  herbal or dietary supplements, like black cohosh or DHEA  male hormones, like estrogens or progestins and birth control pills, patches, rings, or injections  male hormones, like testosterone This list may not describe all possible interactions. Give your health care provider a list of all the medicines, herbs, non-prescription drugs, or dietary supplements you use. Also tell them if you smoke, drink alcohol, or use illegal drugs. Some items may interact with your medicine. What should I watch for while using this medicine? Visit your doctor or health care professional for regular checks on your progress. During the first week, your symptoms may get worse, but then will improve as you continue your treatment. You may get hot flashes, increased bone pain, increased difficulty passing urine, or an aggravation of nerve symptoms. Discuss these effects with your doctor or health care professional, some of them may improve with continued use of this medicine. Male patients may experience a menstrual cycle or spotting during the first 2 months of therapy with this medicine. If this continues, contact your doctor or health care professional. This medicine may increase blood sugar. Ask your healthcare provider if changes in diet or medicines are needed if   you have diabetes. What side effects may I notice from receiving this medicine? Side effects that you should report to your doctor or health care professional as soon as possible:  allergic reactions like skin rash, itching or hives, swelling of the face, lips, or tongue  breathing problems  chest  pain  depression or memory disorders  pain in your legs or groin  pain at site where injected  severe headache  signs and symptoms of high blood sugar such as being more thirsty or hungry or having to urinate more than normal. You may also feel very tired or have blurry vision  swelling of the feet and legs  visual changes  vomiting Side effects that usually do not require medical attention (report to your doctor or health care professional if they continue or are bothersome):  breast swelling or tenderness  decrease in sex drive or performance  diarrhea  hot flashes  loss of appetite  muscle, joint, or bone pains  nausea  redness or irritation at site where injected  skin problems or acne This list may not describe all possible side effects. Call your doctor for medical advice about side effects. You may report side effects to FDA at 1-800-FDA-1088. Where should I keep my medicine? Keep out of the reach of children. Store below 25 degrees C (77 degrees F). Do not freeze. Protect from light. Do not use if it is not clear or if there are particles present. Throw away any unused medicine after the expiration date. NOTE: This sheet is a summary. It may not cover all possible information. If you have questions about this medicine, talk to your doctor, pharmacist, or health care provider.  2020 Elsevier/Gold Standard (2018-03-13 09:52:48)  

## 2019-05-06 NOTE — Progress Notes (Signed)
Hematology and Oncology Follow Up   Wesley White [id number] 22-Oct-1950 68 y.o. 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  He presented with PSA was 10.4 and a Gleason score 4+3 = 7 in 2017.   Prior Therapy:   He is status post laparoscopic radical prostatectomy and pelvic lymphadenectomy on March 05, 2016.  The final pathology showed Gleason score 4+3 equal 7 with 0 out of 9 lymph node involved.  He did have extraprostatic extension at that time.   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  He is status post radiation therapy adjuvantly under the care of Dr. Tammi Klippel completed on May 4 of 2018.  He received 68.4 Gray in 38 fractions.  Status post radiation therapy for isolated left iliac bone metastasis completed in June 2020.  Zytiga 1000 mg daily started in August 2020.  Therapy discontinued because of lack of coverage   Current therapy:   Firmagon started on 11/20/2018.  He was changed to Lupron 30 mg every 4 months started on 12/30/2018.  He will receive Eligard on May 06, 2019.  Wesley White  Interim History: Mr. Wesley White is here for return evaluation.  Since the last visit, he reports no major changes at this time.  He is reporting more pain in his shoulder bilaterally left more than right.  He denies any abdominal pain weight loss and his appetite has been better and eating more.  He does report left hip pain which is related to his previous prostate cancer although pain has not changed dramatically at this time.  He still ambulates without any major difficulties.   Patient denied any alteration mental status, neuropathy, confusion or dizziness.  Denies any headaches or lethargy.  Denies any night sweats, weight loss or changes in appetite.  Denied orthopnea, dyspnea on exertion or chest discomfort.  Denies shortness of  breath, difficulty breathing hemoptysis or cough.  Denies any abdominal distention, nausea, early satiety or dyspepsia.  Denies any hematuria, frequency, dysuria or nocturia.  Denies any skin irritation, dryness or rash.  Denies any ecchymosis or petechiae.  Denies any lymphadenopathy or clotting.  Denies any heat or cold intolerance.  Denies any anxiety or depression.  Remaining review of system is negative.                 Medications: Without any changes on review. Current Outpatient Medications  Medication Sig Dispense Refill  . abiraterone acetate (ZYTIGA) 250 MG tablet TAKE 4 TABLETS (1,000 MG TOTAL) BY MOUTH DAILY. TAKE ON AN EMPTY STOMACH 1 HOUR BEFORE OR 2 HOURS AFTER A MEAL (Patient not taking: Reported on 02/05/2019) 120 tablet 0  . acetaminophen (TYLENOL) 325 MG tablet Take 650 mg by mouth every 6 (six) hours as needed for mild pain.    Wesley White CIALIS 5 MG tablet Take 5 mg by mouth daily.  11  . glucose blood test strip 1 each by Other route as needed. Use as instructed 100 each 11  . HYDROcodone-acetaminophen (NORCO) 5-325 MG tablet Take 1 tablet by mouth every 6 (six) hours as needed for moderate pain. (Patient not taking: Reported on 10/24/2018) 15 tablet 0  . ibuprofen (ADVIL) 600 MG tablet Take 600 mg by mouth every 6 (six) hours as needed.    . INVOKANA 300 MG TABS tablet TAKE 1 TABLET BY MOUTH EVERY DAY BEFORE BREAKFAST 90  tablet 1  . lisinopril (ZESTRIL) 5 MG tablet Take 1 tablet (5 mg total) by mouth daily. 90 tablet 3  . metFORMIN (GLUCOPHAGE) 500 MG tablet TAKE 1 TABLET BY MOUTH TWICE A DAY WITH A MEAL 180 tablet 1  . naproxen sodium (ALEVE) 220 MG tablet Take 220 mg by mouth.    [name] Delica Lancets [id number] MISC See admin instructions.    Wesley White oxyCODONE-acetaminophen (PERCOCET/ROXICET) 5-325 MG tablet Take 1-2 tablets by mouth every 6 (six) hours as needed.    . predniSONE (DELTASONE) 5 MG tablet TAKE 1 TABLET BY MOUTH EVERY DAY WITH BREAKFAST 90 tablet 0  .  rosuvastatin (CRESTOR) 20 MG tablet Take 1 tablet (20 mg total) by mouth daily. 90 tablet 3   No current facility-administered medications for this visit.    Facility-Administered Medications Ordered in Other Visits  Medication Dose Route Frequency Provider Last Rate Last Dose  . magnesium citrate solution 1 Bottle  1 Bottle Oral Once Raynelle Bring, MD      . sodium phosphate (FLEET) 7-19 GM/118ML enema 1 enema  1 enema Rectal Once Raynelle Bring, MD         Allergies: No Known Allergies  Past Medical History, Surgical history, Social history, and Family History unchanged on review.   Physical Exam: Blood pressure 119/76, pulse 75, temperature 98 F (36.7 C), temperature source Temporal, resp. rate 18, height 5' 8.75" (1.746 m), weight 205 lb 4.8 oz (93.1 kg), SpO2 98 %.   ECOG: 1   General appearance: Comfortable appearing without any discomfort Head: Normocephalic without any trauma Oropharynx: Mucous membranes are moist and pink without any thrush or ulcers. Eyes: Pupils are equal and round reactive to light. Lymph nodes: No cervical, supraclavicular, inguinal or axillary lymphadenopathy.   Heart:regular rate and rhythm.  S1 and S2 without leg edema. Lung: Clear without any rhonchi or wheezes.  No dullness to percussion. Abdomin: Soft, nontender, nondistended with good bowel sounds.  No hepatosplenomegaly. Musculoskeletal: No joint deformity or effusion.  Full range of motion noted. Neurological: No deficits noted on motor, sensory and deep tendon reflex exam. Skin: No petechial rash or dryness.  Appeared moist.  Psychiatric: Mood and affect appeared appropriate.          Lab Results: Lab Results  Component Value Date   WBC 5.2 05/06/2019   HGB 14.0 05/06/2019   HCT 41.2 05/06/2019   MCV 92.4 05/06/2019   PLT 201 05/06/2019     Chemistry      Component Value Date/Time   NA 141 02/03/2019 1108   NA 136 10/17/2018 1139   K 4.6 02/03/2019 1108   CL 108  02/03/2019 1108   CO2 26 02/03/2019 1108   BUN 20 02/03/2019 1108   BUN 11 10/17/2018 1139   CREATININE 0.78 02/03/2019 1108   CREATININE 0.81 06/08/2015 0001      Component Value Date/Time   CALCIUM 9.5 02/03/2019 1108   ALKPHOS 72 02/03/2019 1108   AST 12 (L) 02/03/2019 1108   ALT 12 02/03/2019 1108   BILITOT 0.6 02/03/2019 1108       Results for Wesley White, KOEPPEL (MRN [id number]) as of 05/06/2019 11:51  Ref. Range 02/03/2019 11:08  Prostate Specific Ag, Serum Latest Ref Range: 0.0 - 4.0 ng/mL <0.1     Impression and Plan:   68 year old man with:  1.  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  The natural course of his  disease was updated today.  He was not able to continue on Zytiga for financial reasons with alternative options were reviewed at that time.  Additional therapy escalation using Xtandi as well as other options were reviewed and at this time he opted to continue with androgen deprivation therapy alone.  Plan is to repeat imaging studies before the next visit.   2.  Hip pain: Related to prostate cancer that is currently treated.  No recent exacerbation.  3.  Androgen deprivation: He will receive Eligard today and repeated in 4 months.  Long-term complications including hot flashes, weight gain, osteoporosis among others.  4.  Prognosis and goals of care: Therapy remains palliative although aggressive measures are warranted despite his incurable disease.  5.  Osteoporosis prevention: Recommended calcium and vitamin D supplements to prevent development of osteoporosis on androgen deprivation.  Bone directed therapy will be deferred for the time being.   6.  Follow-up: In 4 months for repeat evaluation.  25  minutes was spent with the patient face-to-face today.  More than 50% of time was spent on reviewing his disease status, treatment options and answering questions regarding future plan  of care.Zola Button, MD 05/06/2019 11:49 AM

## 2019-05-07 ENCOUNTER — Telehealth: Payer: Self-pay

## 2019-05-07 ENCOUNTER — Telehealth: Payer: Self-pay | Admitting: Oncology

## 2019-05-07 LAB — PROSTATE-SPECIFIC AG, SERUM (LABCORP): Prostate Specific Ag, Serum: <0.1 ng/mL (ref 0.0–4.0)

## 2019-05-07 NOTE — Telephone Encounter (Signed)
Scheduled appt per 12/9 los.  A calendar will be mailed out/

## 2019-05-07 NOTE — Telephone Encounter (Signed)
-----   Message from [name], MD sent at 05/07/2019  8:44 AM EST ----- Please let him know his PSA is low

## 2019-05-07 NOTE — Telephone Encounter (Signed)
Called and informed patient of information listed below. Patient verbalized understanding. Patient instructed to call office with any questions or concerns.

## 2019-05-20 DIAGNOSIS — M67912 Unspecified disorder of synovium and tendon, left shoulder: Secondary | ICD-10-CM | POA: Diagnosis not present

## 2019-05-25 DIAGNOSIS — M25512 Pain in left shoulder: Secondary | ICD-10-CM | POA: Diagnosis not present

## 2019-05-28 ENCOUNTER — Encounter: Payer: Self-pay | Admitting: Internal Medicine

## 2019-06-01 DIAGNOSIS — M75112 Incomplete rotator cuff tear or rupture of left shoulder, not specified as traumatic: Secondary | ICD-10-CM | POA: Diagnosis not present

## 2019-06-03 DIAGNOSIS — M75112 Incomplete rotator cuff tear or rupture of left shoulder, not specified as traumatic: Secondary | ICD-10-CM | POA: Diagnosis not present

## 2019-06-09 ENCOUNTER — Other Ambulatory Visit: Payer: Self-pay

## 2019-06-09 NOTE — Patient Outreach (Signed)
  [facility]) Care Management Chronic Special Needs Program    06/09/2019  Name: Giovanie Schrack, DOB: Sep 28, 1950  MRN: [id number]   Mr. Shonnon Mcfarlen is enrolled in a chronic special needs plan for Diabetes.Telephone call to client for initial assessment/ HRA review. Unable to reach or leave voice message. Message states voice mail box is not set up.  PLAN; RNCM will attempt 2nd  telephone call to client within  2 weeks.   Quinn Plowman RN,BSN,CCM [facility] [phone]

## 2019-06-10 DIAGNOSIS — M75112 Incomplete rotator cuff tear or rupture of left shoulder, not specified as traumatic: Secondary | ICD-10-CM | POA: Diagnosis not present

## 2019-06-11 DIAGNOSIS — M75112 Incomplete rotator cuff tear or rupture of left shoulder, not specified as traumatic: Secondary | ICD-10-CM | POA: Diagnosis not present

## 2019-06-15 ENCOUNTER — Other Ambulatory Visit: Payer: Self-pay

## 2019-06-15 NOTE — Patient Outreach (Signed)
[facility]) Care Management Chronic Special Needs Program  06/15/2019  Name: Wesley White DOB: [birth year]  MRN: [id number]  Mr. Wesley White is enrolled in a chronic special needs plan for Diabetes. Chronic Care Management Coordinator telephoned client to review health risk assessment and to develop individualized care plan.  Introduced the chronic care management program, importance of client participation, and taking their care plan to all provider appointments and inpatient facilities.    Subjective:Telephone call to patient. HIPAA verified. Client states he was diagnosed with diabetes May 2020.  He reports his primary care provider manages his diabetes. Client reports most recent A1c of  6.5. Client states he checks his blood sugar at least 1 time a day to every other day. He states he does not always check his blood sugar fasting but will check after a later meal.  Client states his blood sugars range from 70 to 140.  He denies having hypoglycemic events. RNCM discussed carbohydrate controlled meal planning. Client states he is eating more of a mediterranean diet.  He states he understands well how to manage his diet. He reports he was able to drop his A1c from 11.4 to 6.5.  Client reports currently having physical therapy 2 times per week for a left rotator cuff tear. He reports taking Aleve for pain as needed. Client states he is currently being treated for prostate cancer. Client states he was unable to take the originally prescribed treatment, Zytiga for his cancer due to the cost.   He states he is under the care of Dr Wesley White and is receiving treatments via shot every 4 months. Client states his next follow up appointment and bone scan is scheduled for April 2021.  Client states he would like to speak with the [facility] care management pharmacist regarding the cancer treatment medication Zytiga for a more affordable program.  RNCM advised client to notify MD of any changes in condition  prior to scheduled appointment. Client advised to contact RNCM as needed and contact their HTA concierge for benefit questions.  RNCM provided client 24 hour HTA nurse advise line number [phone]    Goals Addressed            This Visit's Progress   . Client understands the importance of follow-up with providers by attending scheduled visits       Primary MD visit completed 02/05/19 Oncology visit completed 05/06/19     . Client will verbalize knowledge of self management of Hypertension as evidences by BP reading of 140/90 or less; or as defined by provider       Assessed high blood pressure self management skills.  RN case manager advised client to contact his Health team advantage concierge for possible blood pressure monitor coverage Discussed/ reviewed blood pressure targets.    [name] HEMOGLOBIN A1C < 7.0       Discussed diabetes self management actions:  Glucose monitoring per provider recommendation  Check feet daily  Visit provider every 3-6 months as directed  Hbg A1C level every 3-6 months.  Eye Exam yearly   Carbohydrate controlled meal planning  Taking diabetes medication as prescribed by provider  Physical activity     . Maintain timely refills of diabetic medication as prescribed within the year .      [name] Obtain annual  Lipid Profile, LDL-C      . Obtain Annual Eye (retinal)  Exam       . Obtain Annual Foot Exam      . Obtain  annual screen for micro albuminuria (urine) , nephropathy (kidney problems)      . Obtain Hemoglobin A1C at least 2 times per year      . Visit Primary Care Provider or Endocrinologist at least 2 times per year         Assessment: Client is meeting diabetes self-management goal of hemoglobin A1C of <7.0% with most recent reading of 6.5 % on 02/05/19 without reports of hypoglycemia . Client has good understanding of:  COVID-19 cause, symptoms, precautions (social distancing, stay at home order, hand washing, wear face covering when  unable to maintain or ensure 6 foot social distancing), and symptoms requiring provider notification.  Plan:  Send successful outreach letter with a copy of their individualized care plan, Send individual care plan to provider and Send educational material  Chronic care management coordination will outreach in:  9 Months  Will refer client to:  [place] [facility]/RN [place] Case Manager, C-SNP

## 2019-06-16 ENCOUNTER — Other Ambulatory Visit: Payer: Self-pay | Admitting: Pharmacist

## 2019-06-16 DIAGNOSIS — M75112 Incomplete rotator cuff tear or rupture of left shoulder, not specified as traumatic: Secondary | ICD-10-CM | POA: Diagnosis not present

## 2019-06-16 NOTE — Patient Outreach (Signed)
[place] [facility]) Care Management  [place]   06/16/2019  Wesley White 07-10-1950 [id number]  Reason for referral: Medication Assistance with Zytiga  Referral source: [employer] C-SNP Care Manager with [facility] Current insurance: [employer] C-SNP  PMHx includes but not limited to:  T2DM, prostate cancer s/p prostatectomy, lymphadenectomy '17, metastatic recurrent disease noted 6'20, currently on palliative Eligard therapy and followed by Wesley White  Per review of chart, patient previously on [place] but had to stop due to cost.  Patient assessed by [name] however he did not meet Zytiga patient assistance program requirements or PAN foundation requirements due to income over limit.    Outreach:  Successful telephone call with patient.  HIPAA identifiers verified.   Subjective:  Patient denies need for medication review but would like to see if cost of Zytiga can be lowered.    Objective: The 10-year ASCVD risk score Wesley White DC Wesley White., et al., 2013) is: 25.7%   Values used to calculate the score:     Age: 69 years     Sex: Male     Is Non-Hispanic African American: No     Diabetic: Yes     Tobacco smoker: No     Systolic Blood Pressure: 123456 mmHg     Is BP treated: No     HDL Cholesterol: 55 mg/dL     Total Cholesterol: 224 mg/dL  Lab Results  Component Value Date   CREATININE 0.73 05/06/2019   CREATININE 0.78 02/03/2019   CREATININE 0.78 10/17/2018    Lab Results  Component Value Date   HGBA1C 6.4 (A) 02/05/2019    Lipid Panel     Component Value Date/Time   CHOL 224 (H) 10/24/2018 1206   TRIG 91 10/24/2018 1206   HDL 55 10/24/2018 1206   CHOLHDL 4.1 10/24/2018 1206   CHOLHDL 5.5 (H) 06/08/2015 0001   VLDL 31 (H) 06/08/2015 0001   LDLCALC 151 (H) 10/24/2018 1206    BP Readings from Last 3 Encounters:  05/06/19 119/76  02/05/19 114/70  02/03/19 135/85    No Known Allergies  Medications Reviewed Today     Reviewed by Wesley Karvonen, RN (Registered Nurse) on 06/15/19 at 1441  Med List Status: <None>  Medication Order Taking? Sig Documenting Provider Last Dose Status Informant  abiraterone acetate (ZYTIGA) 250 MG tablet [id number] No TAKE 4 TABLETS (1,000 MG TOTAL) BY MOUTH DAILY. TAKE ON AN EMPTY STOMACH 1 HOUR BEFORE OR 2 HOURS AFTER A MEAL  Patient not taking: Reported on 02/05/2019   Wesley Portela, MD Not Taking Active   acetaminophen (TYLENOL) 325 MG tablet [id number] Yes Take 650 mg by mouth every 6 (six) hours as needed for mild pain. [provider] Taking Active Self  CIALIS 5 MG tablet [id number] No Take 5 mg by mouth daily. [provider] Not Taking Active   glucose blood test strip [id number]  1 each by Other route as needed. Use as instructed Wesley Lung, MD  Active   HYDROcodone-acetaminophen [employer]) 5-325 MG tablet [id number] No Take 1 tablet by mouth every 6 (six) hours as needed for moderate pain.  Patient not taking: Reported on 10/24/2018   Wesley White Not Taking Active   ibuprofen (ADVIL) 600 MG tablet [id number] No Take 600 mg by mouth every 6 (six) hours as needed. [provider] Not Taking Active   INVOKANA 300 MG TABS tablet [id number] Yes TAKE 1 TABLET BY MOUTH EVERY  DAY BEFORE BREAKFAST Wesley Lung, MD Taking Active   lisinopril (ZESTRIL) 5 MG tablet [id number] Yes Take 1 tablet (5 mg total) by mouth daily. Wesley Lung, MD Taking Active   metFORMIN (GLUCOPHAGE) 500 MG tablet [id number] Yes TAKE 1 TABLET BY MOUTH TWICE A DAY WITH A MEAL Wesley Lung, MD Taking Active   naproxen sodium (ALEVE) 220 MG tablet [id number] Yes Take 220 mg by mouth. [provider] Taking Active   OneTouch Delica Lancets [id number] MISC [id number]  See admin instructions. [provider]  Active   oxyCODONE-acetaminophen (PERCOCET/ROXICET) 5-325 MG tablet [id number] No Take 1-2 tablets by mouth every 6 (six) hours as needed. [provider] Not Taking Active   predniSONE (DELTASONE) 5 MG tablet [id number] No TAKE 1 TABLET BY MOUTH EVERY DAY WITH BREAKFAST  Patient not taking: Reported on 06/15/2019   Wesley Portela, MD Not Taking Active   rosuvastatin (CRESTOR) 20 MG tablet [id number] Yes Take 1 tablet (20 mg total) by mouth daily. Wesley Lung, MD Taking Active           Assessment: Patient denied need for review of medications today.   Medication Assistance Findings:  Zytiga: Denied for [name] and [name] in August 2020.  Patient reports that his household income has not changed significantly from 2020 to 2021.  His spouse continues to work and he remains on [name].  We reviewed income limits for [name] and [name] patient assistance program and [name] for [employer]  If spouse Wesley White this year, I encouraged him to reach out to me so eligibility can be re-assessed.  Patient voiced understanding.   Invokana: Reviewed that this is on Tier 6 of HTA C-SNP plan therefore will be no charge until patient reaches coverage gap. Patient voiced understanding.  Also reviewed patient assistance program required as this is another Office manager program medication however this is not considered speciality therefore has additional requirement of out-of-pocket expenditure of 4% of household income.  Also reviewed possible substitution to Jardiance however patient does not meet income requirement for this either (same as J&J).    Patient denies need for assistance with any other medications at this time.    Plan: . Will route note to HTA C-SNP RN  . Will f/u again with patient in 3-4 months  Wesley White, PharmD, [place] [phone]

## 2019-06-18 DIAGNOSIS — M75112 Incomplete rotator cuff tear or rupture of left shoulder, not specified as traumatic: Secondary | ICD-10-CM | POA: Diagnosis not present

## 2019-06-23 DIAGNOSIS — M75112 Incomplete rotator cuff tear or rupture of left shoulder, not specified as traumatic: Secondary | ICD-10-CM | POA: Diagnosis not present

## 2019-06-25 DIAGNOSIS — M75112 Incomplete rotator cuff tear or rupture of left shoulder, not specified as traumatic: Secondary | ICD-10-CM | POA: Diagnosis not present

## 2019-07-01 DIAGNOSIS — M75112 Incomplete rotator cuff tear or rupture of left shoulder, not specified as traumatic: Secondary | ICD-10-CM | POA: Diagnosis not present

## 2019-07-02 DIAGNOSIS — M75112 Incomplete rotator cuff tear or rupture of left shoulder, not specified as traumatic: Secondary | ICD-10-CM | POA: Diagnosis not present

## 2019-07-04 ENCOUNTER — Ambulatory Visit: Payer: HMO | Attending: Internal Medicine

## 2019-07-04 DIAGNOSIS — Z23 Encounter for immunization: Secondary | ICD-10-CM | POA: Insufficient documentation

## 2019-07-04 NOTE — Progress Notes (Signed)
   Covid-19 Vaccination Clinic  Name:  Wesley White    MRN: [id number] DOB: Apr 30, 1951  07/04/2019  [name] was observed post Covid-19 immunization for 15 minutes without incidence. He was provided with Vaccine Information Sheet and instruction to access the V-Safe system.   [name] was instructed to call 911 with any severe reactions post vaccine: [name] Difficulty breathing  . Swelling of your face and throat  . A fast heartbeat  . A bad rash all over your body  . Dizziness and weakness    Immunizations Administered    Name Date Dose VIS Date Route   Pfizer COVID-19 Vaccine 07/04/2019  3:16 PM 0.3 mL 05/08/2019 Intramuscular   Manufacturer: [place]   Lot: [id number]   [place]: [id number]

## 2019-07-05 ENCOUNTER — Ambulatory Visit: Payer: HMO

## 2019-07-06 DIAGNOSIS — M75112 Incomplete rotator cuff tear or rupture of left shoulder, not specified as traumatic: Secondary | ICD-10-CM | POA: Diagnosis not present

## 2019-07-09 DIAGNOSIS — M75112 Incomplete rotator cuff tear or rupture of left shoulder, not specified as traumatic: Secondary | ICD-10-CM | POA: Diagnosis not present

## 2019-07-13 DIAGNOSIS — M75112 Incomplete rotator cuff tear or rupture of left shoulder, not specified as traumatic: Secondary | ICD-10-CM | POA: Diagnosis not present

## 2019-07-13 DIAGNOSIS — M67912 Unspecified disorder of synovium and tendon, left shoulder: Secondary | ICD-10-CM | POA: Diagnosis not present

## 2019-07-20 DIAGNOSIS — M75112 Incomplete rotator cuff tear or rupture of left shoulder, not specified as traumatic: Secondary | ICD-10-CM | POA: Diagnosis not present

## 2019-07-21 ENCOUNTER — Ambulatory Visit: Payer: HMO

## 2019-07-23 DIAGNOSIS — M75112 Incomplete rotator cuff tear or rupture of left shoulder, not specified as traumatic: Secondary | ICD-10-CM | POA: Diagnosis not present

## 2019-07-29 ENCOUNTER — Ambulatory Visit: Payer: HMO | Attending: Internal Medicine

## 2019-07-29 DIAGNOSIS — Z23 Encounter for immunization: Secondary | ICD-10-CM | POA: Insufficient documentation

## 2019-07-29 NOTE — Progress Notes (Signed)
   Covid-19 Vaccination Clinic  Name:  Wesley White    MRN: [id number] DOB: 21-Nov-1950  07/29/2019  Wesley White was observed post Covid-19 immunization for 15 minutes without incident. He was provided with Vaccine Information Sheet and instruction to access the V-Safe system.   Wesley White was instructed to call 911 with any severe reactions post vaccine: [name] Difficulty breathing  . Swelling of face and throat  . A fast heartbeat  . A bad rash all over body  . Dizziness and weakness   Immunizations Administered    Name Date Dose VIS Date Route   Pfizer COVID-19 Vaccine 07/29/2019  3:13 PM 0.3 mL 05/08/2019 Intramuscular   Manufacturer: [place]   Lot: [id number]   [place]: [id number]

## 2019-08-13 ENCOUNTER — Other Ambulatory Visit: Payer: Self-pay

## 2019-08-13 ENCOUNTER — Ambulatory Visit (INDEPENDENT_AMBULATORY_CARE_PROVIDER_SITE_OTHER): Payer: HMO | Admitting: Family Medicine

## 2019-08-13 ENCOUNTER — Encounter: Payer: Self-pay | Admitting: Family Medicine

## 2019-08-13 VITALS — BP 110/74 | HR 87 | Temp 98.2°F | Wt 210.0 lb

## 2019-08-13 DIAGNOSIS — E66811 Obesity, class 1: Secondary | ICD-10-CM

## 2019-08-13 DIAGNOSIS — G4733 Obstructive sleep apnea (adult) (pediatric): Secondary | ICD-10-CM

## 2019-08-13 DIAGNOSIS — Z87442 Personal history of urinary calculi: Secondary | ICD-10-CM

## 2019-08-13 DIAGNOSIS — E669 Obesity, unspecified: Secondary | ICD-10-CM | POA: Diagnosis not present

## 2019-08-13 DIAGNOSIS — E119 Type 2 diabetes mellitus without complications: Secondary | ICD-10-CM | POA: Diagnosis not present

## 2019-08-13 DIAGNOSIS — E785 Hyperlipidemia, unspecified: Secondary | ICD-10-CM

## 2019-08-13 DIAGNOSIS — C61 Malignant neoplasm of prostate: Secondary | ICD-10-CM | POA: Diagnosis not present

## 2019-08-13 DIAGNOSIS — Z9989 Dependence on other enabling machines and devices: Secondary | ICD-10-CM | POA: Diagnosis not present

## 2019-08-13 DIAGNOSIS — N5231 Erectile dysfunction following radical prostatectomy: Secondary | ICD-10-CM | POA: Insufficient documentation

## 2019-08-13 DIAGNOSIS — E1169 Type 2 diabetes mellitus with other specified complication: Secondary | ICD-10-CM | POA: Diagnosis not present

## 2019-08-13 DIAGNOSIS — C7951 Secondary malignant neoplasm of bone: Secondary | ICD-10-CM | POA: Diagnosis not present

## 2019-08-13 LAB — POCT GLYCOSYLATED HEMOGLOBIN (HGB A1C): Hemoglobin A1C: 6.3 % — AB (ref 4.0–5.6)

## 2019-08-13 LAB — POCT UA - MICROALBUMIN
Albumin/Creatinine Ratio, Urine, POC: <5
Creatinine, POC: 100.2 mg/dL
Microalbumin Ur, POC: <5 mg/L

## 2019-08-13 NOTE — Progress Notes (Signed)
  Subjective:    Patient ID: Wesley White, male    DOB: [birth year], 69 y.o.   MRN: [id number]  Wesley White is a 69 y.o. male who presents for follow-up of Type 2 diabetes mellitus.  Home blood sugar records: pt keeps a log  90-199 2 hours post meal Current symptoms/problems include none at this time. Daily foot checks: yes   Any foot concerns: none Exercise: limited Diet: regular and healthy He is having some difficulty with erectile dysfunction.  He has tried Cialis without much luck.  He is also apparently tried a pump unsuccessfully.  Does have underlying OSA and does use a CPAP.  He has not gotten a readout on in quite some time.  He does follow-up regularly with urology concerning his prostate cancer.  He does have a remote history of kidney stones. The following portions of the patient's history were reviewed and updated as appropriate: allergies, current medications, past medical history, past social history and problem list.  ROS as in subjective above.     Objective:    Physical Exam Alert and in no distress otherwise not examined.  Hemoglobin A1c is 6.3 Lab Review Diabetic Labs Latest Ref Rng & Units 05/06/2019 02/05/2019 02/03/2019 10/24/2018 10/17/2018  HbA1c 4.0 - 5.6 % - 6.4(A) - - 11.3(H)  Chol 100 - 199 mg/dL - - - 224(H) -  HDL >39 mg/dL - - - 55 -  Calc LDL 0 - 99 mg/dL - - - 151(H) -  Triglycerides 0 - 149 mg/dL - - - 91 -  Creatinine 0.61 - 1.24 mg/dL 0.73 - 0.78 - 0.78   BP/Weight 05/06/2019 02/05/2019 02/03/2019 12/30/2018 [id number]  Systolic BP 123456 [id number] 0000000 A999333  Diastolic BP 76 70 85 72 89  Wt. (Lbs) 205.3 198.4 199.8 - 194.7  BMI 30.54 29.51 28.67 - 27.94   Foot/eye exam completion dates Latest Ref Rng & Units 02/17/2019 02/17/2019  Eye Exam No Retinopathy No Retinopathy No Retinopathy  Foot Form Completion - - -    Wesley White  reports that he has never smoked. He has never used smokeless tobacco. He reports current alcohol use. He reports that he does not use drugs.      Assessment & Plan:    Controlled type 2 diabetes mellitus without complication, without long-term current use of insulin ([name]) - Plan: POCT glycosylated hemoglobin (Hb A1C), POCT UA - Microalbumin  Prostate cancer (HCC)  Malignant neoplasm of prostate metastatic to bone (HCC)  Obesity (BMI 30.0-34.9)  OSA on CPAP  History of kidney stones  Hyperlipidemia associated with type 2 diabetes mellitus (HCC)  Erectile dysfunction after radical prostatectomy    1. Rx changes: none 2. Education: Reviewed 'ABCs' of diabetes management (respective goals in parentheses):  A1C (<7), blood pressure (<130/80), and cholesterol (LDL <100). 3. Compliance at present is estimated to be excellent. Efforts to improve compliance (if necessary) will be directed at increased exercise. 4. Follow up: 4 months Also recommend he talk to his urologist about other options concerning erections.  He is to get a readout on his CPAP.  Return here in roughly 4 months.

## 2019-08-14 ENCOUNTER — Other Ambulatory Visit: Payer: Self-pay | Admitting: Pharmacist

## 2019-08-14 NOTE — Patient Outreach (Signed)
[facility] ([facility])  [facility] Quality Pharmacy Team    [facility] pharmacy case will be closed as our team is transitioning from the [facility] Care Management Department into the [facility] Quality Department and will no longer be using [facility] for documentation purposes.     Karlisha Mathena, PharmD, BCPS Clinical Pharmacist [facility] [phone]     

## 2019-09-04 ENCOUNTER — Other Ambulatory Visit: Payer: Self-pay

## 2019-09-04 ENCOUNTER — Inpatient Hospital Stay: Payer: HMO | Attending: Oncology

## 2019-09-04 DIAGNOSIS — Z79899 Other long term (current) drug therapy: Secondary | ICD-10-CM | POA: Insufficient documentation

## 2019-09-04 DIAGNOSIS — M25559 Pain in unspecified hip: Secondary | ICD-10-CM | POA: Diagnosis not present

## 2019-09-04 DIAGNOSIS — Z923 Personal history of irradiation: Secondary | ICD-10-CM | POA: Insufficient documentation

## 2019-09-04 DIAGNOSIS — C61 Malignant neoplasm of prostate: Secondary | ICD-10-CM | POA: Diagnosis not present

## 2019-09-04 DIAGNOSIS — Z5111 Encounter for antineoplastic chemotherapy: Secondary | ICD-10-CM | POA: Insufficient documentation

## 2019-09-04 DIAGNOSIS — I700 Atherosclerosis of aorta: Secondary | ICD-10-CM | POA: Insufficient documentation

## 2019-09-04 DIAGNOSIS — K760 Fatty (change of) liver, not elsewhere classified: Secondary | ICD-10-CM | POA: Diagnosis not present

## 2019-09-04 DIAGNOSIS — C7951 Secondary malignant neoplasm of bone: Secondary | ICD-10-CM | POA: Diagnosis not present

## 2019-09-04 DIAGNOSIS — R55 Syncope and collapse: Secondary | ICD-10-CM | POA: Insufficient documentation

## 2019-09-04 LAB — CMP (CANCER CENTER ONLY)
ALT: 24 U/L (ref 0–44)
AST: 19 U/L (ref 15–41)
Albumin: 3.9 g/dL (ref 3.5–5.0)
Alkaline Phosphatase: 89 U/L (ref 38–126)
Anion gap: 5 (ref 5–15)
BUN: 16 mg/dL (ref 8–23)
CO2: 28 mmol/L (ref 22–32)
Calcium: 10 mg/dL (ref 8.9–10.3)
Chloride: 106 mmol/L (ref 98–111)
Creatinine: 0.77 mg/dL (ref 0.61–1.24)
GFR, Est AFR Am: >60 mL/min (ref >60)
GFR, Estimated: >60 mL/min (ref >60)
Glucose, Bld: 100 mg/dL — ABNORMAL HIGH (ref 70–99)
Potassium: 4.6 mmol/L (ref 3.5–5.1)
Sodium: 139 mmol/L (ref 135–145)
Total Bilirubin: 0.5 mg/dL (ref 0.3–1.2)
Total Protein: 7.4 g/dL (ref 6.5–8.1)

## 2019-09-04 LAB — CBC WITH DIFFERENTIAL (CANCER CENTER ONLY)
Abs Immature Granulocytes: 0.02 10*3/uL (ref 0.00–0.07)
Basophils Absolute: 0 10*3/uL (ref 0.0–0.1)
Basophils Relative: 1 %
Eosinophils Absolute: 0.1 10*3/uL (ref 0.0–0.5)
Eosinophils Relative: 1 %
HCT: 45.1 % (ref 39.0–52.0)
Hemoglobin: 15 g/dL (ref 13.0–17.0)
Immature Granulocytes: 0 %
Lymphocytes Relative: 22 %
Lymphs Abs: 1.3 10*3/uL (ref 0.7–4.0)
MCH: 31.2 pg (ref 26.0–34.0)
MCHC: 33.3 g/dL (ref 30.0–36.0)
MCV: 93.8 fL (ref 80.0–100.0)
Monocytes Absolute: 0.6 10*3/uL (ref 0.1–1.0)
Monocytes Relative: 10 %
Neutro Abs: 3.8 10*3/uL (ref 1.7–7.7)
Neutrophils Relative %: 66 %
Platelet Count: 232 10*3/uL (ref 150–400)
RBC: 4.81 MIL/uL (ref 4.22–5.81)
RDW: 13.1 % (ref 11.5–15.5)
WBC Count: 5.7 10*3/uL (ref 4.0–10.5)
nRBC: 0 % (ref 0.0–0.2)

## 2019-09-05 LAB — PROSTATE-SPECIFIC AG, SERUM (LABCORP): Prostate Specific Ag, Serum: <0.1 ng/mL (ref 0.0–4.0)

## 2019-09-07 ENCOUNTER — Ambulatory Visit (HOSPITAL_COMMUNITY)
Admission: RE | Admit: 2019-09-07 | Discharge: 2019-09-07 | Disposition: A | Discharge status: Home / Self Care | Payer: HMO | Source: Ambulatory Visit | Attending: Oncology | Admitting: Oncology

## 2019-09-07 ENCOUNTER — Encounter (HOSPITAL_COMMUNITY): Payer: Self-pay

## 2019-09-07 ENCOUNTER — Other Ambulatory Visit: Payer: Self-pay

## 2019-09-07 DIAGNOSIS — C61 Malignant neoplasm of prostate: Secondary | ICD-10-CM | POA: Insufficient documentation

## 2019-09-07 DIAGNOSIS — C7951 Secondary malignant neoplasm of bone: Secondary | ICD-10-CM | POA: Insufficient documentation

## 2019-09-07 DIAGNOSIS — C7989 Secondary malignant neoplasm of other specified sites: Secondary | ICD-10-CM | POA: Diagnosis not present

## 2019-09-07 DIAGNOSIS — Z8546 Personal history of malignant neoplasm of prostate: Secondary | ICD-10-CM | POA: Diagnosis not present

## 2019-09-07 MED ORDER — TECHNETIUM TC 99M MEDRONATE IV KIT
22.0000 | PACK | Freq: Once | INTRAVENOUS | Status: AC | PRN
  Administered 2019-09-07: 22 via INTRAVENOUS

## 2019-09-07 MED ORDER — IOHEXOL 300 MG/ML  SOLN
100.0000 mL | Freq: Once | INTRAMUSCULAR | Status: AC | PRN
  Administered 2019-09-07: 100 mL via INTRAVENOUS

## 2019-09-07 MED ORDER — SODIUM CHLORIDE (PF) 0.9 % IJ SOLN
INTRAMUSCULAR | Status: AC
  Filled 2019-09-07: qty 50

## 2019-09-08 ENCOUNTER — Ambulatory Visit: Payer: Self-pay | Admitting: Family Medicine

## 2019-09-11 ENCOUNTER — Inpatient Hospital Stay: Payer: HMO

## 2019-09-11 ENCOUNTER — Inpatient Hospital Stay: Payer: HMO | Admitting: Oncology

## 2019-09-11 ENCOUNTER — Other Ambulatory Visit: Payer: Self-pay

## 2019-09-11 VITALS — BP 109/74 | HR 72 | Temp 98.0°F | Resp 20 | Ht 68.0 in | Wt 210.7 lb

## 2019-09-11 DIAGNOSIS — C61 Malignant neoplasm of prostate: Secondary | ICD-10-CM | POA: Diagnosis not present

## 2019-09-11 DIAGNOSIS — Z5111 Encounter for antineoplastic chemotherapy: Secondary | ICD-10-CM | POA: Diagnosis not present

## 2019-09-11 DIAGNOSIS — C7951 Secondary malignant neoplasm of bone: Secondary | ICD-10-CM | POA: Diagnosis not present

## 2019-09-11 MED ORDER — LEUPROLIDE ACETATE (4 MONTH) 30 MG ~~LOC~~ KIT
30.0000 mg | PACK | Freq: Once | SUBCUTANEOUS | Status: AC
  Administered 2019-09-11: 30 mg via SUBCUTANEOUS
  Filled 2019-09-11: qty 30

## 2019-09-11 NOTE — Patient Instructions (Signed)

## 2019-09-11 NOTE — Progress Notes (Signed)
Hematology and Oncology Follow Up   Wesley White [id number] 05/19/1951 69 y.o. 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  He has castration-sensitive after initially diagnosed with localized therapy in 2017, PSA was 10.4 and a Gleason score 4+3 = 7.   Prior Therapy:   He is status post laparoscopic radical prostatectomy and pelvic lymphadenectomy on March 05, 2016.  The final pathology showed Gleason score 4+3 equal 7 with 0 out of 9 lymph node involved.  He did have extraprostatic extension at that time.   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  He is status post radiation therapy adjuvantly under the care of Dr. Tammi White completed on May 4 of 2018.  He received 68.4 Gray in 38 fractions.  Status post radiation therapy for isolated left iliac bone metastasis completed in June 2020.  Zytiga 1000 mg daily started in August 2020.  Therapy discontinued because of lack of coverage   Current therapy:   Firmagon started on 11/20/2018.  He is currently on Eligard and scheduled to receive next injection on September 11, 2019.  .  Interim History: Wesley White is here for a follow-up visit.  Since the last visit, he reports no major changes in his health.  He continues to have intermittent flank and right pelvic discomfort but manageable overall and rarely takes any medication.  He has not had any pain today or in the last 24 hours.  He continues to perform activities of daily living without any decline.  His appetite remains reasonable.  He denies any recent hospitalizations or illnesses.                 Medications: Updated on review. Current Outpatient Medications  Medication Sig Dispense Refill  . abiraterone acetate (ZYTIGA) 250 MG tablet TAKE 4 TABLETS (1,000 MG TOTAL) BY MOUTH DAILY. TAKE ON AN EMPTY STOMACH 1 HOUR BEFORE  OR 2 HOURS AFTER A MEAL (Patient not taking: Reported on 02/05/2019) 120 tablet 0  . acetaminophen (TYLENOL) 325 MG tablet Take 650 mg by mouth every 6 (six) hours as needed for mild pain.    [name] CIALIS 5 MG tablet Take 5 mg by mouth daily.  11  . glucose blood test strip 1 each by Other route as needed. Use as instructed 100 each 11  . HYDROcodone-acetaminophen (NORCO) 5-325 MG tablet Take 1 tablet by mouth every 6 (six) hours as needed for moderate pain. (Patient not taking: Reported on 10/24/2018) 15 tablet 0  . ibuprofen (ADVIL) 600 MG tablet Take 600 mg by mouth every 6 (six) hours as needed.    . INVOKANA 300 MG TABS tablet TAKE 1 TABLET BY MOUTH EVERY DAY BEFORE BREAKFAST 90 tablet 1  . lisinopril (ZESTRIL) 5 MG tablet Take 1 tablet (5 mg total) by mouth daily. 90 tablet 3  . metFORMIN (GLUCOPHAGE) 500 MG tablet TAKE 1 TABLET BY MOUTH TWICE A DAY WITH A MEAL 180 tablet 1  . naproxen sodium (ALEVE) 220 MG tablet Take 220 mg by mouth.    [name] Delica Lancets [id number] MISC See admin instructions.    [name] oxyCODONE-acetaminophen (PERCOCET/ROXICET) 5-325 MG tablet Take 1-2 tablets by mouth every 6 (six) hours as needed.    . predniSONE (DELTASONE) 5 MG tablet TAKE 1 TABLET BY MOUTH EVERY DAY WITH BREAKFAST (Patient not taking: Reported on 06/15/2019) 90 tablet 0  .  rosuvastatin (CRESTOR) 20 MG tablet Take 1 tablet (20 mg total) by mouth daily. 90 tablet 3   No current facility-administered medications for this visit.   Facility-Administered Medications Ordered in Other Visits  Medication Dose Route Frequency Provider Last Rate Last Admin  . magnesium citrate solution 1 Bottle  1 Bottle Oral Once Wesley Bring, MD      . sodium phosphate (FLEET) 7-19 GM/118ML enema 1 enema  1 enema Rectal Once Wesley Bring, MD         Allergies: No Known Allergies    Physical Exam: Blood pressure 109/74, pulse 72, temperature 98 F (36.7 C), temperature source Temporal, resp. rate 20, height 5\' 8"  (1.727  m), weight 210 lb 11.2 oz (95.6 kg), SpO2 98 %.    ECOG: 1    General appearance: Alert, awake without any distress. Head: Atraumatic without abnormalities Oropharynx: Without any thrush or ulcers. Eyes: No scleral icterus. Lymph nodes: No lymphadenopathy noted in the cervical, supraclavicular, or axillary nodes Heart:regular rate and rhythm, without any murmurs or gallops.   White: Clear to auscultation without any rhonchi, wheezes or dullness to percussion. Abdomin: Soft, nontender without any shifting dullness or ascites. Musculoskeletal: No clubbing or cyanosis. Neurological: No motor or sensory deficits. Skin: No rashes or lesions.           Lab Results: Lab Results  Component Value Date   WBC 5.7 09/04/2019   HGB 15.0 09/04/2019   HCT 45.1 09/04/2019   MCV 93.8 09/04/2019   PLT 232 09/04/2019     Chemistry      Component Value Date/Time   NA 139 09/04/2019 1045   NA 136 10/17/2018 1139   K 4.6 09/04/2019 1045   CL 106 09/04/2019 1045   CO2 28 09/04/2019 1045   BUN 16 09/04/2019 1045   BUN 11 10/17/2018 1139   CREATININE 0.77 09/04/2019 1045   CREATININE 0.81 06/08/2015 0001      Component Value Date/Time   CALCIUM 10.0 09/04/2019 1045   ALKPHOS 89 09/04/2019 1045   AST 19 09/04/2019 1045   ALT 24 09/04/2019 1045   BILITOT 0.5 09/04/2019 1045       Results for Wesley White, Wesley White (MRN [id number]) as of 09/11/2019 10:18  Ref. Range 09/04/2019 10:45  Prostate Specific Ag, Serum Latest Ref Range: 0.0 - 4.0 ng/mL <0.1   IMPRESSION: 1. Improved uptake over the left iliac bone compatible with area of known metastatic disease. New focal uptake over the right side of the L3 level corresponding to sclerosis over the right L3 facet likely metastatic.  2. Faint focal uptake over the right posterior seventh rib with subtle diffuse uptake over the sternum and patchy uptake over the anterior ribs unchanged as this may be due to metastatic  disease.  IMPRESSION: 1. New foci of sclerosis in T12, L2 and S1, highly indicative of metastatic disease. Treated metastatic disease in the left iliac wing. 2. Left external iliac lymph node is subcentimeter in size but minimally enlarged from 10/30/2018. Continued attention on follow-up exams is warranted. 3. Hepatic steatosis. 4.  Aortic atherosclerosis (ICD10-I70.0).   Impression and Plan:   69 year old man with:  1.  Advanced prostate cancer with disease to the bone including left iliac as well as axial skeletal spine.  He has castration-sensitive disease at this time.  He is currently on androgen deprivation therapy alone after unable to get Zytiga.  Risks and benefits of continuing this therapy versus therapy escalation with systemic chemotherapy for Xtandi among other  options were reviewed.  Imaging studies including CT scan and bone scan from September 07, 2019 were discussed at this time which showed no high volume disease at this time.  At this time, we have elected to continue with androgen deprivation therapy alone.  I feel that these lesions on bone scan were there prior to his androgen deprivation therapy.  2.  Hip pain: Very little noted at this time.  Will consider radiation therapy if any other areas of discomfort arises.   3.  Androgen deprivation: He is currently on Eligard with no complications.  He will receive one today and repeated in 4 months.  Potential issues such as weight gain, hot flashes and osteoporosis were reiterated.  4.  Prognosis and goals of care: His disease is incurable although aggressive measures are warranted at this time.   5.  Osteoporosis prevention: He is currently on calcium and vitamin D supplements which I recommended continuing for the time being.   6.  Follow-up: In 4 months for a follow-up.  30  minutes were dedicated to this visit. The time was spent on reviewing laboratory data, imaging studies, discussing treatment options,  and answering questions regarding future plan.    Zola Button, MD 09/11/2019 10:16 AM

## 2019-09-14 ENCOUNTER — Telehealth: Payer: Self-pay | Admitting: Oncology

## 2019-09-14 NOTE — Telephone Encounter (Signed)
Scheduled appt per 4/16 los.  Spoke with pt and he is aware of the appt date and time.

## 2019-09-28 ENCOUNTER — Ambulatory Visit: Payer: HMO | Admitting: Pharmacist

## 2019-10-24 ENCOUNTER — Other Ambulatory Visit: Payer: Self-pay | Admitting: Family Medicine

## 2019-10-24 DIAGNOSIS — E119 Type 2 diabetes mellitus without complications: Secondary | ICD-10-CM

## 2019-10-26 ENCOUNTER — Other Ambulatory Visit: Payer: Self-pay | Admitting: Family Medicine

## 2019-10-26 DIAGNOSIS — E1169 Type 2 diabetes mellitus with other specified complication: Secondary | ICD-10-CM

## 2019-10-26 DIAGNOSIS — E119 Type 2 diabetes mellitus without complications: Secondary | ICD-10-CM

## 2019-10-26 DIAGNOSIS — E785 Hyperlipidemia, unspecified: Secondary | ICD-10-CM

## 2019-10-27 ENCOUNTER — Other Ambulatory Visit: Payer: Self-pay | Admitting: Family Medicine

## 2019-10-27 DIAGNOSIS — E119 Type 2 diabetes mellitus without complications: Secondary | ICD-10-CM

## 2019-11-02 ENCOUNTER — Telehealth: Payer: Self-pay

## 2019-11-02 NOTE — Telephone Encounter (Signed)
Spoke to wife about cpap machine issues Will try to reach out to [facility] to see if we could get a new machine. [place]

## 2019-11-05 ENCOUNTER — Telehealth: Payer: Self-pay

## 2019-11-05 NOTE — Telephone Encounter (Signed)
LVM advising that we cant not find the vender that supplied him with his CPAP machine. We may need to start over with a sleep study to get pt a new machine. Awaiting call back from wife. [place]

## 2019-12-22 ENCOUNTER — Other Ambulatory Visit: Payer: Self-pay

## 2019-12-23 ENCOUNTER — Ambulatory Visit: Payer: HMO | Admitting: Family Medicine

## 2019-12-29 ENCOUNTER — Other Ambulatory Visit: Payer: Self-pay | Admitting: Oncology

## 2019-12-29 DIAGNOSIS — C61 Malignant neoplasm of prostate: Secondary | ICD-10-CM

## 2019-12-31 ENCOUNTER — Inpatient Hospital Stay: Payer: HMO

## 2019-12-31 ENCOUNTER — Other Ambulatory Visit: Payer: Self-pay

## 2019-12-31 ENCOUNTER — Inpatient Hospital Stay: Payer: HMO | Admitting: Oncology

## 2019-12-31 ENCOUNTER — Inpatient Hospital Stay: Payer: HMO | Attending: Oncology

## 2019-12-31 VITALS — BP 123/88 | HR 79 | Temp 97.5°F | Resp 18 | Ht 68.0 in | Wt 214.3 lb

## 2019-12-31 DIAGNOSIS — C61 Malignant neoplasm of prostate: Secondary | ICD-10-CM | POA: Diagnosis not present

## 2019-12-31 DIAGNOSIS — M549 Dorsalgia, unspecified: Secondary | ICD-10-CM

## 2019-12-31 DIAGNOSIS — Z79899 Other long term (current) drug therapy: Secondary | ICD-10-CM | POA: Diagnosis not present

## 2019-12-31 DIAGNOSIS — M546 Pain in thoracic spine: Secondary | ICD-10-CM | POA: Diagnosis not present

## 2019-12-31 DIAGNOSIS — Z923 Personal history of irradiation: Secondary | ICD-10-CM | POA: Insufficient documentation

## 2019-12-31 DIAGNOSIS — C7951 Secondary malignant neoplasm of bone: Secondary | ICD-10-CM | POA: Diagnosis not present

## 2019-12-31 LAB — CMP (CANCER CENTER ONLY)
ALT: 27 U/L (ref 0–44)
AST: 24 U/L (ref 15–41)
Albumin: 3.9 g/dL (ref 3.5–5.0)
Alkaline Phosphatase: 260 U/L — ABNORMAL HIGH (ref 38–126)
Anion gap: 9 (ref 5–15)
BUN: 11 mg/dL (ref 8–23)
CO2: 26 mmol/L (ref 22–32)
Calcium: 10.4 mg/dL — ABNORMAL HIGH (ref 8.9–10.3)
Chloride: 103 mmol/L (ref 98–111)
Creatinine: 0.79 mg/dL (ref 0.61–1.24)
GFR, Est AFR Am: >60 mL/min (ref >60)
GFR, Estimated: >60 mL/min (ref >60)
Glucose, Bld: 138 mg/dL — ABNORMAL HIGH (ref 70–99)
Potassium: 4.8 mmol/L (ref 3.5–5.1)
Sodium: 138 mmol/L (ref 135–145)
Total Bilirubin: 0.6 mg/dL (ref 0.3–1.2)
Total Protein: 7.3 g/dL (ref 6.5–8.1)

## 2019-12-31 LAB — CBC WITH DIFFERENTIAL (CANCER CENTER ONLY)
Abs Immature Granulocytes: 0.03 10*3/uL (ref 0.00–0.07)
Basophils Absolute: 0 10*3/uL (ref 0.0–0.1)
Basophils Relative: 1 %
Eosinophils Absolute: 0 10*3/uL (ref 0.0–0.5)
Eosinophils Relative: 1 %
HCT: 43.9 % (ref 39.0–52.0)
Hemoglobin: 14.7 g/dL (ref 13.0–17.0)
Immature Granulocytes: 1 %
Lymphocytes Relative: 23 %
Lymphs Abs: 1.1 10*3/uL (ref 0.7–4.0)
MCH: 30.9 pg (ref 26.0–34.0)
MCHC: 33.5 g/dL (ref 30.0–36.0)
MCV: 92.2 fL (ref 80.0–100.0)
Monocytes Absolute: 0.5 10*3/uL (ref 0.1–1.0)
Monocytes Relative: 10 %
Neutro Abs: 3.1 10*3/uL (ref 1.7–7.7)
Neutrophils Relative %: 64 %
Platelet Count: 226 10*3/uL (ref 150–400)
RBC: 4.76 MIL/uL (ref 4.22–5.81)
RDW: 13.2 % (ref 11.5–15.5)
WBC Count: 4.8 10*3/uL (ref 4.0–10.5)
nRBC: 0 % (ref 0.0–0.2)

## 2019-12-31 MED ORDER — DENOSUMAB 120 MG/1.7ML ~~LOC~~ SOLN
SUBCUTANEOUS | Status: AC
  Filled 2019-12-31: qty 1.7

## 2019-12-31 MED ORDER — DENOSUMAB 120 MG/1.7ML ~~LOC~~ SOLN: 120.0000 mg | Freq: Once | SUBCUTANEOUS | Status: DC

## 2019-12-31 MED ORDER — LEUPROLIDE ACETATE (4 MONTH) 30 MG ~~LOC~~ KIT
PACK | SUBCUTANEOUS | Status: AC
  Filled 2019-12-31: qty 30

## 2019-12-31 MED ORDER — LEUPROLIDE ACETATE (4 MONTH) 30 MG ~~LOC~~ KIT
30.0000 mg | PACK | Freq: Once | SUBCUTANEOUS | Status: AC
  Administered 2019-12-31: 30 mg via SUBCUTANEOUS

## 2019-12-31 NOTE — Progress Notes (Signed)
Hematology and Oncology Follow Up   Marquies Wanat [id number] 1951/01/06 69 y.o. 12/31/2019 7:42 AM [name], MDLalonde, [name], MD     Principle Diagnosis: 69 year old man with castration-sensitive advanced prostate cancer with disease to the bone diagnosed in June 2022.  He presented with localized disease, PSA was 10.4 and a Gleason score 4+3 = 7 2017.   Prior Therapy:   He is status post laparoscopic radical prostatectomy and pelvic lymphadenectomy on March 05, 2016.  The final pathology showed Gleason score 4+3 equal 7 with 0 out of 9 lymph node involved.  He did have extraprostatic extension at that time.   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  He is status post radiation therapy adjuvantly under the care of Dr. Tammi Klippel completed on May 4 of 2018.  He received 68.4 Gray in 38 fractions.  Status post radiation therapy for isolated left iliac bone metastasis completed in June 2020.  Zytiga 1000 mg daily started in August 2020.  Therapy discontinued because of lack of coverage   Current therapy:   Firmagon started on 11/20/2018.  He received Eligard in April 2021 will be repeated in August.  .  Interim History: Mr. Ledyard returns today for a follow-up evaluation.  Since last visit, he reports feeling reasonably well up till the last 2 weeks.  He started noticing intermittent and nagging pain in the middle and lower part of his back.  This pain has become more consistent in the last week.  Not associated with any neurological deficits or weakness.  He has required Tylenol periodically to deal with it.  He did not report any neuropathy or changes in his bowel or bladder function.  Continues to ambulate without any difficulties.  He has also periodically reported some discomfort in his right hip.                Medications: Unchanged on review. Current Outpatient Medications  Medication Sig Dispense Refill  . acetaminophen (TYLENOL) 325  MG tablet Take 650 mg by mouth every 6 (six) hours as needed for mild pain.    [name] CIALIS 5 MG tablet Take 5 mg by mouth daily.  11  . glucose blood test strip 1 each by Other route as needed. Use as instructed 100 each 11  . HYDROcodone-acetaminophen (NORCO) 5-325 MG tablet Take 1 tablet by mouth every 6 (six) hours as needed for moderate pain. (Patient not taking: Reported on 10/24/2018) 15 tablet 0  . ibuprofen (ADVIL) 600 MG tablet Take 600 mg by mouth every 6 (six) hours as needed.    . INVOKANA 300 MG TABS tablet TAKE 1 TABLET BY MOUTH EVERY DAY BEFORE BREAKFAST 90 tablet 1  . lisinopril (ZESTRIL) 5 MG tablet TAKE 1 TABLET BY MOUTH EVERY DAY 90 tablet 3  . metFORMIN (GLUCOPHAGE) 500 MG tablet TAKE 1 TABLET BY MOUTH TWICE A DAY WITH A MEAL 180 tablet 1  . naproxen sodium (ALEVE) 220 MG tablet Take 220 mg by mouth.    [name] Delica Lancets [id number] MISC See admin instructions.    . rosuvastatin (CRESTOR) 20 MG tablet TAKE 1 TABLET BY MOUTH EVERY DAY 90 tablet 3   No current facility-administered medications for this visit.   Facility-Administered Medications Ordered in Other Visits  Medication Dose Route Frequency Provider Last Rate Last Admin  . magnesium citrate solution 1 Bottle  1 Bottle Oral Once Raynelle Bring, MD      . sodium phosphate (FLEET) 7-19 GM/118ML  enema 1 enema  1 enema Rectal Once Raynelle Bring, MD         Allergies: No Known Allergies    Physical Exam:  Blood pressure 123/88, pulse 79, temperature (!) 97.5 F (36.4 C), temperature source Temporal, resp. rate 18, height 5\' 8"  (1.727 m), weight 214 lb 4.8 oz (97.2 kg), SpO2 96 %.    ECOG: 1    General appearance: Comfortable appearing without any discomfort Head: Normocephalic without any trauma Oropharynx: Mucous membranes are moist and pink without any thrush or ulcers. Eyes: Pupils are equal and round reactive to light. Lymph nodes: No cervical, supraclavicular, inguinal or axillary lymphadenopathy.    Heart:regular rate and rhythm.  S1 and S2 without leg edema. Lung: Clear without any rhonchi or wheezes.  No dullness to percussion. Abdomin: Soft, nontender, nondistended with good bowel sounds.  No hepatosplenomegaly. Musculoskeletal: No joint deformity or effusion.  Full range of motion noted. No point tenderness noted on his back.  No elicited pain in his right hip. Neurological: No deficits noted on motor, sensory and deep tendon reflex exam.  He is able to ambulate without any difficulties. Skin: No petechial rash or dryness.  Appeared moist.             Lab Results: Lab Results  Component Value Date   WBC 5.7 09/04/2019   HGB 15.0 09/04/2019   HCT 45.1 09/04/2019   MCV 93.8 09/04/2019   PLT 232 09/04/2019     Chemistry      Component Value Date/Time   NA 139 09/04/2019 1045   NA 136 10/17/2018 1139   K 4.6 09/04/2019 1045   CL 106 09/04/2019 1045   CO2 28 09/04/2019 1045   BUN 16 09/04/2019 1045   BUN 11 10/17/2018 1139   CREATININE 0.77 09/04/2019 1045   CREATININE 0.81 06/08/2015 0001      Component Value Date/Time   CALCIUM 10.0 09/04/2019 1045   ALKPHOS 89 09/04/2019 1045   AST 19 09/04/2019 1045   ALT 24 09/04/2019 1045   BILITOT 0.5 09/04/2019 1045        Results for PERFECTO, PURDY (MRN [id number]) as of 12/31/2019 07:42  Ref. Range 05/06/2019 11:37 09/04/2019 10:45  Prostate Specific Ag, Serum Latest Ref Range: 0.0 - 4.0 ng/mL <0.1 <0.1    Impression and Plan:   69 year old man with:  1.  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    The natural course of this disease was reviewed at this time.  His PSA in April 2021 was undetectable indicating adequate response to therapy.  Therapy escalation has been discussed previously and updated today and he has declined any additional treatment for the time being.  Based therapies will certainly be needed if he developed castration-resistant disease based on PSA  reading and repeat imaging studies.  He is agreeable with this plan and will await results of repeat imaging and PSA before making determination.    2.  Back pain: Imaging studies from April 2021 personally reviewed today.  He does have mild lower thoracic and lumbar spine involvement based on CT scan and bone scan.  I recommend repeat imaging studies and consider radiation therapy for palliative purposes.  11 MRI of the thoracic and lumbar spine in the immediate future.  I urged him to continue take Tylenol or NSAIDs for pain at this time.  3.  Androgen deprivation: Last Eligard given on April 16 this will be repeated this week.  Complications  including hot flashes, weight gain and sexual dysfunction were reiterated.  Is agreeable to continue.  4.  Prognosis and goals of care: Therapy remains although aggressive measures are warranted given his reasonable performance status.   5.  Bone directed therapy: I recommended calcium and vitamin D supplements.  He has already obtained dental evaluation without any issues.  Risks and benefits of starting Xgeva given his metastatic prostate cancer to the bone were discussed.  Complications including hypocalcemia and osteonecrosis of the jaw were reiterated.  Plan to start this on August 20.   6.  Follow-up: He will return in the next few weeks to follow his progress.  30  minutes were spent on this encounter.  The time was dedicated to updating his disease status, discussing treatment options, reviewing imaging studies and addressing complications related to his cancer and cancer therapy.    Zola Button, MD 12/31/2019 7:42 AM

## 2019-12-31 NOTE — Patient Instructions (Signed)

## 2020-01-01 LAB — PROSTATE-SPECIFIC AG, SERUM (LABCORP): Prostate Specific Ag, Serum: 2.7 ng/mL (ref 0.0–4.0)

## 2020-01-07 ENCOUNTER — Ambulatory Visit (HOSPITAL_COMMUNITY)
Admission: RE | Admit: 2020-01-07 | Discharge: 2020-01-07 | Disposition: A | Discharge status: Home / Self Care | Payer: HMO | Source: Ambulatory Visit | Attending: Oncology | Admitting: Oncology

## 2020-01-07 DIAGNOSIS — M47814 Spondylosis without myelopathy or radiculopathy, thoracic region: Secondary | ICD-10-CM | POA: Diagnosis not present

## 2020-01-07 DIAGNOSIS — M40209 Unspecified kyphosis, site unspecified: Secondary | ICD-10-CM | POA: Diagnosis not present

## 2020-01-07 DIAGNOSIS — M549 Dorsalgia, unspecified: Secondary | ICD-10-CM

## 2020-01-07 DIAGNOSIS — M5126 Other intervertebral disc displacement, lumbar region: Secondary | ICD-10-CM | POA: Diagnosis not present

## 2020-01-07 DIAGNOSIS — C61 Malignant neoplasm of prostate: Secondary | ICD-10-CM | POA: Insufficient documentation

## 2020-01-07 DIAGNOSIS — G9589 Other specified diseases of spinal cord: Secondary | ICD-10-CM | POA: Diagnosis not present

## 2020-01-07 DIAGNOSIS — C7951 Secondary malignant neoplasm of bone: Secondary | ICD-10-CM | POA: Diagnosis not present

## 2020-01-07 MED ORDER — GADOBUTROL 1 MMOL/ML IV SOLN
10.0000 mL | Freq: Once | INTRAVENOUS | Status: AC | PRN
  Administered 2020-01-07: 10 mL via INTRAVENOUS

## 2020-01-08 ENCOUNTER — Other Ambulatory Visit: Payer: Self-pay | Admitting: Oncology

## 2020-01-08 ENCOUNTER — Other Ambulatory Visit: Payer: HMO

## 2020-01-08 NOTE — Progress Notes (Signed)
The results of the MRI were personally reviewed and discussed with Wesley White.  He certainly has measurable metastatic disease in the lower thoracic and upper lumbar spine which is consistent with his increased pain.  I recommended evaluation by Dr. Tammi Klippel for possible palliative radiation therapy to this area.  We will also discussed escalating his therapy and adding additional therapy in the form of Zytiga, Xtandi or systemic chemotherapy.  He has follow-up next week to discuss these options.  We will communicate these findings to Dr. Tammi Klippel as well.

## 2020-01-12 ENCOUNTER — Ambulatory Visit (INDEPENDENT_AMBULATORY_CARE_PROVIDER_SITE_OTHER): Payer: HMO | Admitting: Family Medicine

## 2020-01-12 ENCOUNTER — Other Ambulatory Visit: Payer: Self-pay

## 2020-01-12 ENCOUNTER — Encounter: Payer: Self-pay | Admitting: Family Medicine

## 2020-01-12 VITALS — BP 98/70 | HR 62 | Temp 97.3°F | Ht 67.0 in | Wt 212.6 lb

## 2020-01-12 DIAGNOSIS — Z0000 Encounter for general adult medical examination without abnormal findings: Secondary | ICD-10-CM | POA: Diagnosis not present

## 2020-01-12 DIAGNOSIS — Z9989 Dependence on other enabling machines and devices: Secondary | ICD-10-CM

## 2020-01-12 DIAGNOSIS — Z8546 Personal history of malignant neoplasm of prostate: Secondary | ICD-10-CM

## 2020-01-12 DIAGNOSIS — Z87442 Personal history of urinary calculi: Secondary | ICD-10-CM | POA: Diagnosis not present

## 2020-01-12 DIAGNOSIS — E1169 Type 2 diabetes mellitus with other specified complication: Secondary | ICD-10-CM

## 2020-01-12 DIAGNOSIS — M25552 Pain in left hip: Secondary | ICD-10-CM

## 2020-01-12 DIAGNOSIS — C61 Malignant neoplasm of prostate: Secondary | ICD-10-CM

## 2020-01-12 DIAGNOSIS — M79605 Pain in left leg: Secondary | ICD-10-CM

## 2020-01-12 DIAGNOSIS — G4733 Obstructive sleep apnea (adult) (pediatric): Secondary | ICD-10-CM

## 2020-01-12 DIAGNOSIS — N5231 Erectile dysfunction following radical prostatectomy: Secondary | ICD-10-CM | POA: Diagnosis not present

## 2020-01-12 DIAGNOSIS — E669 Obesity, unspecified: Secondary | ICD-10-CM

## 2020-01-12 DIAGNOSIS — C7951 Secondary malignant neoplasm of bone: Secondary | ICD-10-CM

## 2020-01-12 DIAGNOSIS — E785 Hyperlipidemia, unspecified: Secondary | ICD-10-CM | POA: Diagnosis not present

## 2020-01-12 DIAGNOSIS — E119 Type 2 diabetes mellitus without complications: Secondary | ICD-10-CM

## 2020-01-12 DIAGNOSIS — E66811 Obesity, class 1: Secondary | ICD-10-CM

## 2020-01-12 LAB — POCT GLYCOSYLATED HEMOGLOBIN (HGB A1C): Hemoglobin A1C: 7.1 % — AB (ref 4.0–5.6)

## 2020-01-12 LAB — LIPID PANEL
Chol/HDL Ratio: 2.8 ratio (ref 0.0–5.0)
Cholesterol, Total: 177 mg/dL (ref 100–199)
HDL: 64 mg/dL (ref >39)
LDL Chol Calc (NIH): 89 mg/dL (ref 0–99)
Triglycerides: 136 mg/dL (ref 0–149)
VLDL Cholesterol Cal: 24 mg/dL (ref 5–40)

## 2020-01-12 NOTE — Patient Instructions (Signed)
   Wesley White , Thank you for taking time to come for your Medicare Wellness Visit. I appreciate your ongoing commitment to your health goals. Please review the following plan we discussed and let me know if I can assist you in the future.   These are the goals we discussed: Goals    . Client understands the importance of follow-up with providers by attending scheduled visits     Primary MD visit completed 02/05/19 Oncology visit completed 05/06/19     . Client will verbalize knowledge of self management of Hypertension as evidences by BP reading of 140/90 or less; or as defined by provider     Assessed high blood pressure self management skills.  RN case manager advised client to contact his Health team advantage concierge for possible blood pressure monitor coverage Discussed/ reviewed blood pressure targets.    [name] HEMOGLOBIN A1C < 7.0     Discussed diabetes self management actions:  Glucose monitoring per provider recommendation  Check feet daily  Visit provider every 3-6 months as directed  Hbg A1C level every 3-6 months.  Eye Exam yearly   Carbohydrate controlled meal planning  Taking diabetes medication as prescribed by provider  Physical activity     . Maintain timely refills of diabetic medication as prescribed within the year .    [name] Obtain annual  Lipid Profile, LDL-C    . Obtain Annual Eye (retinal)  Exam     . Obtain Annual Foot Exam    . Obtain annual screen for micro albuminuria (urine) , nephropathy (kidney problems)    . Obtain Hemoglobin A1C at least 2 times per year    . Visit Primary Care Provider or Endocrinologist at least 2 times per year        This is a list of the screening recommended for you and due dates:  Health Maintenance  Topic Date Due  . Flu Shot  02/26/2020*  . Hemoglobin A1C  02/13/2020  . Eye exam for diabetics  02/17/2020  . Complete foot exam   01/11/2021  . Tetanus Vaccine  03/17/2022  . Colon Cancer Screening  04/17/2022  .  COVID-19 Vaccine  Completed  .  Hepatitis C: One time screening is recommended by Center for Disease Control  (CDC) for  adults born from [birth year] through 1965.   Completed  . Pneumonia vaccines  Completed  *Topic was postponed. The date shown is not the original due date.

## 2020-01-12 NOTE — Progress Notes (Signed)
Wesley White is a 69 y.o. male who presents for annual wellness visit,CPE and follow-up on chronic medical conditions.  He is being followed by radiation oncology as well as oncology.  He does have metastatic spread of his prostate cancer and has been having a lot of back and leg discomfort because of it.  Mainly on the left side.  He takes Aleve for this which does give him some relief.  He is also had some lower abdominal pain that is relieved when he empties his bladder.  He continues to do quite nicely on his CPAP and did bring a readout.  He does not check his blood sugars regularly.  He does check his feet periodically and does plan on having an eye exam in the near future.  He has some difficulty with ED and did try low-dose of Cialis.  He has not tried 20 mg dosing.  He does have a remote history of kidney stones but none recently.  He continues on rosuvastatin.  He is also taking Metformin and Invokana.  He is on low-dose lisinopril having no difficulty with any of these medications.  Psychologically he seems to be handling this quite nicely.  He has a very matter-of-fact attitude towards all of this.  Immunizations and Health Maintenance Immunization History  Administered Date(s) Administered  . Fluad Quad(high Dose 65+) 02/05/2019  . Influenza, High Dose Seasonal PF 04/25/2017, 06/20/2018  . Influenza,inj,Quad PF,6+ Mos 03/06/2016  . PFIZER SARS-COV-2 Vaccination 07/04/2019, 07/29/2019  . Pneumococcal Conjugate-13 02/05/2019  . Pneumococcal Polysaccharide-23 03/06/2016, 04/25/2017  . Tdap 03/17/2012  . Zoster 03/17/2012   Health Maintenance Due  Topic Date Due  . FOOT EXAM  Never done    Last colonoscopy: 04/17/12 Last PSA: 12/31/19 Dentist: Q six months  Ophtho: q two years Exercise: Walking several times per week.  Other doctors caring for patient include: Dr. Hilarie Fredrickson GI, [name] oncology  Advanced Directives: Does Patient Have a Medical Advance Directive?: No Copy of  [name] in Chart?: No - copy requested Would patient like information on creating a medical advance directive?: No - Patient declined  Depression screen:  See questionnaire below.     Depression screen [facility] 2/9 01/12/2020 06/15/2019 11/20/2018 07/04/2017 11/21/2016  Decreased Interest 0 0 0 0 0  Down, Depressed, Hopeless 0 0 0 0 0  PHQ - 2 Score 0 0 0 0 0    Fall Screen: See Questionaire below.   Fall Risk  01/12/2020 11/20/2018 07/04/2017 11/21/2016 02/01/2016  Falls in the past year? 0 0 No No No  Risk for fall due to : No Fall Risks - - - -    ADL screen:  See questionnaire below.  Functional Status Survey: Is the patient deaf or have difficulty hearing?: No Does the patient have difficulty seeing, even when wearing glasses/contacts?: No Does the patient have difficulty concentrating, remembering, or making decisions?: No Does the patient have difficulty walking or climbing stairs?: No Does the patient have difficulty dressing or bathing?: No Does the patient have difficulty doing errands alone such as visiting a doctor's office or shopping?: No   Review of Systems  Constitutional: -, -unexpected weight change, -anorexia, -fatigue Allergy: -sneezing, -itching, -congestion Dermatology: denies changing moles, rash, lumps ENT: -runny nose, -ear pain, -sore throat,  Cardiology:  -chest pain, -palpitations, -orthopnea, Respiratory: -cough, -shortness of breath, -dyspnea on exertion, -wheezing,  Gastroenterology: -abdominal pain, -nausea, -vomiting, -diarrhea, -constipation, -dysphagia Hematology: -bleeding or bruising problems Musculoskeletal: -arthralgias, -myalgias, -joint swelling, -  back pain, - Ophthalmology: -vision changes,  Urology: -dysuria, -difficulty urinating,  -urinary frequency, -urgency, incontinence Neurology: -, -numbness, , -memory loss, -falls, -dizziness    PHYSICAL EXAM:  General Appearance: Alert, cooperative, no distress, appears stated  age Head: Normocephalic, without obvious abnormality, atraumatic Eyes: PERRL, conjunctiva/corneas clear, EOM's intact,  Ears: Normal TM's and external ear canals Nose: Nares normal, mucosa normal, no drainage or sinus   tenderness Throat: Lips, mucosa, and tongue normal; teeth and gums normal Neck: Supple, no lymphadenopathy, thyroid:no enlargement/tenderness/nodules; no carotid bruit or JVD Lungs: Clear to auscultation bilaterally without wheezes, rales or ronchi; respirations unlabored Heart: Regular rate and rhythm, S1 and S2 normal, no murmur, rub or gallop Extremities: No clubbing, cyanosis or edema Pulses: 2+ and symmetric all extremities Skin: Skin color, texture, turgor normal, no rashes or lesions Lymph nodes: Cervical, supraclavicular, and axillary nodes normal Neurologic: CNII-XII intact, normal strength, sensation and gait; reflexes 2+ and symmetric throughout   Psych: Normal mood, affect, hygiene and grooming Hemoglobin A1c is 7.1 ASSESSMENT/PLAN: Routine general medical examination at a health care facility  Obesity (BMI 30.0-34.9)  OSA on CPAP  History of kidney stones  History of prostate cancer  Left leg pain  Left hip pain  Malignant neoplasm of prostate metastatic to bone [facility])  Erectile dysfunction after radical prostatectomy  Hyperlipidemia associated with type 2 diabetes mellitus ([place]) - Plan: Lipid panel  Controlled type 2 diabetes mellitus without complication, without long-term current use of insulin (HCC) - Plan: POCT glycosylated hemoglobin (Hb A1C), VAS [place] ABI WITH/WO TBI He will continue to be followed by oncology and radiation oncology.  He has a good handle on how to take care of his discomfort.  He will continue on his present medications.  Did recommend that he take his blood sugars more regularly.  Also discussed relative dehydration later on in the evening to try and minimize need to get up in the middle night to urinate since it does cause  some discomfort.  Recommend he take 20 mg of Cialis to see if that will help with his erectile dysfunction. Also encouraged him to become more physically active to help with his weight. recommended at least 30 minutes of aerobic activity at least 5 days/week;; healthy diet and alcohol recommendations (less than or equal to 2 drinks/day)  Immunization recommendations discussed.  Recommend getting Shingrix and also Covid booster.  Colonoscopy recommendations reviewed.   Medicare Attestation I have personally reviewed: The patient's medical and social history Their use of alcohol, tobacco or illicit drugs Their current medications and supplements The patient's functional ability including ADLs,fall risks, home safety risks, cognitive, and hearing and visual impairment Diet and physical activities Evidence for depression or mood disorders  The patient's weight, height, and BMI have been recorded in the chart.  I have made referrals, counseling, and provided education to the patient based on review of the above and I have provided the patient with a written personalized care plan for preventive services.     Jill Alexanders, MD   01/12/2020

## 2020-01-13 ENCOUNTER — Telehealth: Payer: Self-pay

## 2020-01-13 NOTE — Telephone Encounter (Signed)
Mailed diet info for pt per [employer]. [place]

## 2020-01-13 NOTE — Telephone Encounter (Signed)
Error

## 2020-01-15 ENCOUNTER — Encounter: Payer: Self-pay | Admitting: Urology

## 2020-01-15 ENCOUNTER — Telehealth: Payer: Self-pay | Admitting: Pharmacist

## 2020-01-15 ENCOUNTER — Other Ambulatory Visit: Payer: Self-pay

## 2020-01-15 ENCOUNTER — Ambulatory Visit
Admission: RE | Admit: 2020-01-15 | Discharge: 2020-01-15 | Disposition: A | Discharge status: Home / Self Care | Payer: HMO | Source: Ambulatory Visit | Attending: Radiation Oncology | Admitting: Radiation Oncology

## 2020-01-15 ENCOUNTER — Inpatient Hospital Stay: Payer: HMO | Admitting: Oncology

## 2020-01-15 ENCOUNTER — Inpatient Hospital Stay: Payer: HMO

## 2020-01-15 ENCOUNTER — Ambulatory Visit
Admission: RE | Admit: 2020-01-15 | Discharge: 2020-01-15 | Disposition: A | Discharge status: Home / Self Care | Payer: HMO | Source: Ambulatory Visit | Attending: Urology | Admitting: Urology

## 2020-01-15 ENCOUNTER — Telehealth: Payer: Self-pay

## 2020-01-15 VITALS — BP 119/65 | HR 72 | Temp 98.4°F | Resp 17 | Ht 67.0 in | Wt 215.2 lb

## 2020-01-15 VITALS — BP 108/71 | HR 68 | Temp 98.3°F | Resp 18 | Ht 67.0 in | Wt 214.1 lb

## 2020-01-15 DIAGNOSIS — Z8042 Family history of malignant neoplasm of prostate: Secondary | ICD-10-CM | POA: Insufficient documentation

## 2020-01-15 DIAGNOSIS — C7951 Secondary malignant neoplasm of bone: Secondary | ICD-10-CM | POA: Diagnosis not present

## 2020-01-15 DIAGNOSIS — C61 Malignant neoplasm of prostate: Secondary | ICD-10-CM | POA: Insufficient documentation

## 2020-01-15 DIAGNOSIS — Z85830 Personal history of malignant neoplasm of bone: Secondary | ICD-10-CM | POA: Diagnosis not present

## 2020-01-15 DIAGNOSIS — Z8546 Personal history of malignant neoplasm of prostate: Secondary | ICD-10-CM | POA: Diagnosis not present

## 2020-01-15 DIAGNOSIS — Z79899 Other long term (current) drug therapy: Secondary | ICD-10-CM | POA: Insufficient documentation

## 2020-01-15 DIAGNOSIS — Z510 Encounter for antineoplastic radiation therapy: Secondary | ICD-10-CM | POA: Diagnosis not present

## 2020-01-15 DIAGNOSIS — Z923 Personal history of irradiation: Secondary | ICD-10-CM | POA: Insufficient documentation

## 2020-01-15 DIAGNOSIS — G4730 Sleep apnea, unspecified: Secondary | ICD-10-CM | POA: Insufficient documentation

## 2020-01-15 DIAGNOSIS — Z9079 Acquired absence of other genital organ(s): Secondary | ICD-10-CM | POA: Diagnosis not present

## 2020-01-15 DIAGNOSIS — Z7984 Long term (current) use of oral hypoglycemic drugs: Secondary | ICD-10-CM | POA: Diagnosis not present

## 2020-01-15 DIAGNOSIS — G893 Neoplasm related pain (acute) (chronic): Secondary | ICD-10-CM | POA: Diagnosis not present

## 2020-01-15 DIAGNOSIS — Z806 Family history of leukemia: Secondary | ICD-10-CM | POA: Diagnosis not present

## 2020-01-15 DIAGNOSIS — R9721 Rising PSA following treatment for malignant neoplasm of prostate: Secondary | ICD-10-CM | POA: Diagnosis not present

## 2020-01-15 MED ORDER — DENOSUMAB 120 MG/1.7ML ~~LOC~~ SOLN
SUBCUTANEOUS | Status: AC
  Filled 2020-01-15: qty 1.7

## 2020-01-15 MED ORDER — LEUPROLIDE ACETATE (4 MONTH) 30 MG ~~LOC~~ KIT
PACK | SUBCUTANEOUS | Status: AC
  Filled 2020-01-15: qty 30

## 2020-01-15 MED ORDER — DENOSUMAB 120 MG/1.7ML ~~LOC~~ SOLN
120.0000 mg | Freq: Once | SUBCUTANEOUS | Status: AC
  Administered 2020-01-15: 120 mg via SUBCUTANEOUS

## 2020-01-15 MED ORDER — ENZALUTAMIDE 40 MG PO TABS: 160.0000 mg | ORAL_TABLET | Freq: Every day | ORAL | 0 refills | Status: DC

## 2020-01-15 MED ORDER — LEUPROLIDE ACETATE (4 MONTH) 30 MG ~~LOC~~ KIT: 30.0000 mg | PACK | Freq: Once | SUBCUTANEOUS | Status: DC

## 2020-01-15 NOTE — Telephone Encounter (Signed)
Oral Oncology Patient Advocate Encounter  Prior Authorization for Gillermina Phy has been approved.    PA# [id number] Effective dates: 01/15/20 through 01/14/21  Patients co-pay is $2156.85  Oral Oncology Clinic will continue to follow.   [place] Patient [place] Phone [phone] Fax [phone] 01/15/2020 4:23 PM

## 2020-01-15 NOTE — Telephone Encounter (Signed)
Oral Oncology Pharmacist Encounter  Received new prescription for Xtandi (enzalutamide) for the treatment of advanced castration-resistant prostate cancer in conjunction with Eligard, planned duration until disease progression or unacceptable drug toxicity.  Prescription dose and frequency assessed for appropriateness. OK for therapy initiation.   CBC w/ Diff and CMP from 12/31/19 assessed, noted alk phos elevated (260 U/L) most likely secondary to bone involvement. No dose adjustments required.  Current medication list in Epic reviewed, DDIs with Xtandi identified:  Category D DDI between [place] and Hydrocodone/Acetaminophen - Xtandi, a CYP3A4 inducer can increase the metabolism of CYP3A4 substrates, such as hydrocodone/acetaminophen, possibly decreasing the efficacy of hydrocodone/acetaminophen. Will educate patient on monitoring for breakthrough pain.   Category D DDI between [place] and Cialis- Xtandi, a CYP3A4 inducer can increase the metabolism of CYP3A4 substrates, such as Cialis, possibly decreasing the efficacy of Cialis. Will counsel patient on interaction.  No dose adjustments for Xtandi required.   Evaluated chart and no patient barriers to medication adherence noted.   Prescription has been e-scribed to the [facility] for benefits analysis and approval.  Oral Oncology Clinic will continue to follow for insurance authorization, copayment issues, initial counseling and start date.  Leron Croak, PharmD, BCPS Hematology/Oncology Clinical Pharmacist [facility] [phone] 01/15/2020 12:59 PM

## 2020-01-15 NOTE — Progress Notes (Signed)
  Radiation Oncology         [phone]) [phone] ________________________________  Name: Radin Raptis MRN: [id number]  Date: 01/15/2020  DOB: [birth year]  SIMULATION AND TREATMENT PLANNING NOTE    ICD-10-CM   1. Malignant neoplasm of prostate metastatic to bone ([place])  C61    C79.51     DIAGNOSIS:  69 y.o. 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  Identity was confirmed.  All relevant records and images related to the planned course of therapy were reviewed.  The patient freely provided informed written consent to proceed with treatment after reviewing the details related to the planned course of therapy. The consent form was witnessed and verified by the simulation staff.  Then, the patient was set-up in a stable reproducible  supine position for radiation therapy.  CT images were obtained.  Surface markings were placed.  The CT images were loaded into the planning software.  Then the target and avoidance structures were contoured including kidneys.  Treatment planning then occurred.  The radiation prescription was entered and confirmed.  Then, I designed and supervised the construction of a total of 3 medically necessary complex treatment devices with VacLoc positioner and 2 MLCs to shield kidneys.  I have requested : 3D Simulation  I have requested a DVH of the following structures: Left Kidney, Right Kidney and target.  PLAN:  The patient will receive 30 Gy in 10 fractions to T11 to L4, inclusive.  ________________________________  Sheral Apley. Tammi Klippel, M.D.

## 2020-01-15 NOTE — Telephone Encounter (Signed)
Oral Oncology Patient Advocate Encounter  Received notification from [facility] that prior authorization for Gillermina Phy is required.  PA submitted on CoverMyMeds Key [id number] Status is pending  Oral Oncology Clinic will continue to follow.  [place] Patient [place] Phone [phone] Fax [phone] 01/15/2020 1:14 PM

## 2020-01-15 NOTE — Progress Notes (Signed)
Radiation Oncology         [phone] ________________________________  Outpatient Re-Consultation  Name: Wesley White MRN: [id number]  Date of Service: 01/15/2020 DOB: 23-Dec-1950  NG:[id number], [name], MD  [name], MD   REFERRING PHYSICIAN: [name], MD  DIAGNOSIS: The encounter diagnosis was Malignant neoplasm of prostate metastatic to bone [facility]).    ICD-10-CM   1. Malignant neoplasm of prostate metastatic to bone [facility])  C61    C79.51     HISTORY OF PRESENT ILLNESS: Wesley White is a 69 y.o. male seen at the request of [name]. He was initially diagnosed with prostate cancer in July 2017 and seen for consultation in our clinic on 02/01/2016.  He ultimately decided to proceed with prostatectomy.  Dr. Alinda Money performed a UNS RALP with BPLND on 03/05/2016  and final pathology revealed pT3bN0, Gleason 4+3 with tertiary pattern 5, adenocarcinoma involving both prostate lobes with extraprostatic extension, left seminal vesicle involvement, positive margin at the bladder neck, LVI and PNI. There was no evidence of carcinoma in 9 of 9 lymph nodes.His initial postop PSA was detectable at 0.016 and he proceeded with adjuvant prostate fossa IMRT from 08/08/2016 to 09/28/2016. He tolerated radiation treatment relatively well.Post-treatment PSA was 0.048 in 12/2016.   He continued with ongoing PSA surveillance and was noted to have biochemical recurrence in February 2020 with a PSA of 0.51. Repeat PSA in May 2020 increased rapidly to 3.91. At that time, the patient mentioned new left-sided hip pain that radiated down the left lower extremity. He was evaluated with CT scan of the abdomen/pelvis on 10/30/2018 which identified a new large, non-expansile sclerotic lesion in the medial left iliac bone but without  lymphadenopathy or other findings suspicious for metastatic disease. Bone scan from 11/07/2018 showed radiotracer accumulation corresponding to the sclerotic lesion in the medial aspect of  the left iliac bone, most consistent with solitary skeletal metastasis. He was started on firmagon on 11/20/2018, and he continues on Eligard ADT every 4 months. His case was presented in the multidisciplinary prostate conference with consensus recommendation to proceed with targeted SBRT to the left iliac lesion which he agreed with and completed in 11/2018.  He was started on Zytiga in 01/15/2019 but this was discontinued due to lack of insurance coverage.    His most recent disease restaging scans were performed on 09/07/2019. CT A/P revealed a new foci of sclerosis in T12, L2, and S1 and a  minimally enlarged left external iliac lymph node, as compared to 10/2018. Bone scan showed new focal uptake over the right side of L3, corresponding to sclerosis over right L3 facet, unchanged faint focal uptake over right posterior 7th rib with subtle diffuse uptake over sternum and patchy uptake over anterior ribs. Given his lack of severe symptoms, he elected to continue with ADT alone at that time. His most recent Eligard was received at his follow-up visit with [name] today. His PSA responded appropriately and became undetectable until his most recent labs on 12/31/19 which showed his PSA increased to 2.7. 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   At the time of his follow up visit with [name] on 12/31/2019, he presented with a new complaint of intermittent and nagging pain in his middle and lower back, radiating into the right flank/hip.  Given his recent PSA elevation and new complaints of pain, a lumbar and thoracic spine MRI was performed on 01/07/2020 which  showed bony metastases involving the thoracic and lumbar spine with the largest foci of metastatic disease present at T12 in the left posterior body, pedicle and posterior elements (no pathologic fracture or sign of extraosseous tumor) as well as L2 in the right inferior articular process and  spinous process, L3 at the right posterior vertebral body, pedicle and posterior elements and spinous process, and multiple lesions within the left iliac bone.  Smaller foci were noted at T5, L1, L4, L5-S1 without evidence of extraosseous tumor or cord compression.  He has been kindly referred back to [place] today to discuss potential treatment to the involved areas in the thoracic and lumbar spine.  PREVIOUS RADIATION THERAPY: Yes  12/09/2018 - 12/19/2018: The isolated oligometastasis (left iliac lesion) was treated to 40 Gy in 5 fractions of 8 Gy (SBRT)  08/08/2016 - 09/28/2016:The prostatic fossa was treated to 68.4 Gy in 38 fractions of 1.8 Gy  PAST MEDICAL HISTORY:  Past Medical History:  Diagnosis Date  . Chronic kidney disease    hx kidney stone year ago  . Prediabetes   . Prostate cancer ([place])   . Sleep apnea       PAST SURGICAL HISTORY: Past Surgical History:  Procedure Laterality Date  . HERNIA REPAIR     1983 bal[place]   . LITHOTRIPSY     2011 by tannenbaum  . LYMPHADENECTOMY Bilateral 03/05/2016   Procedure: PELVIC LYMPHADENECTOMY;  Surgeon: Raynelle Bring, MD;  Location: [facility];  Service: Urology;  Laterality: Bilateral;  . PROSTATE BIOPSY    . ROBOT ASSISTED LAPAROSCOPIC RADICAL PROSTATECTOMY N/A 03/05/2016   Procedure: XI ROBOTIC ASSISTED LAPAROSCOPIC RADICAL PROSTATECTOMY LEVEL 2;  Surgeon: Raynelle Bring, MD;  Location: [facility];  Service: Urology;  Laterality: N/A;    FAMILY HISTORY:  Family History  Problem Relation Age of Onset  . Cancer Father 56       Lymphoma  . Cancer Brother 33       Prostate  . Diabetes Mother   . Hyperlipidemia Mother   . Hypertension Mother   . Bipolar disorder Daughter   . Colon cancer Neg Hx   . Esophageal cancer Neg Hx   . Rectal cancer Neg Hx   . Stomach cancer Neg Hx     SOCIAL HISTORY:  Social History   Socioeconomic History  . Marital status: Married    Spouse name: Not on file  . Number of children: Not on file  . Years of  education: Not on file  . Highest education level: Not on file  Occupational History  . Not on file  Tobacco Use  . Smoking status: Never Smoker  . Smokeless tobacco: Never Used  Substance and Sexual Activity  . Alcohol use: Yes    Comment: rare  . Drug use: No  . Sexual activity: Yes  Other Topics Concern  . Not on file  Social History Narrative  . Not on file   Social Determinants of Health   Financial Resource Strain:   . Difficulty of Paying Living Expenses: Not on file  Food Insecurity: No Food Insecurity  . Worried About Charity fundraiser in the Last Year: Never true  . Ran Out of Food in the Last Year: Never true  Transportation Needs: No Transportation Needs  . Lack of Transportation (Medical): No  . Lack of Transportation (Non-Medical): No  Physical Activity:   . Days of Exercise per Week: Not on file  . Minutes of Exercise per Session: Not on file  Stress:   . Feeling of Stress : Not on file  Social Connections:   . Frequency of Communication with Friends and Family: Not on file  . Frequency of Social Gatherings with Friends and Family: Not on file  . Attends Religious Services: Not on file  . Active Member of Clubs or Organizations: Not on file  . Attends Archivist Meetings: Not on file  . Marital Status: Not on file  Intimate Partner Violence:   . Fear of Current or Ex-Partner: Not on file  . Emotionally Abused: Not on file  . Physically Abused: Not on file  . Sexually Abused: Not on file    ALLERGIES: Patient has no known allergies.  MEDICATIONS:  Current Outpatient Medications  Medication Sig Dispense Refill  . acetaminophen (TYLENOL) 325 MG tablet Take 650 mg by mouth every 6 (six) hours as needed for mild pain.    . enzalutamide (XTANDI) 40 MG tablet Take 4 tablets (160 mg total) by mouth daily. 120 tablet 0  . glucose blood test strip 1 each by Other route as needed. Use as instructed 100 each 11  . ibuprofen (ADVIL) 600 MG tablet  Take 600 mg by mouth every 6 (six) hours as needed.    . INVOKANA 300 MG TABS tablet TAKE 1 TABLET BY MOUTH EVERY DAY BEFORE BREAKFAST 90 tablet 1  . lisinopril (ZESTRIL) 5 MG tablet TAKE 1 TABLET BY MOUTH EVERY DAY 90 tablet 3  . metFORMIN (GLUCOPHAGE) 500 MG tablet TAKE 1 TABLET BY MOUTH TWICE A DAY WITH A MEAL 180 tablet 1  . naproxen sodium (ALEVE) 220 MG tablet Take 220 mg by mouth daily as needed.     [name] Delica Lancets 53G MISC See admin instructions.    . rosuvastatin (CRESTOR) 20 MG tablet TAKE 1 TABLET BY MOUTH EVERY DAY 90 tablet 3  . CIALIS 5 MG tablet Take 5 mg by mouth daily. (Patient not taking: Reported on 01/12/2020)  11  . HYDROcodone-acetaminophen (NORCO) 5-325 MG tablet Take 1 tablet by mouth every 6 (six) hours as needed for moderate pain. (Patient not taking: Reported on 10/24/2018) 15 tablet 0   No current facility-administered medications for this encounter.   Facility-Administered Medications Ordered in Other Encounters  Medication Dose Route Frequency Provider Last Rate Last Admin  . magnesium citrate solution 1 Bottle  1 Bottle Oral Once Raynelle Bring, MD      . sodium phosphate (FLEET) 7-19 GM/118ML enema 1 enema  1 enema Rectal Once Raynelle Bring, MD        REVIEW OF SYSTEMS:  On review of systems, the patient reports that he is doing well overall. He denies any chest pain, shortness of breath, cough, fevers, chills, night sweats, unintended weight changes. He denies any bowel or bladder disturbances, and denies abdominal pain, nausea or vomiting.  He continues with progressive pain in the mid to low back, radiating into the right flank and lateral hip with occasional radiation into the lower extremity.  He does have intermittent pain into the left flank and left lower extremity as well, less bothersome than that on the right.  He denies numbness or tingling or weakness in the upper or lower extremities.  A complete review of systems is obtained and is otherwise  negative.    PHYSICAL EXAM:  Wt Readings from Last 3 Encounters:  01/15/20 214 lb 2 oz (97.1 kg)  01/15/20 215 lb 3.2 oz (97.6 kg)  01/12/20 212 lb 9.6 oz (96.4 kg)  Temp Readings from Last 3 Encounters:  01/15/20 98.3 F (36.8 C) (Oral)  01/15/20 98.4 F (36.9 C) (Tympanic)  01/12/20 (!) 97.3 F (36.3 C)   BP Readings from Last 3 Encounters:  01/15/20 108/71  01/15/20 119/65  01/12/20 98/70   Pulse Readings from Last 3 Encounters:  01/15/20 68  01/15/20 72  01/12/20 62   Pain Assessment Pain Score: 2  Pain Loc: Back (lower)/10  In general this is a well appearing Caucasian male in no acute distress. He is alert and oriented x4 and appropriate throughout the examination. HEENT reveals that the patient is normocephalic, atraumatic. EOMs are intact. PERRLA. Skin is intact without any evidence of gross lesions. Cardiovascular exam reveals a regular rate and rhythm, no clicks rubs or murmurs are auscultated. Cardiopulmonary assessment is negative for acute distress and he exhibits normal effort. The abdomen is soft, non tender, non distended. Lower extremities are negative for pretibial pitting edema, deep calf tenderness, cyanosis or clubbing.   KPS = 80  100 - Normal; no complaints; no evidence of disease. 90   - Able to carry on normal activity; minor signs or symptoms of disease. 80   - Normal activity with effort; some signs or symptoms of disease. 71   - Cares for self; unable to carry on normal activity or to do active work. 60   - Requires occasional assistance, but is able to care for most of his personal needs. 50   - Requires considerable assistance and frequent medical care. 51   - Disabled; requires special care and assistance. 54   - Severely disabled; hospital admission is indicated although death not imminent. 35   - Very sick; hospital admission necessary; active supportive treatment necessary. 10   - Moribund; fatal processes progressing rapidly. 0     -  Dead  Karnofsky DA, Abelmann [place], Craver LS and Burchenal JH [phone]) The use of the nitrogen mustards in the palliative treatment of carcinoma: with particular reference to bronchogenic carcinoma Cancer 1 634-56  LABORATORY DATA:  Lab Results  Component Value Date   WBC 4.8 12/31/2019   HGB 14.7 12/31/2019   HCT 43.9 12/31/2019   MCV 92.2 12/31/2019   PLT 226 12/31/2019   Lab Results  Component Value Date   NA 138 12/31/2019   K 4.8 12/31/2019   CL 103 12/31/2019   CO2 26 12/31/2019   Lab Results  Component Value Date   ALT 27 12/31/2019   AST 24 12/31/2019   ALKPHOS 260 (H) 12/31/2019   BILITOT 0.6 12/31/2019     RADIOGRAPHY: MR THORACIC SPINE W WO CONTRAST  Result Date: 01/08/2020 CLINICAL DATA:  Metastatic prostate cancer. EXAM: MRI THORACIC WITHOUT AND WITH CONTRAST TECHNIQUE: Multiplanar and multiecho pulse sequences of the thoracic spine were obtained without and with intravenous contrast. CONTRAST:  22mL GADAVIST GADOBUTROL 1 MMOL/ML IV SOLN COMPARISON:  Bone scan 11/07/2018.  Lumbar MRI same day. FINDINGS: MRI THORACIC SPINE FINDINGS Alignment:  Slightly increased kyphotic curvature. Vertebrae: Abnormal signal of the anterior inferior corner of the T1 vertebral body that could be discogenic or secondary to metastatic disease. Subcentimeter focus of marrow signal at inferior T5 likely to represent a small metastasis. Tumor within the left posterior T12 vertebral body extending into the left pedicle and posterior elements consistent with metastatic disease. No other disease evident in the thoracic spine or posterior ribs. Cord:  No cord compression or primary cord lesion. Paraspinal and other soft tissues: Known cyst within the posterior aspect  of the right lobe of the liver. Disc levels: No significant thoracic degenerative disc disease. No disc herniation or compressive narrowing of the canal or foramina. Ordinary mild facet osteoarthritis. IMPRESSION: 1. Tumor within the left  posterior T12 vertebral body extending into the left pedicle and posterior elements. No pathologic fracture. No sign of extraosseous tumor. 2. Abnormal signal of the anterior inferior corner of the T1 vertebral body that could be discogenic or secondary to metastatic disease. 3. Small focus of marrow signal at the inferior T5 vertebral body likely to represent a small metastasis. Electronically Signed   By: Nelson Chimes M.D.   On: 01/08/2020 11:05   MR LUMBAR SPINE W WO CONTRAST  Result Date: 01/08/2020 CLINICAL DATA:  Prostate cancer.  Assess treatment response. EXAM: MRI LUMBAR SPINE WITHOUT AND WITH CONTRAST TECHNIQUE: Multiplanar and multiecho pulse sequences of the lumbar spine were obtained without and with intravenous contrast. CONTRAST:  3mL GADAVIST GADOBUTROL 1 MMOL/ML IV SOLN COMPARISON:  10/17/2018 radiography.  Bone scan 09/07/2019 FINDINGS: Segmentation:  5 lumbar type vertebral bodies. Alignment:  Normal Vertebrae: Tumor within the left pedicle and posterior elements at T12. At L1, there is a small tumor deposit at the junction of the pedicle and transverse process. At L2, there is tumor within the inferior articular process on the right. There is also tumor within the spinous process. At L3, there is tumor within the right posterior corner of the vertebral body, the right pedicle and throughout the posterior elements including the spinous process. At L4, there is a small marrow focus in the posterior right vertebral body. At L5, there is a 9 mm focus within the posterior central vertebral body. S1 shows 8 marrow focus in the posterior central to right-sided vertebral body. Coronal large field-of-view imaging shows multiple lesions of the left iliac bone. Conus medullaris and cauda equina: Conus extends to the L1 level. Conus and cauda equina appear normal. Paraspinal and other soft tissues: Edema and enhancement of the iliacus muscle on the left related to the adjacent bone disease. Disc levels:  No significant disc pathology. Minimal non-compressive disc bulges. No canal or foraminal stenosis because of degenerative disease. No extraosseous tumor resulting in nerve compression. IMPRESSION: Largest foci of metastatic disease present at T12 in the left posterior body, pedicle and posterior elements, L2 in the right inferior articular process and spinous process, L3 at the right posterior vertebral body, pedicle and posterior elements and spinous process, and within the left iliac bone. Small foci as outlined above at L1, L4, L5-S1. No sign of extraosseous tumor or neural compression because of tumor. Electronically Signed   By: Nelson Chimes M.D.   On: 01/08/2020 11:00      IMPRESSION/PLAN: 1. 69 y.o. 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  Today, we talked to the patient and his wife about the findings and workup thus far. We reviewed the natural history of  metastatic prostate carcinoma and general treatment, highlighting the role of palliative radiation therapy in the management of painful sites of disease.  We personally reviewed the MRI images with him and his wife and we discussed the available radiation techniques, and focused on the details and logistics of delivery.  The recommendation is for a 2-week course of daily palliative radiotherapy to the painful sites of disease in T12 as well as L2-L3.  We reviewed the anticipated acute and late sequelae associated with radiation in this setting. The patient was  encouraged to ask questions that were answered to his satisfaction.  At the end of our conversation, the patient is interested in moving forward with the recommended 2-week course of daily palliative radiotherapy.  He has freely signed written consent to proceed today in the office and a copy of this document will be placed in his medical record.  He is scheduled for CT simulation/treatment planning following our  visit today, in anticipation of beginning his treatment early next week so that he can pleat complete his treatment prior to leaving for a scheduled vacation on January 29, 2020. We will share our discussion with [name] and move forward with treatment planning accordingly.  We enjoyed meeting with him and his wife today and look forward to continuing to participate in his care.   Nicholos Johns, PA-C    Tyler Pita, MD  [facility] Direct Dial: [phone]  Fax: [phone] [email or web address]  Skype  [email or web address]   This document serves as a record of services personally performed by Tyler Pita, MD and Freeman Caldron, PA-C. It was created on their behalf by Wilburn Mylar, a trained medical scribe. The creation of this record is based on the scribe's personal observations and the provider's statements to them. This document has been checked and approved by the attending provider.

## 2020-01-15 NOTE — Patient Instructions (Signed)
Denosumab injection What is this medicine? DENOSUMAB (den oh sue mab) slows bone breakdown. Prolia is used to treat osteoporosis in women after menopause and in men, and in people who are taking corticosteroids for 6 months or more. Xgeva is used to treat a high calcium level due to cancer and to prevent bone fractures and other bone problems caused by multiple myeloma or cancer bone metastases. Xgeva is also used to treat giant cell tumor of the bone. This medicine may be used for other purposes; ask your health care provider or pharmacist if you have questions. COMMON BRAND NAME(S): Prolia, XGEVA What should I tell my health care provider before I take this medicine? They need to know if you have any of these conditions:  dental disease  having surgery or tooth extraction  infection  kidney disease  low levels of calcium or Vitamin D in the blood  malnutrition  on hemodialysis  skin conditions or sensitivity  thyroid or parathyroid disease  an unusual reaction to denosumab, other medicines, foods, dyes, or preservatives  pregnant or trying to get pregnant  breast-feeding How should I use this medicine? This medicine is for injection under the skin. It is given by a health care professional in a hospital or clinic setting. A special MedGuide will be given to you before each treatment. Be sure to read this information carefully each time. For Prolia, talk to your pediatrician regarding the use of this medicine in children. Special care may be needed. For Xgeva, talk to your pediatrician regarding the use of this medicine in children. While this drug may be prescribed for children as young as 13 years for selected conditions, precautions do apply. Overdosage: If you think you have taken too much of this medicine contact a poison control center or emergency room at once. NOTE: This medicine is only for you. Do not share this medicine with others. What if I miss a dose? It is  important not to miss your dose. Call your doctor or health care professional if you are unable to keep an appointment. What may interact with this medicine? Do not take this medicine with any of the following medications:  other medicines containing denosumab This medicine may also interact with the following medications:  medicines that lower your chance of fighting infection  steroid medicines like prednisone or cortisone This list may not describe all possible interactions. Give your health care provider a list of all the medicines, herbs, non-prescription drugs, or dietary supplements you use. Also tell them if you smoke, drink alcohol, or use illegal drugs. Some items may interact with your medicine. What should I watch for while using this medicine? Visit your doctor or health care professional for regular checks on your progress. Your doctor or health care professional may order blood tests and other tests to see how you are doing. Call your doctor or health care professional for advice if you get a fever, chills or sore throat, or other symptoms of a cold or flu. Do not treat yourself. This drug may decrease your body's ability to fight infection. Try to avoid being around people who are sick. You should make sure you get enough calcium and vitamin D while you are taking this medicine, unless your doctor tells you not to. Discuss the foods you eat and the vitamins you take with your health care professional. See your dentist regularly. Brush and floss your teeth as directed. Before you have any dental work done, tell your dentist you are   receiving this medicine. Do not become pregnant while taking this medicine or for 5 months after stopping it. Talk with your doctor or health care professional about your birth control options while taking this medicine. Women should inform their doctor if they wish to become pregnant or think they might be pregnant. There is a potential for serious side  effects to an unborn child. Talk to your health care professional or pharmacist for more information. What side effects may I notice from receiving this medicine? Side effects that you should report to your doctor or health care professional as soon as possible:  allergic reactions like skin rash, itching or hives, swelling of the face, lips, or tongue  bone pain  breathing problems  dizziness  jaw pain, especially after dental work  redness, blistering, peeling of the skin  signs and symptoms of infection like fever or chills; cough; sore throat; pain or trouble passing urine  signs of low calcium like fast heartbeat, muscle cramps or muscle pain; pain, tingling, numbness in the hands or feet; seizures  unusual bleeding or bruising  unusually weak or tired Side effects that usually do not require medical attention (report to your doctor or health care professional if they continue or are bothersome):  constipation  diarrhea  headache  joint pain  loss of appetite  muscle pain  runny nose  tiredness  upset stomach This list may not describe all possible side effects. Call your doctor for medical advice about side effects. You may report side effects to FDA at 1-800-FDA-1088. Where should I keep my medicine? This medicine is only given in a clinic, doctor's office, or other health care setting and will not be stored at home. NOTE: This sheet is a summary. It may not cover all possible information. If you have questions about this medicine, talk to your doctor, pharmacist, or health care provider.  2020 Elsevier/Gold Standard (2017-09-20 16:10:44) Leuprolide depot injection What is this medicine? LEUPROLIDE (loo PROE lide) is a man-made protein that acts like a natural hormone in the body. It decreases testosterone in men and decreases estrogen in women. In men, this medicine is used to treat advanced prostate cancer. In women, some forms of this medicine may be used  to treat endometriosis, uterine fibroids, or other male hormone-related problems. This medicine may be used for other purposes; ask your health care provider or pharmacist if you have questions. COMMON BRAND NAME(S): Eligard, Fensolv, Lupron Depot, Lupron Depot-Ped, Viadur What should I tell my health care provider before I take this medicine? They need to know if you have any of these conditions:  diabetes  heart disease or previous heart attack  high blood pressure  high cholesterol  mental illness  osteoporosis  pain or difficulty passing urine  seizures  spinal cord metastasis  stroke  suicidal thoughts, plans, or attempt; a previous suicide attempt by you or a family member  tobacco smoker  unusual vaginal bleeding (women)  an unusual or allergic reaction to leuprolide, benzyl alcohol, other medicines, foods, dyes, or preservatives  pregnant or trying to get pregnant  breast-feeding How should I use this medicine? This medicine is for injection into a muscle or for injection under the skin. It is given by a health care professional in a hospital or clinic setting. The specific product will determine how it will be given to you. Make sure you understand which product you receive and how often you will receive it. Talk to your pediatrician regarding the use of   this medicine in children. Special care may be needed. Overdosage: If you think you have taken too much of this medicine contact a poison control center or emergency room at once. NOTE: This medicine is only for you. Do not share this medicine with others. What if I miss a dose? It is important not to miss a dose. Call your doctor or health care professional if you are unable to keep an appointment. Depot injections: Depot injections are given either once-monthly, every 12 weeks, every 16 weeks, or every 24 weeks depending on the product you are prescribed. The product you are prescribed will be based on if you  are male or male, and your condition. Make sure you understand your product and dosing. What may interact with this medicine? Do not take this medicine with any of the following medications:  chasteberry This medicine may also interact with the following medications:  herbal or dietary supplements, like black cohosh or DHEA  male hormones, like estrogens or progestins and birth control pills, patches, rings, or injections  male hormones, like testosterone This list may not describe all possible interactions. Give your health care provider a list of all the medicines, herbs, non-prescription drugs, or dietary supplements you use. Also tell them if you smoke, drink alcohol, or use illegal drugs. Some items may interact with your medicine. What should I watch for while using this medicine? Visit your doctor or health care professional for regular checks on your progress. During the first weeks of treatment, your symptoms may get worse, but then will improve as you continue your treatment. You may get hot flashes, increased bone pain, increased difficulty passing urine, or an aggravation of nerve symptoms. Discuss these effects with your doctor or health care professional, some of them may improve with continued use of this medicine. Male patients may experience a menstrual cycle or spotting during the first months of therapy with this medicine. If this continues, contact your doctor or health care professional. This medicine may increase blood sugar. Ask your healthcare provider if changes in diet or medicines are needed if you have diabetes. What side effects may I notice from receiving this medicine? Side effects that you should report to your doctor or health care professional as soon as possible:  allergic reactions like skin rash, itching or hives, swelling of the face, lips, or tongue  breathing problems  chest pain  depression or memory disorders  pain in your legs or  groin  pain at site where injected or implanted  seizures  severe headache  signs and symptoms of high blood sugar such as being more thirsty or hungry or having to urinate more than normal. You may also feel very tired or have blurry vision  swelling of the feet and legs  suicidal thoughts or other mood changes  visual changes  vomiting Side effects that usually do not require medical attention (report to your doctor or health care professional if they continue or are bothersome):  breast swelling or tenderness  decrease in sex drive or performance  diarrhea  hot flashes  loss of appetite  muscle, joint, or bone pains  nausea  redness or irritation at site where injected or implanted  skin problems or acne This list may not describe all possible side effects. Call your doctor for medical advice about side effects. You may report side effects to FDA at 1-800-FDA-1088. Where should I keep my medicine? This drug is given in a hospital or clinic and will not be  stored at home. NOTE: This sheet is a summary. It may not cover all possible information. If you have questions about this medicine, talk to your doctor, pharmacist, or health care provider.  2020 Elsevier/Gold Standard (2018-03-13 09:27:03)

## 2020-01-15 NOTE — Progress Notes (Signed)
Hematology and Oncology Follow Up   Wesley White [id number] [birth year] 69 y.o. 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   Prior Therapy:   He is status post laparoscopic radical prostatectomy and pelvic lymphadenectomy on March 05, 2016.  The final pathology showed Gleason score 4+3 equal 7 with 0 out of 9 lymph node involved.  He did have extraprostatic extension at that time.   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  He is status post radiation therapy adjuvantly under the care of Dr. Tammi Klippel completed on May 4 of 2018.  He received 68.4 Gray in 38 fractions.  Status post radiation therapy for isolated left iliac bone metastasis completed in June 2020.  Zytiga 1000 mg daily started in August 2020.  Therapy discontinued because of lack of coverage   He developed worsening back pain and a rise in his PSA up to 2.7 with MRI showed worsening disease in the spine including T12, T1 and T5.   Current therapy:   Eligard 30 mg every 4 months.  Next injection will be scheduled in December 2021.  Xgeva 120 mg every 2 months to start on January 15, 2020.  Under consideration to start therapy for his castration-resistant disease.   Interim History: Wesley White presents today for a repeat evaluation.  Since the last visit, he reports his pain is manageable but has to take ibuprofen few times a day.  He has not required any additional pain medication at this time.  He denies any constitutional symptoms or weight loss.  Denies any neurological deficits.                Medications: Updated without any changes. Current Outpatient Medications  Medication Sig Dispense Refill  . acetaminophen (TYLENOL) 325 MG tablet Take 650  mg by mouth every 6 (six) hours as needed for mild pain.    [name] CIALIS 5 MG tablet Take 5 mg by mouth daily. (Patient not taking: Reported on 01/12/2020)  11  . glucose blood test strip 1 each by Other route as needed. Use as instructed 100 each 11  . HYDROcodone-acetaminophen (NORCO) 5-325 MG tablet Take 1 tablet by mouth every 6 (six) hours as needed for moderate pain. (Patient not taking: Reported on 10/24/2018) 15 tablet 0  . ibuprofen (ADVIL) 600 MG tablet Take 600 mg by mouth every 6 (six) hours as needed.    . INVOKANA 300 MG TABS tablet TAKE 1 TABLET BY MOUTH EVERY DAY BEFORE BREAKFAST 90 tablet 1  . lisinopril (ZESTRIL) 5 MG tablet TAKE 1 TABLET BY MOUTH EVERY DAY 90 tablet 3  . metFORMIN (GLUCOPHAGE) 500 MG tablet TAKE 1 TABLET BY MOUTH TWICE A DAY WITH A MEAL 180 tablet 1  . naproxen sodium (ALEVE) 220 MG tablet Take 220 mg by mouth daily as needed.     [name] Delica Lancets 13Y MISC See admin instructions.    . rosuvastatin (CRESTOR) 20 MG tablet TAKE 1 TABLET BY MOUTH EVERY DAY 90 tablet 3   No current facility-administered medications for this visit.   Facility-Administered Medications Ordered in Other Visits  Medication Dose Route Frequency Provider Last Rate Last Admin  . magnesium citrate solution 1 Bottle  1 Bottle Oral Once Raynelle Bring, MD      .  sodium phosphate (FLEET) 7-19 GM/118ML enema 1 enema  1 enema Rectal Once Raynelle Bring, MD         Allergies: No Known Allergies    Physical Exam:  Blood pressure 119/65, pulse 72, temperature 98.4 F (36.9 C), temperature source Tympanic, resp. rate 17, height 5\' 7"  (1.702 m), weight 215 lb 3.2 oz (97.6 kg), SpO2 95 %.    ECOG: 1     General appearance: Alert, awake without any distress. Head: Atraumatic without abnormalities Oropharynx: Without any thrush or ulcers. Eyes: No scleral icterus. Lymph nodes: No lymphadenopathy noted in the cervical, supraclavicular, or axillary nodes Heart:regular rate and  rhythm, without any murmurs or gallops.   Lung: Clear to auscultation without any rhonchi, wheezes or dullness to percussion. Abdomin: Soft, nontender without any shifting dullness or ascites. Musculoskeletal: No clubbing or cyanosis. Neurological: No motor or sensory deficits. Skin: No rashes or lesions.             Lab Results: Lab Results  Component Value Date   WBC 4.8 12/31/2019   HGB 14.7 12/31/2019   HCT 43.9 12/31/2019   MCV 92.2 12/31/2019   PLT 226 12/31/2019     Chemistry      Component Value Date/Time   NA 138 12/31/2019 0845   NA 136 10/17/2018 1139   K 4.8 12/31/2019 0845   CL 103 12/31/2019 0845   CO2 26 12/31/2019 0845   BUN 11 12/31/2019 0845   BUN 11 10/17/2018 1139   CREATININE 0.79 12/31/2019 0845   CREATININE 0.81 06/08/2015 0001      Component Value Date/Time   CALCIUM 10.4 (H) 12/31/2019 0845   ALKPHOS 260 (H) 12/31/2019 0845   AST 24 12/31/2019 0845   ALT 27 12/31/2019 0845   BILITOT 0.6 12/31/2019 0845     Results for CAMARION, WEIER (MRN [id number]) as of 01/15/2020 12:23  Ref. Range 09/04/2019 10:45 12/31/2019 08:45  Prostate Specific Ag, Serum Latest Ref Range: 0.0 - 4.0 ng/mL <0.1 2.7        Impression and Plan:   69 year old man with:  1.  Advanced prostate cancer with disease to the bone diagnosed in June 2020.  He has castration-resistant disease documented in August 2021 after presenting with a rise in his PSA and worsening disease in the spine.  Laboratory data and imaging studies were discussed at this time.  His PSA has increased to 2.7 with MRI of the spine completed on August 13 showed a tumor in the left posterior T12 vertebral body in addition to abnormalities at T1 and T5.  In the has developed castration-resistant disease and this state of his disease was explained in detail to him and his wife.  His disease remains incurable but multiple treatment options exist.  These treatments are palliative with average life  expectancy between 2 to 3 years.  His options would include androgen synthesis inhibitors, androgen receptor blockade, systemic chemotherapy, PARP inhibitors if he harbors the appropriate mutation, and radiopharmaceuticals such as radium and potentially PSMA targeted therapies.  Risks and benefits of all these approaches were reviewed.  We have decided to proceed with Xtandi at this time.  Risks and benefits associated with this medication were crossed.  Complications that include nausea, fatigue, hypertension, hematuria and rarely seizures were reviewed.  Total 160 mg will be started and we will assess his response and tolerance next 2 months.    2.  Back pain: Related to progressive prostate cancer in his thoracic spine.  He is  scheduled to have radiation therapy in the near future.  Pain is manageable currently with nonnarcotic analgesia.  3.  Androgen deprivation: He received Eligard in December 2021.  This will be continued indefinitely with long-term complications weight gain and hot flashes among others.  4.  Bone directed therapy: He will be a good candidate for Xgeva.  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  Already on calcium supplements and will proceed with treatment today and repeated in 2 months.   5.  Prognosis and goals of care: His disease is incurable although aggressive measures are warranted given his excellent performance status.   6.  Follow-up: In 2 months for repeat evaluation.  40  minutes were dedicated to this visit.  The time was spent on reviewing laboratory data, reviewing imaging studies, discussing treatment options, discussing overall prognosis and future plan of care alternatives.   Zola Button, MD 01/15/2020 12:21 PM

## 2020-01-18 ENCOUNTER — Telehealth: Payer: Self-pay | Admitting: Oncology

## 2020-01-18 DIAGNOSIS — Z510 Encounter for antineoplastic radiation therapy: Secondary | ICD-10-CM | POA: Diagnosis not present

## 2020-01-18 DIAGNOSIS — C7951 Secondary malignant neoplasm of bone: Secondary | ICD-10-CM | POA: Diagnosis not present

## 2020-01-18 NOTE — Telephone Encounter (Signed)
Oral Oncology Pharmacist Encounter  Met with patient in [facility] lobby to obtain signature for [employer] form. Faxed patient assistance application to [employer].  Fax number: [phone]  Oral Oncology Clinic will continue to follow.  Leron Croak, PharmD, BCPS Hematology/Oncology Clinical Pharmacist [facility] [phone] 01/18/2020 3:27 PM

## 2020-01-18 NOTE — Telephone Encounter (Signed)
Scheduled appointments per 8/20 los. Patient is aware of appointment date and time.

## 2020-01-20 ENCOUNTER — Telehealth: Payer: Self-pay

## 2020-01-20 MED FILL — XTANDI 40 MG TABS: 40 | 30 days supply | Qty: 120 | Fill #0

## 2020-01-20 NOTE — Telephone Encounter (Signed)
Oral Chemotherapy Pharmacist Encounter  I spoke with patient for overview of: Xtandi for the treatment of advanced, castration-resistant prostate cancer in conjunction with Eligard, planned duration until disease progression or unacceptable toxicity.   Counseled patient on administration, dosing, side effects, monitoring, drug-food interactions, safe handling, storage, and disposal.  Patient will take Xtandi 40mg  capsules, 4 capsules (160mg ) by mouth once daily without regard to food.  Xtandi start date: 01/20/2020  Adverse effects include but are not limited to: peripheral edema, GI upset, hypertension, hot flashes, fatigue, falls/fractures, and arthralgias.   Patient instructed about small risk of seizures with Xtandi treatment.  Reviewed with patient importance of keeping a medication schedule and plan for any missed doses. No barriers to medication adherence identified. Discussed DDI with Xtandi and Hydrocodone-acetaminophen and Cialis - patient aware and expressed understanding.   Medication reconciliation performed and medication/allergy list updated.  Medication education handout placed in mail for patient.  Patient will pick up from [facility] on 01/20/20.  Patient informed the pharmacy will reach out 5-7 days prior to needing next fill of Xtandi to coordinate continued medication acquisition to prevent break in therapy.  All questions answered.  Mr. Parcel voiced understanding and appreciation.   Patient knows to call the office with questions or concerns.   Leron Croak, PharmD, BCPS Hematology/Oncology Clinical Pharmacist [facility] [phone] 01/20/2020 10:05 AM

## 2020-01-20 NOTE — Telephone Encounter (Signed)
Oral Oncology Patient Advocate Encounter  Patient has been approved for copay assistance with [employer] (TAF).  The [place] will cover all copayment expenses for Xtandi for the remainder of the calendar year.    The billing information is as follows and has been shared with [place].   Member ID: [id number]  Group ID: [id number] PCN: [id number] BIN: [id number] Eligibility Dates: 05/29/19 to 05/27/20  Fund: [place] Patient [place] Phone [phone] Fax [phone] 01/20/2020 9:05 AM

## 2020-01-20 NOTE — Telephone Encounter (Signed)
Xtandi support referred patient to get a grant through [employer].   I was successful in obtaining TAF grant good through 05/27/20.  [place] Patient [place] Phone [phone] Fax [phone] 01/20/2020 11:54 AM

## 2020-01-21 ENCOUNTER — Other Ambulatory Visit: Payer: Self-pay

## 2020-01-21 ENCOUNTER — Telehealth: Payer: Self-pay

## 2020-01-21 ENCOUNTER — Other Ambulatory Visit: Payer: Self-pay | Admitting: Family Medicine

## 2020-01-21 ENCOUNTER — Ambulatory Visit
Admission: RE | Admit: 2020-01-21 | Discharge: 2020-01-21 | Disposition: A | Discharge status: Home / Self Care | Payer: HMO | Source: Ambulatory Visit | Attending: Radiation Oncology | Admitting: Radiation Oncology

## 2020-01-21 DIAGNOSIS — Z510 Encounter for antineoplastic radiation therapy: Secondary | ICD-10-CM | POA: Diagnosis not present

## 2020-01-21 DIAGNOSIS — C7951 Secondary malignant neoplasm of bone: Secondary | ICD-10-CM | POA: Diagnosis not present

## 2020-01-21 NOTE — Telephone Encounter (Signed)
Pt called asking for Pain med . Pt is starting chemo soon and the over the counter med are not helping any more. Please advise [facility]

## 2020-01-21 NOTE — Telephone Encounter (Signed)
Have him work with his oncologist concerning this.  I can be his backup but they should be the ones handling this.

## 2020-01-22 ENCOUNTER — Other Ambulatory Visit: Payer: Self-pay | Admitting: Radiation Oncology

## 2020-01-22 ENCOUNTER — Ambulatory Visit
Admission: RE | Admit: 2020-01-22 | Discharge: 2020-01-22 | Disposition: A | Discharge status: Home / Self Care | Payer: HMO | Source: Ambulatory Visit | Attending: Radiation Oncology | Admitting: Radiation Oncology

## 2020-01-22 DIAGNOSIS — Z510 Encounter for antineoplastic radiation therapy: Secondary | ICD-10-CM | POA: Diagnosis not present

## 2020-01-22 DIAGNOSIS — C7951 Secondary malignant neoplasm of bone: Secondary | ICD-10-CM | POA: Diagnosis not present

## 2020-01-22 MED ORDER — TRAMADOL HCL 50 MG PO TABS
50.0000 mg | ORAL_TABLET | Freq: Four times a day (QID) | ORAL | 0 refills | Status: AC | PRN
Start: 2020-01-22 — End: 2020-01-29

## 2020-01-25 ENCOUNTER — Telehealth: Payer: Self-pay | Admitting: Radiation Oncology

## 2020-01-25 ENCOUNTER — Ambulatory Visit
Admission: RE | Admit: 2020-01-25 | Discharge: 2020-01-25 | Disposition: A | Discharge status: Home / Self Care | Payer: HMO | Source: Ambulatory Visit | Attending: Radiation Oncology | Admitting: Radiation Oncology

## 2020-01-25 ENCOUNTER — Other Ambulatory Visit: Payer: Self-pay | Admitting: Urology

## 2020-01-25 ENCOUNTER — Other Ambulatory Visit: Payer: Self-pay

## 2020-01-25 DIAGNOSIS — Z510 Encounter for antineoplastic radiation therapy: Secondary | ICD-10-CM | POA: Diagnosis not present

## 2020-01-25 DIAGNOSIS — C7951 Secondary malignant neoplasm of bone: Secondary | ICD-10-CM | POA: Diagnosis not present

## 2020-01-25 MED ORDER — OXYCODONE-ACETAMINOPHEN 5-325 MG PO TABS
1.0000 | ORAL_TABLET | Freq: Four times a day (QID) | ORAL | 0 refills | Status: DC | PRN
Start: 2020-01-25 — End: 2021-03-28

## 2020-01-25 NOTE — Telephone Encounter (Signed)
I am happy to send a Rx for Percocet to get him through until the radiation kicks in.  Let him know that I will send this and he can try this in place of the Tramadol. Do not take them together. Ailene Ards

## 2020-01-25 NOTE — Telephone Encounter (Signed)
Received voicemail message from patient requesting return call. Phoned patient back promptly. Patient reports a new pain on Friday while waiting at the pharmacy for his tramadol. Patient reports this pain has continued over the weekend. Patient reports terrible upper leg pain that radiates into his calves, bilateral hip and low back pain. Patient reports taking Tramadol every six hours as prescribed (script for 7 days only) but it wears off after four so he then takes Aleve. Denies edema of lower extremities. Denies changes in bowel or bladder. Endorses taking Xtandi as prescribed. Patient scheduled for radiation at 1455 today. As of Friday patient had received 2 of 10 intended fractions to his thoracic spine.   Patient understands this RN will communicate these findings to the providers and phone him back with further directions.  Note: Malignant neoplasm of prostate now with mets to bone. Originally dx in July 2017.

## 2020-01-26 ENCOUNTER — Ambulatory Visit
Admission: RE | Admit: 2020-01-26 | Discharge: 2020-01-26 | Disposition: A | Discharge status: Home / Self Care | Payer: HMO | Source: Ambulatory Visit | Attending: Radiation Oncology | Admitting: Radiation Oncology

## 2020-01-26 ENCOUNTER — Other Ambulatory Visit: Payer: Self-pay

## 2020-01-26 DIAGNOSIS — Z510 Encounter for antineoplastic radiation therapy: Secondary | ICD-10-CM | POA: Diagnosis not present

## 2020-01-26 DIAGNOSIS — C7951 Secondary malignant neoplasm of bone: Secondary | ICD-10-CM | POA: Diagnosis not present

## 2020-01-27 ENCOUNTER — Other Ambulatory Visit: Payer: Self-pay

## 2020-01-27 ENCOUNTER — Telehealth: Payer: Self-pay | Admitting: Radiation Oncology

## 2020-01-27 ENCOUNTER — Ambulatory Visit
Admission: RE | Admit: 2020-01-27 | Discharge: 2020-01-27 | Disposition: A | Discharge status: Home / Self Care | Payer: HMO | Source: Ambulatory Visit | Attending: Radiation Oncology | Admitting: Radiation Oncology

## 2020-01-27 DIAGNOSIS — C7951 Secondary malignant neoplasm of bone: Secondary | ICD-10-CM | POA: Insufficient documentation

## 2020-01-27 DIAGNOSIS — Z510 Encounter for antineoplastic radiation therapy: Secondary | ICD-10-CM | POA: Diagnosis not present

## 2020-01-27 DIAGNOSIS — C61 Malignant neoplasm of prostate: Secondary | ICD-10-CM | POA: Diagnosis not present

## 2020-01-27 NOTE — Telephone Encounter (Signed)
Phoned patient again. Patient answered. Explained Percocet has been sent to [facility] at the corner of [address] to get him through until the radiation kicks in. Explained this will take the place of the Tramadol. Stressed he should not take tramadol and percocet together. Patient verbalized understanding of all reviewed and appreciation for the assistance.

## 2020-01-27 NOTE — Telephone Encounter (Signed)
Attempting to reach patient to discuss new script sent in by [employer], PA-C. No answer. Unable to leave voicemail message since it hasn't been set up. Will attempt later today to reach patient again.

## 2020-01-28 ENCOUNTER — Ambulatory Visit
Admission: RE | Admit: 2020-01-28 | Discharge: 2020-01-28 | Disposition: A | Discharge status: Home / Self Care | Payer: HMO | Source: Ambulatory Visit | Attending: Radiation Oncology | Admitting: Radiation Oncology

## 2020-01-28 ENCOUNTER — Other Ambulatory Visit: Payer: Self-pay

## 2020-01-28 DIAGNOSIS — Z510 Encounter for antineoplastic radiation therapy: Secondary | ICD-10-CM | POA: Diagnosis not present

## 2020-01-28 DIAGNOSIS — C7951 Secondary malignant neoplasm of bone: Secondary | ICD-10-CM | POA: Diagnosis not present

## 2020-01-29 ENCOUNTER — Ambulatory Visit
Admission: RE | Admit: 2020-01-29 | Discharge: 2020-01-29 | Disposition: A | Discharge status: Home / Self Care | Payer: HMO | Source: Ambulatory Visit | Attending: Radiation Oncology | Admitting: Radiation Oncology

## 2020-01-29 ENCOUNTER — Other Ambulatory Visit: Payer: Self-pay

## 2020-01-29 DIAGNOSIS — C7951 Secondary malignant neoplasm of bone: Secondary | ICD-10-CM | POA: Diagnosis not present

## 2020-01-29 DIAGNOSIS — Z510 Encounter for antineoplastic radiation therapy: Secondary | ICD-10-CM | POA: Diagnosis not present

## 2020-02-01 ENCOUNTER — Other Ambulatory Visit: Payer: Self-pay | Admitting: Family Medicine

## 2020-02-01 DIAGNOSIS — E119 Type 2 diabetes mellitus without complications: Secondary | ICD-10-CM

## 2020-02-02 ENCOUNTER — Other Ambulatory Visit: Payer: Self-pay

## 2020-02-02 ENCOUNTER — Ambulatory Visit
Admission: RE | Admit: 2020-02-02 | Discharge: 2020-02-02 | Disposition: A | Discharge status: Home / Self Care | Payer: HMO | Source: Ambulatory Visit | Attending: Radiation Oncology | Admitting: Radiation Oncology

## 2020-02-02 ENCOUNTER — Other Ambulatory Visit: Payer: Self-pay | Admitting: Oncology

## 2020-02-02 DIAGNOSIS — C7951 Secondary malignant neoplasm of bone: Secondary | ICD-10-CM | POA: Diagnosis not present

## 2020-02-02 DIAGNOSIS — Z510 Encounter for antineoplastic radiation therapy: Secondary | ICD-10-CM | POA: Diagnosis not present

## 2020-02-02 MED ORDER — ENZALUTAMIDE 40 MG PO TABS
160.0000 mg | ORAL_TABLET | Freq: Every day | ORAL | 3 refills | Status: DC
Start: 2020-02-02 — End: 2020-05-31

## 2020-02-02 MED FILL — XTANDI 40 MG TABS: 40 | 30 days supply | Qty: 120 | Fill #0

## 2020-02-03 ENCOUNTER — Ambulatory Visit
Admission: RE | Admit: 2020-02-03 | Discharge: 2020-02-03 | Disposition: A | Discharge status: Home / Self Care | Payer: HMO | Source: Ambulatory Visit | Attending: Radiation Oncology | Admitting: Radiation Oncology

## 2020-02-03 ENCOUNTER — Other Ambulatory Visit: Payer: Self-pay

## 2020-02-03 DIAGNOSIS — C7951 Secondary malignant neoplasm of bone: Secondary | ICD-10-CM | POA: Diagnosis not present

## 2020-02-03 DIAGNOSIS — Z510 Encounter for antineoplastic radiation therapy: Secondary | ICD-10-CM | POA: Diagnosis not present

## 2020-02-04 ENCOUNTER — Encounter: Payer: Self-pay | Admitting: Urology

## 2020-02-04 ENCOUNTER — Ambulatory Visit
Admission: RE | Admit: 2020-02-04 | Discharge: 2020-02-04 | Disposition: A | Discharge status: Home / Self Care | Payer: HMO | Source: Ambulatory Visit | Attending: Radiation Oncology | Admitting: Radiation Oncology

## 2020-02-04 ENCOUNTER — Other Ambulatory Visit: Payer: Self-pay

## 2020-02-04 DIAGNOSIS — Z510 Encounter for antineoplastic radiation therapy: Secondary | ICD-10-CM | POA: Diagnosis not present

## 2020-02-04 DIAGNOSIS — C7951 Secondary malignant neoplasm of bone: Secondary | ICD-10-CM | POA: Diagnosis not present

## 2020-02-04 NOTE — Telephone Encounter (Signed)
Pt advised that oncology has given pain med. Wesley White

## 2020-02-25 ENCOUNTER — Encounter: Payer: Self-pay | Admitting: Family Medicine

## 2020-03-07 MED FILL — XTANDI 40 MG TABS: 40 | 30 days supply | Qty: 120 | Fill #1

## 2020-03-08 ENCOUNTER — Encounter: Payer: Self-pay | Admitting: Urology

## 2020-03-08 ENCOUNTER — Other Ambulatory Visit: Payer: Self-pay

## 2020-03-08 NOTE — Progress Notes (Signed)
Radiation Oncology         [phone]) [phone] ________________________________  Name: Wesley White MRN: [id number]  Date: 03/09/2020  DOB: 01-Aug-1950  Post Treatment Note  CC: [name], MD  [name], MD  Diagnosis:   69 y.o. 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  Interval Since Last Radiation:  4.5 weeks  01/21/20 - 02/04/20: The patient received 30 Gy in 10 fractions to T11 to L4, inclusive.  12/09/2018 - 12/19/2018: The isolated oligometastasis (left iliac lesion) was treated to 40 Gy in 5 fractions of 8 Gy (SBRT)  08/08/2016 - 09/28/2016:The prostatic fossa was treated to 68.4 Gy in 38 fractions of 1.8 Gy  Narrative:  I spoke with the patient to conduct his routine scheduled 1 month follow up visit via telephone to spare the patient unnecessary potential exposure in the healthcare setting during the current COVID-19 pandemic.  The patient was notified in advance and gave permission to proceed with this visit format.   He tolerated radiation treatment relatively well with no ill side effects noted.                              On review of systems, the patient states that he is doing very well in general.  He did have some lingering fatigue but over the past 2 to 3 days, this has resolved which he is thrilled with.  He is also beyond thrilled with the fact that his pain has resolved and he is no longer requiring narcotic pain medications for management of his symptoms.  He was able to go on his planned trip up [place] to [place] following completion of his radiation and was pain-free so he was able to enjoy the trip.  He denies any focal weakness or paresthesias and is overall extremely pleased with his progress to date.  He continues to tolerate the Xtandi (started 01/15/2020) and Eligard and has a scheduled follow-up visit with [name] on 03/11/2020.  ALLERGIES:  has No Known Allergies.  Meds: Current  Outpatient Medications  Medication Sig Dispense Refill  . enzalutamide (XTANDI) 40 MG tablet Take 4 tablets (160 mg total) by mouth daily. 120 tablet 3  . INVOKANA 300 MG TABS tablet TAKE 1 TABLET BY MOUTH EVERY DAY BEFORE BREAKFAST 90 tablet 1  . lisinopril (ZESTRIL) 5 MG tablet TAKE 1 TABLET BY MOUTH EVERY DAY 90 tablet 3  . metFORMIN (GLUCOPHAGE) 500 MG tablet TAKE 1 TABLET BY MOUTH TWICE A DAY WITH A MEAL 180 tablet 1  . OneTouch Delica Lancets 32R MISC See admin instructions.    [name] VERIO test strip USE AS DIRECTED AS NEEDED 100 strip 11  . rosuvastatin (CRESTOR) 20 MG tablet TAKE 1 TABLET BY MOUTH EVERY DAY 90 tablet 3  . acetaminophen (TYLENOL) 325 MG tablet Take 650 mg by mouth every 6 (six) hours as needed for mild pain. (Patient not taking: Reported on 03/08/2020)    . CIALIS 5 MG tablet Take 5 mg by mouth daily. (Patient not taking: Reported on 01/12/2020)  11  . HYDROcodone-acetaminophen (NORCO) 5-325 MG tablet Take 1 tablet by mouth every 6 (six) hours as needed for moderate pain. (Patient not taking: Reported on 10/24/2018) 15 tablet 0  . ibuprofen (ADVIL) 600 MG tablet Take 600 mg by mouth every 6 (six) hours as needed. (Patient not taking: Reported on 03/08/2020)    .  naproxen sodium (ALEVE) 220 MG tablet Take 220 mg by mouth daily as needed.  (Patient not taking: Reported on 03/08/2020)    . oxyCODONE-acetaminophen (PERCOCET/ROXICET) 5-325 MG tablet Take 1-2 tablets by mouth every 6 (six) hours as needed for moderate pain or severe pain. (Patient not taking: Reported on 03/08/2020) 60 tablet 0   No current facility-administered medications for this encounter.   Facility-Administered Medications Ordered in Other Encounters  Medication Dose Route Frequency Provider Last Rate Last Admin  . magnesium citrate solution 1 Bottle  1 Bottle Oral Once Raynelle Bring, MD      . sodium phosphate (FLEET) 7-19 GM/118ML enema 1 enema  1 enema Rectal Once Raynelle Bring, MD         Physical Findings:  vitals were not taken for this visit.   /10 Unable to assess due to telephone follow up visit format.  Lab Findings: Lab Results  Component Value Date   WBC 4.8 12/31/2019   HGB 14.7 12/31/2019   HCT 43.9 12/31/2019   MCV 92.2 12/31/2019   PLT 226 12/31/2019     Radiographic Findings: No results found.  Impression/Plan: 1. 69 y.o. 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 The patient appears to have recovered appropriately from the effects of his recent radiotherapy and has noted significant pain relief in his low back.  He is quite pleased with his progress to date.  We discussed that while we are happy to continue to participate in his care if clinically indicated, at this point, we will plan to see him back on an as-needed basis.  He will continue in routine follow-up for systemic disease management under the care and direction of [name] with his next scheduled visit coming up on 03/11/2020.  He will continue with Lupron ADT every 4 months (next injection is due in December 2021) and [place] daily. He appears to have a good understanding and is comfortable and in agreement with the stated plan. He knows that he is welcome to call at anytime with any questions or concerns related to his previous radiotherapy.     Nicholos Johns, PA-C

## 2020-03-08 NOTE — Progress Notes (Signed)
  Radiation Oncology         [phone]) [phone] ________________________________  Name: Wesley White MRN: [id number]  Date: 02/04/2020  DOB: 1951-03-14  End of Treatment Note  Diagnosis:    69 y.o. 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     Indication for treatment:  Palliation       Radiation treatment dates:   01/21/20 - 02/04/20  Site/dose:   The patient received 30 Gy in 10 fractions to T11 to L4, inclusive.  Beams/energy:   A VMAT plan with 6FFF fields were used.  Narrative: The patient tolerated radiation treatment relatively well.   No side effects noted.  Plan: The patient has completed radiation treatment. The patient will return to radiation oncology clinic for routine followup in one month. I advised him to call or return sooner if he has any questions or concerns related to his recovery or treatment. ________________________________  Sheral Apley. Tammi Klippel, M.D.

## 2020-03-08 NOTE — Progress Notes (Addendum)
Patient meaningful use is done. Patient is aware that this is a phone visit. Patient states that he does not have an appointment with his urologist . Patient states that  He  Has mild fatigue.Patient denies any dysuria or hematuria. Patient reports nocturia 1-2. Patient deneis any urgency. Patient states that he empties his bladder with urination. Patient reports that his stream is moderate to strong. Patient denies any issues with his bowels. Patients IPPS was 2. Patient states that he had his booster covid shot.

## 2020-03-09 ENCOUNTER — Ambulatory Visit
Admission: RE | Admit: 2020-03-09 | Discharge: 2020-03-09 | Disposition: A | Discharge status: Home / Self Care | Payer: HMO | Source: Ambulatory Visit | Attending: Urology | Admitting: Urology

## 2020-03-09 DIAGNOSIS — C61 Malignant neoplasm of prostate: Secondary | ICD-10-CM

## 2020-03-09 DIAGNOSIS — C7951 Secondary malignant neoplasm of bone: Secondary | ICD-10-CM

## 2020-03-09 NOTE — Addendum Note (Signed)
Encounter addended by: Freeman Caldron, PA-C on: 03/09/2020 10:36 AM  Actions taken: Level of Service modified

## 2020-03-10 ENCOUNTER — Ambulatory Visit (HOSPITAL_COMMUNITY)
Admission: RE | Admit: 2020-03-10 | Discharge: 2020-03-10 | Disposition: A | Discharge status: Home / Self Care | Payer: HMO | Source: Ambulatory Visit | Attending: Internal Medicine | Admitting: Internal Medicine

## 2020-03-10 ENCOUNTER — Other Ambulatory Visit: Payer: Self-pay

## 2020-03-10 DIAGNOSIS — M79661 Pain in right lower leg: Secondary | ICD-10-CM | POA: Diagnosis not present

## 2020-03-10 DIAGNOSIS — E119 Type 2 diabetes mellitus without complications: Secondary | ICD-10-CM | POA: Insufficient documentation

## 2020-03-10 DIAGNOSIS — M79662 Pain in left lower leg: Secondary | ICD-10-CM

## 2020-03-11 ENCOUNTER — Inpatient Hospital Stay: Payer: HMO

## 2020-03-11 ENCOUNTER — Inpatient Hospital Stay: Payer: HMO | Attending: Oncology

## 2020-03-11 ENCOUNTER — Other Ambulatory Visit: Payer: Self-pay

## 2020-03-11 ENCOUNTER — Inpatient Hospital Stay: Payer: HMO | Admitting: Oncology

## 2020-03-11 VITALS — BP 120/92 | HR 95 | Temp 97.2°F | Resp 18 | Ht 67.0 in | Wt 208.6 lb

## 2020-03-11 VITALS — BP 130/79 | HR 89 | Temp 98.3°F | Resp 18

## 2020-03-11 DIAGNOSIS — C7951 Secondary malignant neoplasm of bone: Secondary | ICD-10-CM | POA: Diagnosis not present

## 2020-03-11 DIAGNOSIS — C61 Malignant neoplasm of prostate: Secondary | ICD-10-CM | POA: Diagnosis not present

## 2020-03-11 DIAGNOSIS — Z79899 Other long term (current) drug therapy: Secondary | ICD-10-CM | POA: Insufficient documentation

## 2020-03-11 DIAGNOSIS — Z923 Personal history of irradiation: Secondary | ICD-10-CM | POA: Diagnosis not present

## 2020-03-11 DIAGNOSIS — M549 Dorsalgia, unspecified: Secondary | ICD-10-CM | POA: Diagnosis not present

## 2020-03-11 LAB — CBC WITH DIFFERENTIAL (CANCER CENTER ONLY)
Abs Immature Granulocytes: 0.03 10*3/uL (ref 0.00–0.07)
Basophils Absolute: 0 10*3/uL (ref 0.0–0.1)
Basophils Relative: 1 %
Eosinophils Absolute: 0.1 10*3/uL (ref 0.0–0.5)
Eosinophils Relative: 1 %
HCT: 42.4 % (ref 39.0–52.0)
Hemoglobin: 14.4 g/dL (ref 13.0–17.0)
Immature Granulocytes: 1 %
Lymphocytes Relative: 13 %
Lymphs Abs: 0.5 10*3/uL — ABNORMAL LOW (ref 0.7–4.0)
MCH: 31.4 pg (ref 26.0–34.0)
MCHC: 34 g/dL (ref 30.0–36.0)
MCV: 92.6 fL (ref 80.0–100.0)
Monocytes Absolute: 0.4 10*3/uL (ref 0.1–1.0)
Monocytes Relative: 10 %
Neutro Abs: 2.5 10*3/uL (ref 1.7–7.7)
Neutrophils Relative %: 74 %
Platelet Count: 238 10*3/uL (ref 150–400)
RBC: 4.58 MIL/uL (ref 4.22–5.81)
RDW: 13.9 % (ref 11.5–15.5)
WBC Count: 3.5 10*3/uL — ABNORMAL LOW (ref 4.0–10.5)
nRBC: 0 % (ref 0.0–0.2)

## 2020-03-11 LAB — CMP (CANCER CENTER ONLY)
ALT: 18 U/L (ref 0–44)
AST: 15 U/L (ref 15–41)
Albumin: 3.8 g/dL (ref 3.5–5.0)
Alkaline Phosphatase: 69 U/L (ref 38–126)
Anion gap: 6 (ref 5–15)
BUN: 18 mg/dL (ref 8–23)
CO2: 30 mmol/L (ref 22–32)
Calcium: 10.1 mg/dL (ref 8.9–10.3)
Chloride: 104 mmol/L (ref 98–111)
Creatinine: 0.81 mg/dL (ref 0.61–1.24)
GFR, Estimated: >60 mL/min (ref >60)
Glucose, Bld: 122 mg/dL — ABNORMAL HIGH (ref 70–99)
Potassium: 5.2 mmol/L — ABNORMAL HIGH (ref 3.5–5.1)
Sodium: 140 mmol/L (ref 135–145)
Total Bilirubin: 0.5 mg/dL (ref 0.3–1.2)
Total Protein: 7.1 g/dL (ref 6.5–8.1)

## 2020-03-11 MED ORDER — DENOSUMAB 120 MG/1.7ML ~~LOC~~ SOLN
120.0000 mg | Freq: Once | SUBCUTANEOUS | Status: AC
  Administered 2020-03-11: 120 mg via SUBCUTANEOUS

## 2020-03-11 MED ORDER — DENOSUMAB 120 MG/1.7ML ~~LOC~~ SOLN
SUBCUTANEOUS | Status: AC
  Filled 2020-03-11: qty 1.7

## 2020-03-11 NOTE — Patient Instructions (Addendum)
Denosumab injection What is this medicine? DENOSUMAB (den oh sue mab) slows bone breakdown. Prolia is used to treat osteoporosis in women after menopause and in men, and in people who are taking corticosteroids for 6 months or more. Xgeva is used to treat a high calcium level due to cancer and to prevent bone fractures and other bone problems caused by multiple myeloma or cancer bone metastases. Xgeva is also used to treat giant cell tumor of the bone. This medicine may be used for other purposes; ask your health care provider or pharmacist if you have questions. COMMON BRAND NAME(S): Prolia, XGEVA What should I tell my health care provider before I take this medicine? They need to know if you have any of these conditions:  dental disease  having surgery or tooth extraction  infection  kidney disease  low levels of calcium or Vitamin D in the blood  malnutrition  on hemodialysis  skin conditions or sensitivity  thyroid or parathyroid disease  an unusual reaction to denosumab, other medicines, foods, dyes, or preservatives  pregnant or trying to get pregnant  breast-feeding How should I use this medicine? This medicine is for injection under the skin. It is given by a health care professional in a hospital or clinic setting. A special MedGuide will be given to you before each treatment. Be sure to read this information carefully each time. For Prolia, talk to your pediatrician regarding the use of this medicine in children. Special care may be needed. For Xgeva, talk to your pediatrician regarding the use of this medicine in children. While this drug may be prescribed for children as young as 13 years for selected conditions, precautions do apply. Overdosage: If you think you have taken too much of this medicine contact a poison control center or emergency room at once. NOTE: This medicine is only for you. Do not share this medicine with others. What if I miss a dose? It is  important not to miss your dose. Call your doctor or health care professional if you are unable to keep an appointment. What may interact with this medicine? Do not take this medicine with any of the following medications:  other medicines containing denosumab This medicine may also interact with the following medications:  medicines that lower your chance of fighting infection  steroid medicines like prednisone or cortisone This list may not describe all possible interactions. Give your health care provider a list of all the medicines, herbs, non-prescription drugs, or dietary supplements you use. Also tell them if you smoke, drink alcohol, or use illegal drugs. Some items may interact with your medicine. What should I watch for while using this medicine? Visit your doctor or health care professional for regular checks on your progress. Your doctor or health care professional may order blood tests and other tests to see how you are doing. Call your doctor or health care professional for advice if you get a fever, chills or sore throat, or other symptoms of a cold or flu. Do not treat yourself. This drug may decrease your body's ability to fight infection. Try to avoid being around people who are sick. You should make sure you get enough calcium and vitamin D while you are taking this medicine, unless your doctor tells you not to. Discuss the foods you eat and the vitamins you take with your health care professional. See your dentist regularly. Brush and floss your teeth as directed. Before you have any dental work done, tell your dentist you are   receiving this medicine. Do not become pregnant while taking this medicine or for 5 months after stopping it. Talk with your doctor or health care professional about your birth control options while taking this medicine. Women should inform their doctor if they wish to become pregnant or think they might be pregnant. There is a potential for serious side  effects to an unborn child. Talk to your health care professional or pharmacist for more information. What side effects may I notice from receiving this medicine? Side effects that you should report to your doctor or health care professional as soon as possible:  allergic reactions like skin rash, itching or hives, swelling of the face, lips, or tongue  bone pain  breathing problems  dizziness  jaw pain, especially after dental work  redness, blistering, peeling of the skin  signs and symptoms of infection like fever or chills; cough; sore throat; pain or trouble passing urine  signs of low calcium like fast heartbeat, muscle cramps or muscle pain; pain, tingling, numbness in the hands or feet; seizures  unusual bleeding or bruising  unusually weak or tired Side effects that usually do not require medical attention (report to your doctor or health care professional if they continue or are bothersome):  constipation  diarrhea  headache  joint pain  loss of appetite  muscle pain  runny nose  tiredness  upset stomach This list may not describe all possible side effects. Call your doctor for medical advice about side effects. You may report side effects to FDA at 1-800-FDA-1088. Where should I keep my medicine? This medicine is only given in a clinic, doctor's office, or other health care setting and will not be stored at home. NOTE: This sheet is a summary. It may not cover all possible information. If you have questions about this medicine, talk to your doctor, pharmacist, or health care provider.  2020 Elsevier/Gold Standard (2017-09-20 16:10:44)  

## 2020-03-11 NOTE — Progress Notes (Signed)
Hematology and Oncology Follow Up   Wesley White [id number] 10-Jun-1950 69 y.o. 03/11/2020 11:41 AM [name], MDLalonde, [name], MD     Principle Diagnosis: 69 year old man with castration-resistant prostate cancer with disease to the bone diagnosed in 2020.  He had presented with PSA was 10.4 and a Gleason score 4+3 = 7 in 2017.   Prior Therapy:   He is status post laparoscopic radical prostatectomy and pelvic lymphadenectomy on March 05, 2016.  The final pathology showed Gleason score 4+3 equal 7 with 0 out of 9 lymph node involved.  He did have extraprostatic extension at that time.   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  He is status post radiation therapy adjuvantly under the care of Dr. Tammi Klippel completed on May 4 of 2018.  He received 68.4 Gray in 38 fractions.  Status post radiation therapy for isolated left iliac bone metastasis completed in June 2020.  Zytiga 1000 mg daily started in August 2020.  Therapy discontinued because of lack of coverage   He developed worsening back pain and a rise in his PSA up to 2.7 with MRI showed worsening disease in the spine including T12, T1 and T5.  He is status post radiation therapy to the T11 and L4 completed on February 04, 2020 after completing 30 Gray in 10 fractions.   Current therapy:   Eligard 30 mg every 4 months.  Next injection will be scheduled in December 2021.  Xgeva 120 mg every 2 months.   Xtandi 160 mg daily started on January 20, 2020.   Interim History: Mr. Wesley White returns today for a follow-up visit.  Since last visit, he reports significant improvement in his back pain after completing radiation therapy.  He denies any nausea, vomiting pain.  He denies worsening bone pain or back pain.  He also started [place] without any new complaints.  He denies any excessive fatigue or lower extremity edema.  His performance status quality of life remain  excellent.                Medications: Updated without any changes. Current Outpatient Medications  Medication Sig Dispense Refill  . acetaminophen (TYLENOL) 325 MG tablet Take 650 mg by mouth every 6 (six) hours as needed for mild pain. (Patient not taking: Reported on 03/08/2020)    . CIALIS 5 MG tablet Take 5 mg by mouth daily. (Patient not taking: Reported on 01/12/2020)  11  . enzalutamide (XTANDI) 40 MG tablet Take 4 tablets (160 mg total) by mouth daily. 120 tablet 3  . HYDROcodone-acetaminophen (NORCO) 5-325 MG tablet Take 1 tablet by mouth every 6 (six) hours as needed for moderate pain. (Patient not taking: Reported on 10/24/2018) 15 tablet 0  . ibuprofen (ADVIL) 600 MG tablet Take 600 mg by mouth every 6 (six) hours as needed. (Patient not taking: Reported on 03/08/2020)    . INVOKANA 300 MG TABS tablet TAKE 1 TABLET BY MOUTH EVERY DAY BEFORE BREAKFAST 90 tablet 1  . lisinopril (ZESTRIL) 5 MG tablet TAKE 1 TABLET BY MOUTH EVERY DAY 90 tablet 3  . metFORMIN (GLUCOPHAGE) 500 MG tablet TAKE 1 TABLET BY MOUTH TWICE A DAY WITH A MEAL 180 tablet 1  . naproxen sodium (ALEVE) 220 MG tablet Take 220 mg by mouth daily as needed.  (Patient not taking: Reported on 03/08/2020)    . OneTouch Delica Lancets 24M MISC See admin instructions.    [name] VERIO test strip USE AS DIRECTED AS  NEEDED 100 strip 11  . oxyCODONE-acetaminophen (PERCOCET/ROXICET) 5-325 MG tablet Take 1-2 tablets by mouth every 6 (six) hours as needed for moderate pain or severe pain. (Patient not taking: Reported on 03/08/2020) 60 tablet 0  . rosuvastatin (CRESTOR) 20 MG tablet TAKE 1 TABLET BY MOUTH EVERY DAY 90 tablet 3   No current facility-administered medications for this visit.   Facility-Administered Medications Ordered in Other Visits  Medication Dose Route Frequency Provider Last Rate Last Admin  . magnesium citrate solution 1 Bottle  1 Bottle Oral Once Raynelle Bring, MD      . sodium phosphate  (FLEET) 7-19 GM/118ML enema 1 enema  1 enema Rectal Once Raynelle Bring, MD           Physical Exam:  Blood pressure (!) 120/92, pulse 95, temperature (!) 97.2 F (36.2 C), resp. rate 18, height 5\' 7"  (1.702 m), weight 208 lb 9.6 oz (94.6 kg), SpO2 98 %.     ECOG: 1   General appearance: Comfortable appearing without any discomfort Head: Normocephalic without any trauma Oropharynx: Mucous membranes are moist and pink without any thrush or ulcers. Eyes: Pupils are equal and round reactive to light. Lymph nodes: No cervical, supraclavicular, inguinal or axillary lymphadenopathy.   Heart:regular rate and rhythm.  S1 and S2 without leg edema. Lung: Clear without any rhonchi or wheezes.  No dullness to percussion. Abdomin: Soft, nontender, nondistended with good bowel sounds.  No hepatosplenomegaly. Musculoskeletal: No joint deformity or effusion.  Full range of motion noted. Neurological: No deficits noted on motor, sensory and deep tendon reflex exam. Skin: No petechial rash or dryness.  Appeared moist.               Lab Results: Lab Results  Component Value Date   WBC 3.5 (L) 03/11/2020   HGB 14.4 03/11/2020   HCT 42.4 03/11/2020   MCV 92.6 03/11/2020   PLT 238 03/11/2020     Chemistry      Component Value Date/Time   NA 138 12/31/2019 0845   NA 136 10/17/2018 1139   K 4.8 12/31/2019 0845   CL 103 12/31/2019 0845   CO2 26 12/31/2019 0845   BUN 11 12/31/2019 0845   BUN 11 10/17/2018 1139   CREATININE 0.79 12/31/2019 0845   CREATININE 0.81 06/08/2015 0001      Component Value Date/Time   CALCIUM 10.4 (H) 12/31/2019 0845   ALKPHOS 260 (H) 12/31/2019 0845   AST 24 12/31/2019 0845   ALT 27 12/31/2019 0845   BILITOT 0.6 12/31/2019 0845     Results for Wesley, [name] (MRN [id number]) as of 01/15/2020 12:23  Ref. Range 09/04/2019 10:45 12/31/2019 08:45  Prostate Specific Ag, Serum Latest Ref Range: 0.0 - 4.0 ng/mL <0.1 2.7        Impression and  Plan:   69 year old man with:  1.  Castration-resistant prostate cancer with disease to the bone documented in August 2021 after initially presenting with advanced disease in June 2020.    He is currently receiving Xtandi which she has tolerated reasonably well so far without any major complaints.  Risks and benefits of continuing this therapy were discussed.  Complications that include nausea, fatigue, edema and hypertension were reviewed.  His PSA is currently pending and will continue to monitor at this time.  Different salvage therapy will be needed if he has progression of disease in the future.     2.  Back pain: Improved after radiation therapy and is no longer requiring any  pain medication.  3.  Androgen deprivation: He received last injection in August and will be repeated in December 2021.  Complications including weight gain and hot flashes were reiterated.   4.  Bone directed therapy: He would receive Xgeva today and repeated in 2 months.  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  He is agreeable to continue.   5.  Prognosis and goals of care: Therapy remains palliative at this time although aggressive measures are warranted.  6.  Follow-up: In 2 months for repeat evaluation and next injection appointment.  30  minutes were spent on this encounter.  Time was dedicated to reviewing laboratory data, disease status update and outlining future plan of care.   Zola Button, MD 03/11/2020 11:41 AM

## 2020-03-11 NOTE — Patient Outreach (Signed)
  [facility]) Care Management Chronic Special Needs Program    03/11/2020  Name: Wesley White, DOB: [birth year]  MRN: [id number]   Mr. Merwyn Hodapp is enrolled in a chronic special needs plan for Diabetes. Telephone call to client for CSNP assessment follow up. HIPAA verified. Client states he is unable to talk at this time. Request call back at a later time.   PLAN;  RNCM will attempt 2nd telephone call to client in 2 weeks.   Quinn Plowman RN,BSN,CCM [facility] Management [phone]

## 2020-03-12 LAB — PROSTATE-SPECIFIC AG, SERUM (LABCORP): Prostate Specific Ag, Serum: 0.5 ng/mL (ref 0.0–4.0)

## 2020-03-14 ENCOUNTER — Telehealth: Payer: Self-pay

## 2020-03-14 NOTE — Telephone Encounter (Signed)
-----   Message from [name], MD sent at 03/14/2020  8:24 AM EDT ----- Please let him know his PSA is down

## 2020-03-14 NOTE — Telephone Encounter (Signed)
Called patient and made him aware of PSA result. He verbalized understanding.

## 2020-03-16 ENCOUNTER — Ambulatory Visit: Payer: Self-pay

## 2020-03-21 ENCOUNTER — Other Ambulatory Visit: Payer: Self-pay

## 2020-03-21 NOTE — Patient Outreach (Signed)
[facility]) Care Management Chronic Special Needs Program  03/21/2020  Name: Wesley White DOB: 04/24/1951  MRN: [id number]  Mr. Wesley White is enrolled in a chronic special needs plan for Diabetes. Telephone call to client for CSNP assessment follow up. HIPAA verified. Client states he is doing ok.  He reports regular follow up visits with his primary care provider. Client reports he recently completed 10 radiation treatments due to cancer spots located on his spine. Client reports next follow up visit with his oncologist in 1 month. Client reports he has been able to obtain all medications at this time. Client reports frequent times of fatigue. He reports he is still undergoing cancer treatment.   Goals Addressed              This Visit's Progress   .  "bring A1c back under 7.0" (pt-stated)        Discussed diabetes self management actions:  Glucose monitoring per provider recommendation  Check feet daily  Visit provider every 3-6 months as directed  Hbg A1C level every 3-6 months.  Eye Exam yearly  Carbohydrate controlled meal planning  Taking diabetes medication as prescribed by provider  Physical activity     .  COMPLETED: Client understands the importance of follow-up with providers by attending scheduled visits   On track     Primary MD visit completed 02/05/19, 08/13/19 and 01/12/20 Oncology visit completed 05/06/19, and 01/15/20. Client reports next follow up with oncologist is in 1 month     .  client will report decrease in fatigue in 12 months.        Continue reported exercise (walking) as tolerated Consider massage or yoga to help with fatigue (consult your doctor first) Maintain a healthy diet (see heart healthy diet sent to you in the mail) Take time to rest/ relax as needed.     .  Client will verbalize knowledge of self management of Hypertension as evidences by BP reading of 140/90 or less; or as defined by provider   On track     RN  case manager will send client education article: Checking your blood pressure at home and heart healthy diet.   Do not skip doses of your blood pressure medicines unless advised by your doctor Plan to eat low salt and heart healthy meals full of fruits, vegetables, whole grains, lean protein and limit fat and sugars.      [name]  HEMOGLOBIN A1C < 7        Your last documented Hgb A1c is 7.1.   Continue to follow diabetes self management actions:  Glucose monitoring per provider recommendation  Check feet daily  Visit provider every 3-6 months as directed  Hbg A1C level every 3-6 months.  Eye Exam yearly   Carbohydrate controlled meal planning  Taking diabetes medication as prescribed by provider  Physical activity     .  Maintain timely refills of diabetic medication as prescribed within the year .   On track     Client reports maintaining timely refills of diabetic medication Contact your RN case manager if you are unable to obtain your medications Contact your doctor if you have questions.     .  COMPLETED: Obtain annual  Lipid Profile, LDL-C   On track     Lipid profile completed 01/12/20    .  COMPLETED: Obtain Annual Eye (retinal)  Exam    On track     Client reports annual eye exam completed 03/15/20    .  Obtain Annual Foot Exam   On track     Annual foot exam completed 01/12/20    .  COMPLETED: Obtain annual screen for micro albuminuria (urine) , nephropathy (kidney problems)   On track     Annual Micro albumin completed: 08/13/19    .  COMPLETED: Obtain Hemoglobin A1C at least 2 times per year   On track     Hgb A1c completed on 08/13/19 and 01/12/20    .  COMPLETED: Visit Primary Care Provider or Endocrinologist at least 2 times per year    On track     Primary MD visit completed 08/13/19 and 01/12/20       Plan:  Send successful outreach letter with a copy of their individualized care plan, Send individual care plan to provider and Send educational material  Chronic  care management coordinator will outreach in:  12 months   Wesley Plowman RN,BSN,CCM [facility] [phone]    .

## 2020-04-04 MED FILL — XTANDI 40 MG TABS: 40 | 30 days supply | Qty: 120 | Fill #2

## 2020-04-11 ENCOUNTER — Other Ambulatory Visit: Payer: Self-pay

## 2020-04-11 NOTE — Patient Outreach (Signed)
  [facility]) Care Management Chronic Special Needs Program    04/11/2020  Name: Wesley White, DOB: 1950-11-15  MRN: [id number]   Mr. [name] is enrolled in a chronic special needs plan for Diabetes  [place] [facility] will continue to provide services for this member through 05/27/20.  The [facility] care management team will assume care 05/28/2020.   Quinn Plowman RN,BSN,CCM Chronic Care Management Coordinator [facility] [phone]  .

## 2020-04-24 ENCOUNTER — Other Ambulatory Visit: Payer: Self-pay | Admitting: Family Medicine

## 2020-04-24 DIAGNOSIS — E119 Type 2 diabetes mellitus without complications: Secondary | ICD-10-CM

## 2020-04-27 ENCOUNTER — Encounter: Payer: Self-pay | Admitting: Family Medicine

## 2020-05-04 MED FILL — XTANDI 40 MG TABS: 40 | 30 days supply | Qty: 120 | Fill #3

## 2020-05-05 ENCOUNTER — Telehealth: Payer: Self-pay | Admitting: Oncology

## 2020-05-05 NOTE — Telephone Encounter (Signed)
Scheduled per los, patient has been called and notified. 

## 2020-05-06 ENCOUNTER — Inpatient Hospital Stay (HOSPITAL_BASED_OUTPATIENT_CLINIC_OR_DEPARTMENT_OTHER): Payer: HMO | Admitting: Oncology

## 2020-05-06 ENCOUNTER — Other Ambulatory Visit: Payer: Self-pay

## 2020-05-06 ENCOUNTER — Inpatient Hospital Stay: Payer: HMO

## 2020-05-06 ENCOUNTER — Inpatient Hospital Stay: Payer: HMO | Attending: Oncology

## 2020-05-06 VITALS — BP 94/76 | HR 92 | Temp 96.6°F | Wt 210.0 lb

## 2020-05-06 DIAGNOSIS — C7951 Secondary malignant neoplasm of bone: Secondary | ICD-10-CM | POA: Diagnosis not present

## 2020-05-06 DIAGNOSIS — M549 Dorsalgia, unspecified: Secondary | ICD-10-CM | POA: Insufficient documentation

## 2020-05-06 DIAGNOSIS — R232 Flushing: Secondary | ICD-10-CM | POA: Insufficient documentation

## 2020-05-06 DIAGNOSIS — Z923 Personal history of irradiation: Secondary | ICD-10-CM | POA: Diagnosis not present

## 2020-05-06 DIAGNOSIS — Z79899 Other long term (current) drug therapy: Secondary | ICD-10-CM | POA: Diagnosis not present

## 2020-05-06 DIAGNOSIS — R5383 Other fatigue: Secondary | ICD-10-CM | POA: Insufficient documentation

## 2020-05-06 DIAGNOSIS — C61 Malignant neoplasm of prostate: Secondary | ICD-10-CM

## 2020-05-06 LAB — CMP (CANCER CENTER ONLY)
ALT: 19 U/L (ref 0–44)
AST: 17 U/L (ref 15–41)
Albumin: 3.7 g/dL (ref 3.5–5.0)
Alkaline Phosphatase: 71 U/L (ref 38–126)
Anion gap: 8 (ref 5–15)
BUN: 14 mg/dL (ref 8–23)
CO2: 28 mmol/L (ref 22–32)
Calcium: 10.2 mg/dL (ref 8.9–10.3)
Chloride: 104 mmol/L (ref 98–111)
Creatinine: 0.77 mg/dL (ref 0.61–1.24)
GFR, Estimated: >60 mL/min (ref >60)
Glucose, Bld: 139 mg/dL — ABNORMAL HIGH (ref 70–99)
Potassium: 5.6 mmol/L — ABNORMAL HIGH (ref 3.5–5.1)
Sodium: 140 mmol/L (ref 135–145)
Total Bilirubin: 0.4 mg/dL (ref 0.3–1.2)
Total Protein: 6.8 g/dL (ref 6.5–8.1)

## 2020-05-06 LAB — CBC WITH DIFFERENTIAL (CANCER CENTER ONLY)
Abs Immature Granulocytes: 0.03 10*3/uL (ref 0.00–0.07)
Basophils Absolute: 0 10*3/uL (ref 0.0–0.1)
Basophils Relative: 1 %
Eosinophils Absolute: 0 10*3/uL (ref 0.0–0.5)
Eosinophils Relative: 1 %
HCT: 41.9 % (ref 39.0–52.0)
Hemoglobin: 14.2 g/dL (ref 13.0–17.0)
Immature Granulocytes: 1 %
Lymphocytes Relative: 13 %
Lymphs Abs: 0.5 10*3/uL — ABNORMAL LOW (ref 0.7–4.0)
MCH: 31.1 pg (ref 26.0–34.0)
MCHC: 33.9 g/dL (ref 30.0–36.0)
MCV: 91.7 fL (ref 80.0–100.0)
Monocytes Absolute: 0.4 10*3/uL (ref 0.1–1.0)
Monocytes Relative: 12 %
Neutro Abs: 2.6 10*3/uL (ref 1.7–7.7)
Neutrophils Relative %: 72 %
Platelet Count: 216 10*3/uL (ref 150–400)
RBC: 4.57 MIL/uL (ref 4.22–5.81)
RDW: 13.3 % (ref 11.5–15.5)
WBC Count: 3.6 10*3/uL — ABNORMAL LOW (ref 4.0–10.5)
nRBC: 0 % (ref 0.0–0.2)

## 2020-05-06 MED ORDER — LEUPROLIDE ACETATE (4 MONTH) 30 MG ~~LOC~~ KIT
PACK | SUBCUTANEOUS | Status: AC
  Filled 2020-05-06: qty 30

## 2020-05-06 MED ORDER — LEUPROLIDE ACETATE (4 MONTH) 30 MG ~~LOC~~ KIT
30.0000 mg | PACK | Freq: Once | SUBCUTANEOUS | Status: AC
  Administered 2020-05-06: 30 mg via SUBCUTANEOUS

## 2020-05-06 MED ORDER — DENOSUMAB 120 MG/1.7ML ~~LOC~~ SOLN
SUBCUTANEOUS | Status: AC
  Filled 2020-05-06: qty 1.7

## 2020-05-06 MED ORDER — DENOSUMAB 120 MG/1.7ML ~~LOC~~ SOLN
120.0000 mg | Freq: Once | SUBCUTANEOUS | Status: AC
Start: 2020-05-06 — End: 2020-05-06
  Administered 2020-05-06: 120 mg via SUBCUTANEOUS

## 2020-05-06 NOTE — Progress Notes (Signed)
Hematology and Oncology Follow Up   Wesley White [id number] March 09, 1951 69 y.o. 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   Prior Therapy:   He is status post laparoscopic radical prostatectomy and pelvic lymphadenectomy on March 05, 2016.  The final pathology showed Gleason score 4+3 equal 7 with 0 out of 9 lymph node involved.  He did have extraprostatic extension at that time.   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  He is status post radiation therapy adjuvantly under the care of Dr. Tammi Klippel completed on May 4 of 2018.  He received 68.4 Gray in 38 fractions.  Status post radiation therapy for isolated left iliac bone metastasis completed in June 2020.  Zytiga 1000 mg daily started in August 2020.  Therapy discontinued because of lack of coverage   He developed worsening back pain and a rise in his PSA up to 2.7 with MRI showed worsening disease in the spine including T12, T1 and T5.  He is status post radiation therapy to the T11 and L4 completed on February 04, 2020 after completing 30 Gray in 10 fractions.   Current therapy:   Eligard 30 mg every 4 months.  Next injection will be scheduled in December 2021.  Xgeva 120 mg every 2 months.   Xtandi 160 mg daily started on January 20, 2020.   Interim History: Mr. [name] is here for a follow-up visit.  Since the last visit, he reports no major changes in his health. He continues to tolerate Xtandi without any recent complaints. He denies any nausea, vomiting or worsening bone pain. He denies pathological fractures. He is no longer taking any pain medication. Continues to be active and attends to activities of daily living. He does report some mild fatigue and occasional  hot flashes.               Medications: Unchanged on review. Current Outpatient Medications  Medication Sig Dispense Refill  . acetaminophen (TYLENOL) 325 MG tablet Take 650 mg by mouth every 6 (six) hours as needed for mild pain.     [name] CIALIS 5 MG tablet Take 5 mg by mouth daily. (Patient not taking: Reported on 01/12/2020)  11  . enzalutamide (XTANDI) 40 MG tablet Take 4 tablets (160 mg total) by mouth daily. 120 tablet 3  . HYDROcodone-acetaminophen (NORCO) 5-325 MG tablet Take 1 tablet by mouth every 6 (six) hours as needed for moderate pain. (Patient not taking: Reported on 03/21/2020) 15 tablet 0  . ibuprofen (ADVIL) 600 MG tablet Take 600 mg by mouth every 6 (six) hours as needed. (Patient not taking: Reported on 03/08/2020)    . INVOKANA 300 MG TABS tablet TAKE 1 TABLET BY MOUTH EVERY DAY BEFORE BREAKFAST 90 tablet 1  . lisinopril (ZESTRIL) 5 MG tablet TAKE 1 TABLET BY MOUTH EVERY DAY 90 tablet 3  . metFORMIN (GLUCOPHAGE) 500 MG tablet TAKE 1 TABLET BY MOUTH TWICE A DAY WITH A MEAL 180 tablet 1  . naproxen sodium (ALEVE) 220 MG tablet Take 220 mg by mouth daily as needed.     [name] Delica Lancets 28U MISC See admin instructions.    [name] VERIO test strip USE AS DIRECTED AS NEEDED 100 strip 11  .  oxyCODONE-acetaminophen (PERCOCET/ROXICET) 5-325 MG tablet Take 1-2 tablets by mouth every 6 (six) hours as needed for moderate pain or severe pain. (Patient not taking: Reported on 03/08/2020) 60 tablet 0  . rosuvastatin (CRESTOR) 20 MG tablet TAKE 1 TABLET BY MOUTH EVERY DAY 90 tablet 3   No current facility-administered medications for this visit.   Facility-Administered Medications Ordered in Other Visits  Medication Dose Route Frequency Provider Last Rate Last Admin  . magnesium citrate solution 1 Bottle  1 Bottle Oral Once Raynelle Bring, MD      . sodium phosphate (FLEET) 7-19 GM/118ML enema 1 enema  1 enema Rectal Once Raynelle Bring, MD           Physical  Exam:       ECOG: 1   General appearance: Alert, awake without any distress. Head: Atraumatic without abnormalities Oropharynx: Without any thrush or ulcers. Eyes: No scleral icterus. Lymph nodes: No lymphadenopathy noted in the cervical, supraclavicular, or axillary nodes Heart:regular rate and rhythm, without any murmurs or gallops.   Lung: Clear to auscultation without any rhonchi, wheezes or dullness to percussion. Abdomin: Soft, nontender without any shifting dullness or ascites. Musculoskeletal: No clubbing or cyanosis. Neurological: No motor or sensory deficits. Skin: No rashes or lesions.              Lab Results: Lab Results  Component Value Date   WBC 3.5 (L) 03/11/2020   HGB 14.4 03/11/2020   HCT 42.4 03/11/2020   MCV 92.6 03/11/2020   PLT 238 03/11/2020     Chemistry      Component Value Date/Time   NA 140 03/11/2020 1128   NA 136 10/17/2018 1139   K 5.2 (H) 03/11/2020 1128   CL 104 03/11/2020 1128   CO2 30 03/11/2020 1128   BUN 18 03/11/2020 1128   BUN 11 10/17/2018 1139   CREATININE 0.81 03/11/2020 1128   CREATININE 0.81 06/08/2015 0001      Component Value Date/Time   CALCIUM 10.1 03/11/2020 1128   ALKPHOS 69 03/11/2020 1128   AST 15 03/11/2020 1128   ALT 18 03/11/2020 1128   BILITOT 0.5 03/11/2020 1128        Results for Wesley, White (MRN [id number]) as of 05/06/2020 10:45  Ref. Range 12/31/2019 08:45 03/11/2020 11:28  Prostate Specific Ag, Serum Latest Ref Range: 0.0 - 4.0 ng/mL 2.7 0.5      Impression and Plan:   69 year old man with:  1.  Advanced prostate cancer with disease to the bone noted in June 2020.  He developed castration-resistant in August 2021.[name]    The natural course of his disease was updated today including treatment options and laboratory data review.  His PSA continues to show reasonable response to [facility] and declined down to 0.5.  Risks and benefits of continuing this treatment were discussed.   Complications occluding hypertension, weight gain, fatigue among others were reiterated.  Alternative options would be systemic chemotherapy which is deferred at this time. He is agreeable to continue at this time.     2.  Back pain: Related to prostate cancer to the bone with radiation help palliate his symptoms. He is no longer having any pain or taking any pain medication.  3.  Androgen deprivation: He will receive Eligard today and repeated in 4 months.  Complications including weight gain, hot flashes were reiterated.  He is agreeable to proceed.  4.  Bone directed therapy: He is currently on Xgeva which will be given every 2 months.  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   5.  Prognosis and goals of care: His disease is incurable although aggressive measures are warranted given his excellent performance status.  6.  Follow-up: He will return in 8 weeks for repeat follow-up.  30  minutes were dedicated to this visit.  The time was spent on reviewing laboratory data, discussing disease status, addressing complications lead to cancer and cancer therapy.   Zola Button, MD 05/06/2020 10:44 AM

## 2020-05-07 LAB — PROSTATE-SPECIFIC AG, SERUM (LABCORP): Prostate Specific Ag, Serum: 0.2 ng/mL (ref 0.0–4.0)

## 2020-05-09 ENCOUNTER — Telehealth: Payer: Self-pay | Admitting: *Deleted

## 2020-05-09 NOTE — Telephone Encounter (Signed)
-----   Message from [name], MD sent at 05/09/2020  8:29 AM EST ----- Please let him know his PSA is down.

## 2020-05-09 NOTE — Telephone Encounter (Signed)
Per Dr. Hazeline Junker request relayed PSA results.  No further questions.

## 2020-05-31 ENCOUNTER — Other Ambulatory Visit: Payer: Self-pay | Admitting: Oncology

## 2020-05-31 ENCOUNTER — Other Ambulatory Visit: Payer: Self-pay

## 2020-06-06 ENCOUNTER — Telehealth: Payer: Self-pay

## 2020-06-06 NOTE — Telephone Encounter (Signed)
Oral Oncology Patient Advocate Encounter   Was successful in securing patient a $[id number] grant from [employer] to provide copayment coverage for Xtandi.  This will keep the out of pocket expense at $0.       The billing information is as follows and has been shared with [place].   Member ID: [id number] Group ID: [id number] RxBin: [id number] PCN: [id number] Dates of Eligibility: 06/01/20 through 06/01/21  Fund name:  Prostate.   [place] Patient [place] Phone [phone] Fax [phone] 06/06/2020 11:47 AM

## 2020-06-09 MED FILL — XTANDI 40 MG TABS: 40 | 30 days supply | Qty: 120 | Fill #0

## 2020-07-01 ENCOUNTER — Other Ambulatory Visit: Payer: Self-pay

## 2020-07-01 ENCOUNTER — Inpatient Hospital Stay: Payer: HMO | Attending: Oncology

## 2020-07-01 ENCOUNTER — Inpatient Hospital Stay: Payer: HMO

## 2020-07-01 ENCOUNTER — Inpatient Hospital Stay (HOSPITAL_BASED_OUTPATIENT_CLINIC_OR_DEPARTMENT_OTHER): Payer: HMO | Admitting: Oncology

## 2020-07-01 VITALS — BP 127/88 | HR 91 | Temp 98.4°F | Resp 18

## 2020-07-01 VITALS — BP 119/80 | HR 85 | Temp 96.1°F | Resp 18 | Ht 67.0 in | Wt 215.5 lb

## 2020-07-01 DIAGNOSIS — M549 Dorsalgia, unspecified: Secondary | ICD-10-CM | POA: Diagnosis not present

## 2020-07-01 DIAGNOSIS — Z923 Personal history of irradiation: Secondary | ICD-10-CM | POA: Insufficient documentation

## 2020-07-01 DIAGNOSIS — C61 Malignant neoplasm of prostate: Secondary | ICD-10-CM

## 2020-07-01 DIAGNOSIS — Z79899 Other long term (current) drug therapy: Secondary | ICD-10-CM | POA: Diagnosis not present

## 2020-07-01 DIAGNOSIS — C7951 Secondary malignant neoplasm of bone: Secondary | ICD-10-CM

## 2020-07-01 LAB — CMP (CANCER CENTER ONLY)
ALT: 15 U/L (ref 0–44)
AST: 13 U/L — ABNORMAL LOW (ref 15–41)
Albumin: 3.8 g/dL (ref 3.5–5.0)
Alkaline Phosphatase: 53 U/L (ref 38–126)
Anion gap: 10 (ref 5–15)
BUN: 16 mg/dL (ref 8–23)
CO2: 23 mmol/L (ref 22–32)
Calcium: 9.5 mg/dL (ref 8.9–10.3)
Chloride: 103 mmol/L (ref 98–111)
Creatinine: 0.74 mg/dL (ref 0.61–1.24)
GFR, Estimated: >60 mL/min (ref >60)
Glucose, Bld: 162 mg/dL — ABNORMAL HIGH (ref 70–99)
Potassium: 4.5 mmol/L (ref 3.5–5.1)
Sodium: 136 mmol/L (ref 135–145)
Total Bilirubin: 0.5 mg/dL (ref 0.3–1.2)
Total Protein: 6.8 g/dL (ref 6.5–8.1)

## 2020-07-01 LAB — CBC WITH DIFFERENTIAL (CANCER CENTER ONLY)
Abs Immature Granulocytes: 0.03 10*3/uL (ref 0.00–0.07)
Basophils Absolute: 0 10*3/uL (ref 0.0–0.1)
Basophils Relative: 1 %
Eosinophils Absolute: 0.1 10*3/uL (ref 0.0–0.5)
Eosinophils Relative: 2 %
HCT: 41.9 % (ref 39.0–52.0)
Hemoglobin: 14.2 g/dL (ref 13.0–17.0)
Immature Granulocytes: 1 %
Lymphocytes Relative: 16 %
Lymphs Abs: 0.6 10*3/uL — ABNORMAL LOW (ref 0.7–4.0)
MCH: 30.9 pg (ref 26.0–34.0)
MCHC: 33.9 g/dL (ref 30.0–36.0)
MCV: 91.1 fL (ref 80.0–100.0)
Monocytes Absolute: 0.4 10*3/uL (ref 0.1–1.0)
Monocytes Relative: 10 %
Neutro Abs: 2.8 10*3/uL (ref 1.7–7.7)
Neutrophils Relative %: 70 %
Platelet Count: 228 10*3/uL (ref 150–400)
RBC: 4.6 MIL/uL (ref 4.22–5.81)
RDW: 13 % (ref 11.5–15.5)
WBC Count: 4 10*3/uL (ref 4.0–10.5)
nRBC: 0 % (ref 0.0–0.2)

## 2020-07-01 MED ORDER — DENOSUMAB 120 MG/1.7ML ~~LOC~~ SOLN
120.0000 mg | Freq: Once | SUBCUTANEOUS | Status: AC
  Administered 2020-07-01: 120 mg via SUBCUTANEOUS

## 2020-07-01 MED ORDER — DENOSUMAB 120 MG/1.7ML ~~LOC~~ SOLN
SUBCUTANEOUS | Status: AC
  Filled 2020-07-01: qty 1.7

## 2020-07-01 NOTE — Progress Notes (Signed)
Hematology and Oncology Follow Up   Wesley White [id number] 1950-12-24 70 y.o. 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  He has presented with PSA was 10.4 and a Gleason score 4+3 = 7 in 2017.   Prior Therapy:   He is status post laparoscopic radical prostatectomy and pelvic lymphadenectomy on March 05, 2016.  The final pathology showed Gleason score 4+3 equal 7 with 0 out of 9 lymph node involved.  He did have extraprostatic extension at that time.   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  He is status post radiation therapy adjuvantly under the care of Dr. Tammi Klippel completed on May 4 of 2018.  He received 68.4 Gray in 38 fractions.  Status post radiation therapy for isolated left iliac bone metastasis completed in June 2020.  Zytiga 1000 mg daily started in August 2020.  Therapy discontinued because of lack of coverage   He developed worsening back pain and a rise in his PSA up to 2.7 with MRI showed worsening disease in the spine including T12, T1 and T5.  He is status post radiation therapy to the T11 and L4 completed on February 04, 2020 after completing 30 Gray in 10 fractions.   Current therapy:   Eligard 30 mg every 4 months.  Next Eligard will be given in April 2022.  Xgeva 120 mg every 2 months.   Xtandi 160 mg daily started on January 20, 2020.   Interim History: Wesley White is here for repeat evaluation.  Since the last visit, he reports feeling well without any complaints at this time.  He denies any nausea, vomiting or bone pain.  He continues to tolerate Xtandi without any difficulty ambulating.  He denies any excessive fatigue or weight loss.  He denies any seizure activities.  He remains active and continues to attempt activities of daily living.               Medications:  Updated on review. Current Outpatient Medications  Medication Sig Dispense Refill  . acetaminophen (TYLENOL) 325 MG tablet Take 650 mg by mouth every 6 (six) hours as needed for mild pain.     [name] CIALIS 5 MG tablet Take 5 mg by mouth daily. (Patient not taking: Reported on 01/12/2020)  11  . HYDROcodone-acetaminophen (NORCO) 5-325 MG tablet Take 1 tablet by mouth every 6 (six) hours as needed for moderate pain. (Patient not taking: Reported on 03/21/2020) 15 tablet 0  . ibuprofen (ADVIL) 600 MG tablet Take 600 mg by mouth every 6 (six) hours as needed. (Patient not taking: Reported on 03/08/2020)    . INVOKANA 300 MG TABS tablet TAKE 1 TABLET BY MOUTH EVERY DAY BEFORE BREAKFAST 90 tablet 1  . lisinopril (ZESTRIL) 5 MG tablet TAKE 1 TABLET BY MOUTH EVERY DAY 90 tablet 3  . metFORMIN (GLUCOPHAGE) 500 MG tablet TAKE 1 TABLET BY MOUTH TWICE A DAY WITH A MEAL 180 tablet 1  . naproxen sodium (ALEVE) 220 MG tablet Take 220 mg by mouth daily as needed.     [name] Delica Lancets 70W MISC See admin instructions.    [name] VERIO test strip USE AS DIRECTED AS NEEDED 100 strip 11  . oxyCODONE-acetaminophen (PERCOCET/ROXICET) 5-325 MG tablet Take 1-2 tablets by mouth every 6 (six) hours as needed for moderate pain or severe pain. (Patient not  taking: Reported on 03/08/2020) 60 tablet 0  . rosuvastatin (CRESTOR) 20 MG tablet TAKE 1 TABLET BY MOUTH EVERY DAY 90 tablet 3  . XTANDI 40 MG tablet TAKE 4 TABLETS (160 MG TOTAL) BY MOUTH DAILY. 120 tablet 3   No current facility-administered medications for this visit.   Facility-Administered Medications Ordered in Other Visits  Medication Dose Route Frequency Provider Last Rate Last Admin  . magnesium citrate solution 1 Bottle  1 Bottle Oral Once Raynelle Bring, MD      . sodium phosphate (FLEET) 7-19 GM/118ML enema 1 enema  1 enema Rectal Once Raynelle Bring, MD           Physical Exam:  Blood pressure 119/80, pulse 85, temperature (!) 96.1 F (35.6  C), temperature source Tympanic, resp. rate 18, height 5\' 7"  (1.702 m), weight 215 lb 8 oz (97.8 kg), SpO2 98 %.       ECOG: 1    General appearance: Comfortable appearing without any discomfort Head: Normocephalic without any trauma Oropharynx: Mucous membranes are moist and pink without any thrush or ulcers. Eyes: Pupils are equal and round reactive to light. Lymph nodes: No cervical, supraclavicular, inguinal or axillary lymphadenopathy.   Heart:regular rate and rhythm.  S1 and S2 without leg edema. Lung: Clear without any rhonchi or wheezes.  No dullness to percussion. Abdomin: Soft, nontender, nondistended with good bowel sounds.  No hepatosplenomegaly. Musculoskeletal: No joint deformity or effusion.  Full range of motion noted. Neurological: No deficits noted on motor, sensory and deep tendon reflex exam. Skin: No petechial rash or dryness.  Appeared moist.  Psychiatric: Mood and affect appeared appropriate.              Lab Results: Lab Results  Component Value Date   WBC 3.6 (L) 05/06/2020   HGB 14.2 05/06/2020   HCT 41.9 05/06/2020   MCV 91.7 05/06/2020   PLT 216 05/06/2020     Chemistry      Component Value Date/Time   NA 140 05/06/2020 1044   NA 136 10/17/2018 1139   K 5.6 (H) 05/06/2020 1044   CL 104 05/06/2020 1044   CO2 28 05/06/2020 1044   BUN 14 05/06/2020 1044   BUN 11 10/17/2018 1139   CREATININE 0.77 05/06/2020 1044   CREATININE 0.81 06/08/2015 0001      Component Value Date/Time   CALCIUM 10.2 05/06/2020 1044   ALKPHOS 71 05/06/2020 1044   AST 17 05/06/2020 1044   ALT 19 05/06/2020 1044   BILITOT 0.4 05/06/2020 1044         Results for Wesley White, Wesley White (MRN [id number]) as of 07/01/2020 08:41  Ref. Range 03/11/2020 11:28 05/06/2020 10:44  Prostate Specific Ag, Serum Latest Ref Range: 0.0 - 4.0 ng/mL 0.5 0.2      Impression and Plan:   70 year old man with:  1.  Castration-resistant advanced prostate cancer with disease  to the bone diagnosed in June 2020.    His disease status was updated at this time and treatment options were discussed.  He is currently on Xtandi with excellent PSA response currently.  Risks and benefits of continuing this treatment and alternative treatment options were discussed including systemic chemotherapy.  After discussion he is agreeable to continue given his overall tolerance and response.  We will update his staging scans in June or August 2022.     2.  Back pain: Resolved at this time and no longer taking medication.  3.  Androgen deprivation: He will receive Eligard in  2 months.  Complication clinic weight gain and hot flashes were reviewed.  This will continue indefinitely.   4.  Bone directed therapy: Risks and benefits of continuing Xgeva were reviewed today.  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  He will receive it today and repeated in 2 months.   5.  Prognosis and goals of care: Therapy remains palliative with aggressive measures are warranted given his excellent performance status.  6.  Follow-up: In 2 months for a follow-up visit.  30  minutes were spent on this encounter.  The time was dedicated to reviewing his disease status, treatment options and addressing complications related to his cancer and cancer therapy.   Zola Button, MD 07/01/2020 8:40 AM

## 2020-07-01 NOTE — Patient Instructions (Signed)
Denosumab injection What is this medicine? DENOSUMAB (den oh sue mab) slows bone breakdown. Prolia is used to treat osteoporosis in women after menopause and in men, and in people who are taking corticosteroids for 6 months or more. Xgeva is used to treat a high calcium level due to cancer and to prevent bone fractures and other bone problems caused by multiple myeloma or cancer bone metastases. Xgeva is also used to treat giant cell tumor of the bone. This medicine may be used for other purposes; ask your health care provider or pharmacist if you have questions. COMMON BRAND NAME(S): Prolia, XGEVA What should I tell my health care provider before I take this medicine? They need to know if you have any of these conditions:  dental disease  having surgery or tooth extraction  infection  kidney disease  low levels of calcium or Vitamin D in the blood  malnutrition  on hemodialysis  skin conditions or sensitivity  thyroid or parathyroid disease  an unusual reaction to denosumab, other medicines, foods, dyes, or preservatives  pregnant or trying to get pregnant  breast-feeding How should I use this medicine? This medicine is for injection under the skin. It is given by a health care professional in a hospital or clinic setting. A special MedGuide will be given to you before each treatment. Be sure to read this information carefully each time. For Prolia, talk to your pediatrician regarding the use of this medicine in children. Special care may be needed. For Xgeva, talk to your pediatrician regarding the use of this medicine in children. While this drug may be prescribed for children as young as 13 years for selected conditions, precautions do apply. Overdosage: If you think you have taken too much of this medicine contact a poison control center or emergency room at once. NOTE: This medicine is only for you. Do not share this medicine with others. What if I miss a dose? It is  important not to miss your dose. Call your doctor or health care professional if you are unable to keep an appointment. What may interact with this medicine? Do not take this medicine with any of the following medications:  other medicines containing denosumab This medicine may also interact with the following medications:  medicines that lower your chance of fighting infection  steroid medicines like prednisone or cortisone This list may not describe all possible interactions. Give your health care provider a list of all the medicines, herbs, non-prescription drugs, or dietary supplements you use. Also tell them if you smoke, drink alcohol, or use illegal drugs. Some items may interact with your medicine. What should I watch for while using this medicine? Visit your doctor or health care professional for regular checks on your progress. Your doctor or health care professional may order blood tests and other tests to see how you are doing. Call your doctor or health care professional for advice if you get a fever, chills or sore throat, or other symptoms of a cold or flu. Do not treat yourself. This drug may decrease your body's ability to fight infection. Try to avoid being around people who are sick. You should make sure you get enough calcium and vitamin D while you are taking this medicine, unless your doctor tells you not to. Discuss the foods you eat and the vitamins you take with your health care professional. See your dentist regularly. Brush and floss your teeth as directed. Before you have any dental work done, tell your dentist you are   receiving this medicine. Do not become pregnant while taking this medicine or for 5 months after stopping it. Talk with your doctor or health care professional about your birth control options while taking this medicine. Women should inform their doctor if they wish to become pregnant or think they might be pregnant. There is a potential for serious side  effects to an unborn child. Talk to your health care professional or pharmacist for more information. What side effects may I notice from receiving this medicine? Side effects that you should report to your doctor or health care professional as soon as possible:  allergic reactions like skin rash, itching or hives, swelling of the face, lips, or tongue  bone pain  breathing problems  dizziness  jaw pain, especially after dental work  redness, blistering, peeling of the skin  signs and symptoms of infection like fever or chills; cough; sore throat; pain or trouble passing urine  signs of low calcium like fast heartbeat, muscle cramps or muscle pain; pain, tingling, numbness in the hands or feet; seizures  unusual bleeding or bruising  unusually weak or tired Side effects that usually do not require medical attention (report to your doctor or health care professional if they continue or are bothersome):  constipation  diarrhea  headache  joint pain  loss of appetite  muscle pain  runny nose  tiredness  upset stomach This list may not describe all possible side effects. Call your doctor for medical advice about side effects. You may report side effects to FDA at 1-800-FDA-1088. Where should I keep my medicine? This medicine is only given in a clinic, doctor's office, or other health care setting and will not be stored at home. NOTE: This sheet is a summary. It may not cover all possible information. If you have questions about this medicine, talk to your doctor, pharmacist, or health care provider.  2021 Elsevier/Gold Standard (2017-09-20 16:10:44)

## 2020-07-02 LAB — PROSTATE-SPECIFIC AG, SERUM (LABCORP): Prostate Specific Ag, Serum: <0.1 ng/mL (ref 0.0–4.0)

## 2020-07-04 ENCOUNTER — Telehealth: Payer: Self-pay

## 2020-07-04 NOTE — Telephone Encounter (Signed)
Called patient to make him aware of PSA result. Patient verbalized understanding.

## 2020-07-04 NOTE — Telephone Encounter (Signed)
-----   Message from [name], MD sent at 07/04/2020  8:30 AM EST ----- Please let him know his PSA is low

## 2020-07-11 ENCOUNTER — Telehealth: Payer: Self-pay | Admitting: Oncology

## 2020-07-11 MED FILL — XTANDI 40 MG TABS: 40 | 30 days supply | Qty: 120 | Fill #1

## 2020-07-11 NOTE — Telephone Encounter (Signed)
R/s appt per 2/10 sch msg - pt is aware of new apt on 4/8

## 2020-07-12 ENCOUNTER — Ambulatory Visit: Payer: HMO | Admitting: Family Medicine

## 2020-07-29 ENCOUNTER — Telehealth: Payer: Self-pay

## 2020-07-29 NOTE — Telephone Encounter (Signed)
Need a script for cpap and supplies . Thanks [employer]

## 2020-07-29 NOTE — Telephone Encounter (Signed)
Axed over new order for cpap and supplies 07/30/14 [facility]

## 2020-08-12 ENCOUNTER — Telehealth: Payer: Self-pay

## 2020-08-12 NOTE — Telephone Encounter (Signed)
Oral Oncology [facility] Encounter   Was successful in securing patient a [phone] grant from [employer] (PAF) to provide copayment coverage for Xtandi.  This will keep the out of pocket expense at $0.     I have spoken with the patient.    The billing information is as follows and has been shared with [facility]: [id number] PCN:  [id number] Member ID: [id number] Group ID: [id number] Dates of Eligibility: 08/12/20 through 08/12/21  [place] [facility] [place] Phone [phone] Fax [phone] 08/12/2020 2:02 PM

## 2020-08-26 ENCOUNTER — Other Ambulatory Visit (HOSPITAL_COMMUNITY): Payer: Self-pay

## 2020-08-26 ENCOUNTER — Ambulatory Visit: Payer: HMO | Admitting: Oncology

## 2020-08-26 ENCOUNTER — Ambulatory Visit: Payer: HMO

## 2020-08-26 ENCOUNTER — Other Ambulatory Visit: Payer: HMO

## 2020-09-02 ENCOUNTER — Inpatient Hospital Stay: Payer: HMO | Attending: Oncology

## 2020-09-02 ENCOUNTER — Other Ambulatory Visit: Payer: Self-pay

## 2020-09-02 ENCOUNTER — Inpatient Hospital Stay: Payer: HMO

## 2020-09-02 ENCOUNTER — Inpatient Hospital Stay: Payer: HMO | Admitting: Oncology

## 2020-09-02 VITALS — BP 130/74 | HR 84 | Temp 97.6°F | Resp 18 | Wt 220.5 lb

## 2020-09-02 DIAGNOSIS — C7951 Secondary malignant neoplasm of bone: Secondary | ICD-10-CM | POA: Diagnosis not present

## 2020-09-02 DIAGNOSIS — C61 Malignant neoplasm of prostate: Secondary | ICD-10-CM | POA: Insufficient documentation

## 2020-09-02 DIAGNOSIS — Z5111 Encounter for antineoplastic chemotherapy: Secondary | ICD-10-CM | POA: Diagnosis not present

## 2020-09-02 DIAGNOSIS — Z79899 Other long term (current) drug therapy: Secondary | ICD-10-CM | POA: Insufficient documentation

## 2020-09-02 DIAGNOSIS — Z923 Personal history of irradiation: Secondary | ICD-10-CM | POA: Diagnosis not present

## 2020-09-02 DIAGNOSIS — M549 Dorsalgia, unspecified: Secondary | ICD-10-CM | POA: Insufficient documentation

## 2020-09-02 LAB — CMP (CANCER CENTER ONLY)
ALT: 18 U/L (ref 0–44)
AST: 15 U/L (ref 15–41)
Albumin: 3.8 g/dL (ref 3.5–5.0)
Alkaline Phosphatase: 49 U/L (ref 38–126)
Anion gap: 15 (ref 5–15)
BUN: 13 mg/dL (ref 8–23)
CO2: 21 mmol/L — ABNORMAL LOW (ref 22–32)
Calcium: 8.9 mg/dL (ref 8.9–10.3)
Chloride: 104 mmol/L (ref 98–111)
Creatinine: 0.76 mg/dL (ref 0.61–1.24)
GFR, Estimated: >60 mL/min (ref >60)
Glucose, Bld: 231 mg/dL — ABNORMAL HIGH (ref 70–99)
Potassium: 4.2 mmol/L (ref 3.5–5.1)
Sodium: 140 mmol/L (ref 135–145)
Total Bilirubin: 0.4 mg/dL (ref 0.3–1.2)
Total Protein: 7 g/dL (ref 6.5–8.1)

## 2020-09-02 LAB — CBC WITH DIFFERENTIAL (CANCER CENTER ONLY)
Abs Immature Granulocytes: 0.02 10*3/uL (ref 0.00–0.07)
Basophils Absolute: 0 10*3/uL (ref 0.0–0.1)
Basophils Relative: 1 %
Eosinophils Absolute: 0 10*3/uL (ref 0.0–0.5)
Eosinophils Relative: 1 %
HCT: 42.6 % (ref 39.0–52.0)
Hemoglobin: 14.6 g/dL (ref 13.0–17.0)
Immature Granulocytes: 1 %
Lymphocytes Relative: 15 %
Lymphs Abs: 0.6 10*3/uL — ABNORMAL LOW (ref 0.7–4.0)
MCH: 31.6 pg (ref 26.0–34.0)
MCHC: 34.3 g/dL (ref 30.0–36.0)
MCV: 92.2 fL (ref 80.0–100.0)
Monocytes Absolute: 0.3 10*3/uL (ref 0.1–1.0)
Monocytes Relative: 8 %
Neutro Abs: 3 10*3/uL (ref 1.7–7.7)
Neutrophils Relative %: 74 %
Platelet Count: 233 10*3/uL (ref 150–400)
RBC: 4.62 MIL/uL (ref 4.22–5.81)
RDW: 13.9 % (ref 11.5–15.5)
WBC Count: 4 10*3/uL (ref 4.0–10.5)
nRBC: 0 % (ref 0.0–0.2)

## 2020-09-02 MED ORDER — LEUPROLIDE ACETATE (4 MONTH) 30 MG ~~LOC~~ KIT
PACK | SUBCUTANEOUS | Status: AC
  Filled 2020-09-02: qty 30

## 2020-09-02 MED ORDER — LEUPROLIDE ACETATE (4 MONTH) 30 MG ~~LOC~~ KIT
30.0000 mg | PACK | Freq: Once | SUBCUTANEOUS | Status: AC
  Administered 2020-09-02: 30 mg via SUBCUTANEOUS

## 2020-09-02 MED ORDER — DENOSUMAB 120 MG/1.7ML ~~LOC~~ SOLN
120.0000 mg | Freq: Once | SUBCUTANEOUS | Status: AC
  Administered 2020-09-02: 120 mg via SUBCUTANEOUS

## 2020-09-02 MED ORDER — DENOSUMAB 120 MG/1.7ML ~~LOC~~ SOLN
SUBCUTANEOUS | Status: AC
  Filled 2020-09-02: qty 1.7

## 2020-09-02 NOTE — Patient Instructions (Signed)
Denosumab injection What is this medicine? DENOSUMAB (den oh sue mab) slows bone breakdown. Prolia is used to treat osteoporosis in women after menopause and in men, and in people who are taking corticosteroids for 6 months or more. Xgeva is used to treat a high calcium level due to cancer and to prevent bone fractures and other bone problems caused by multiple myeloma or cancer bone metastases. Xgeva is also used to treat giant cell tumor of the bone. This medicine may be used for other purposes; ask your health care provider or pharmacist if you have questions. COMMON BRAND NAME(S): Prolia, XGEVA What should I tell my health care provider before I take this medicine? They need to know if you have any of these conditions:  dental disease  having surgery or tooth extraction  infection  kidney disease  low levels of calcium or Vitamin D in the blood  malnutrition  on hemodialysis  skin conditions or sensitivity  thyroid or parathyroid disease  an unusual reaction to denosumab, other medicines, foods, dyes, or preservatives  pregnant or trying to get pregnant  breast-feeding How should I use this medicine? This medicine is for injection under the skin. It is given by a health care professional in a hospital or clinic setting. A special MedGuide will be given to you before each treatment. Be sure to read this information carefully each time. For Prolia, talk to your pediatrician regarding the use of this medicine in children. Special care may be needed. For Xgeva, talk to your pediatrician regarding the use of this medicine in children. While this drug may be prescribed for children as young as 13 years for selected conditions, precautions do apply. Overdosage: If you think you have taken too much of this medicine contact a poison control center or emergency room at once. NOTE: This medicine is only for you. Do not share this medicine with others. What if I miss a dose? It is  important not to miss your dose. Call your doctor or health care professional if you are unable to keep an appointment. What may interact with this medicine? Do not take this medicine with any of the following medications:  other medicines containing denosumab This medicine may also interact with the following medications:  medicines that lower your chance of fighting infection  steroid medicines like prednisone or cortisone This list may not describe all possible interactions. Give your health care provider a list of all the medicines, herbs, non-prescription drugs, or dietary supplements you use. Also tell them if you smoke, drink alcohol, or use illegal drugs. Some items may interact with your medicine. What should I watch for while using this medicine? Visit your doctor or health care professional for regular checks on your progress. Your doctor or health care professional may order blood tests and other tests to see how you are doing. Call your doctor or health care professional for advice if you get a fever, chills or sore throat, or other symptoms of a cold or flu. Do not treat yourself. This drug may decrease your body's ability to fight infection. Try to avoid being around people who are sick. You should make sure you get enough calcium and vitamin D while you are taking this medicine, unless your doctor tells you not to. Discuss the foods you eat and the vitamins you take with your health care professional. See your dentist regularly. Brush and floss your teeth as directed. Before you have any dental work done, tell your dentist you are   receiving this medicine. Do not become pregnant while taking this medicine or for 5 months after stopping it. Talk with your doctor or health care professional about your birth control options while taking this medicine. Women should inform their doctor if they wish to become pregnant or think they might be pregnant. There is a potential for serious side  effects to an unborn child. Talk to your health care professional or pharmacist for more information. What side effects may I notice from receiving this medicine? Side effects that you should report to your doctor or health care professional as soon as possible:  allergic reactions like skin rash, itching or hives, swelling of the face, lips, or tongue  bone pain  breathing problems  dizziness  jaw pain, especially after dental work  redness, blistering, peeling of the skin  signs and symptoms of infection like fever or chills; cough; sore throat; pain or trouble passing urine  signs of low calcium like fast heartbeat, muscle cramps or muscle pain; pain, tingling, numbness in the hands or feet; seizures  unusual bleeding or bruising  unusually weak or tired Side effects that usually do not require medical attention (report to your doctor or health care professional if they continue or are bothersome):  constipation  diarrhea  headache  joint pain  loss of appetite  muscle pain  runny nose  tiredness  upset stomach This list may not describe all possible side effects. Call your doctor for medical advice about side effects. You may report side effects to FDA at 1-800-FDA-1088. Where should I keep my medicine? This medicine is only given in a clinic, doctor's office, or other health care setting and will not be stored at home. NOTE: This sheet is a summary. It may not cover all possible information. If you have questions about this medicine, talk to your doctor, pharmacist, or health care provider.  2021 Elsevier/Gold Standard (2017-09-20 16:10:44) Leuprolide depot injection What is this medicine? LEUPROLIDE (loo PROE lide) is a man-made protein that acts like a natural hormone in the body. It decreases testosterone in men and decreases estrogen in women. In men, this medicine is used to treat advanced prostate cancer. In women, some forms of this medicine may be used  to treat endometriosis, uterine fibroids, or other male hormone-related problems. This medicine may be used for other purposes; ask your health care provider or pharmacist if you have questions. COMMON BRAND NAME(S): Eligard, Fensolv, Lupron Depot, Lupron Depot-Ped, Viadur What should I tell my health care provider before I take this medicine? They need to know if you have any of these conditions:  diabetes  heart disease or previous heart attack  high blood pressure  high cholesterol  mental illness  osteoporosis  pain or difficulty passing urine  seizures  spinal cord metastasis  stroke  suicidal thoughts, plans, or attempt; a previous suicide attempt by you or a family member  tobacco smoker  unusual vaginal bleeding (women)  an unusual or allergic reaction to leuprolide, benzyl alcohol, other medicines, foods, dyes, or preservatives  pregnant or trying to get pregnant  breast-feeding How should I use this medicine? This medicine is for injection into a muscle or for injection under the skin. It is given by a health care professional in a hospital or clinic setting. The specific product will determine how it will be given to you. Make sure you understand which product you receive and how often you will receive it. Talk to your pediatrician regarding the use of  this medicine in children. Special care may be needed. Overdosage: If you think you have taken too much of this medicine contact a poison control center or emergency room at once. NOTE: This medicine is only for you. Do not share this medicine with others. What if I miss a dose? It is important not to miss a dose. Call your doctor or health care professional if you are unable to keep an appointment. Depot injections: Depot injections are given either once-monthly, every 12 weeks, every 16 weeks, or every 24 weeks depending on the product you are prescribed. The product you are prescribed will be based on if you  are male or male, and your condition. Make sure you understand your product and dosing. What may interact with this medicine? Do not take this medicine with any of the following medications:  chasteberry  cisapride  dronedarone  pimozide  thioridazine This medicine may also interact with the following medications:  herbal or dietary supplements, like black cohosh or DHEA  male hormones, like estrogens or progestins and birth control pills, patches, rings, or injections  male hormones, like testosterone  other medicines that prolong the QT interval (abnormal heart rhythm) This list may not describe all possible interactions. Give your health care provider a list of all the medicines, herbs, non-prescription drugs, or dietary supplements you use. Also tell them if you smoke, drink alcohol, or use illegal drugs. Some items may interact with your medicine. What should I watch for while using this medicine? Visit your doctor or health care professional for regular checks on your progress. During the first weeks of treatment, your symptoms may get worse, but then will improve as you continue your treatment. You may get hot flashes, increased bone pain, increased difficulty passing urine, or an aggravation of nerve symptoms. Discuss these effects with your doctor or health care professional, some of them may improve with continued use of this medicine. Male patients may experience a menstrual cycle or spotting during the first months of therapy with this medicine. If this continues, contact your doctor or health care professional. This medicine may increase blood sugar. Ask your healthcare provider if changes in diet or medicines are needed if you have diabetes. What side effects may I notice from receiving this medicine? Side effects that you should report to your doctor or health care professional as soon as possible:  allergic reactions like skin rash, itching or hives, swelling of  the face, lips, or tongue  breathing problems  chest pain  depression or memory disorders  pain in your legs or groin  pain at site where injected or implanted  seizures  severe headache  signs and symptoms of high blood sugar such as being more thirsty or hungry or having to urinate more than normal. You may also feel very tired or have blurry vision  swelling of the feet and legs  suicidal thoughts or other mood changes  visual changes  vomiting Side effects that usually do not require medical attention (report to your doctor or health care professional if they continue or are bothersome):  breast swelling or tenderness  decrease in sex drive or performance  diarrhea  hot flashes  loss of appetite  muscle, joint, or bone pains  nausea  redness or irritation at site where injected or implanted  skin problems or acne This list may not describe all possible side effects. Call your doctor for medical advice about side effects. You may report side effects to FDA at 1-800-FDA-1088.  Where should I keep my medicine? This drug is given in a hospital or clinic and will not be stored at home. NOTE: This sheet is a summary. It may not cover all possible information. If you have questions about this medicine, talk to your doctor, pharmacist, or health care provider.  2021 Elsevier/Gold Standard (2019-04-15 10:35:13)

## 2020-09-02 NOTE — Progress Notes (Signed)
Hematology and Oncology Follow Up   Wesley White [id number] 10/04/1950 70 y.o. 09/02/2020 11:30 AM Wesley White, MDLalonde, Wesley Jarvis, MD     Principle Diagnosis: 70 year old man with advanced prostate cancer with disease to the bone diagnosed in 2020.  He has castration-resistant after initial presentation in 2017 with PSA was 10.4 and a Gleason score 4+3 = 7.    Prior Therapy:   He is status post laparoscopic radical prostatectomy and pelvic lymphadenectomy on March 05, 2016.  The final pathology showed Gleason score 4+3 equal 7 with 0 out of 9 lymph node involved.  He did have extraprostatic extension at that time.   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  He is status post radiation therapy adjuvantly under the care of Dr. Tammi Klippel completed on May 4 of 2018.  He received 68.4 Gray in 38 fractions.  Status post radiation therapy for isolated left iliac bone metastasis completed in June 2020.  Zytiga 1000 mg daily started in August 2020.  Therapy discontinued because of lack of coverage   He developed worsening back pain and a rise in his PSA up to 2.7 with MRI showed worsening disease in the spine including T12, T1 and T5.  He is status post radiation therapy to the T11 and L4 completed on February 04, 2020 after completing 30 Gray in 10 fractions.   Current therapy:   Eligard 30 mg every 4 months.  He will receive Eligard today and repeated in 4 months.  Xgeva 120 mg every 2 months.   Xtandi 160 mg daily started on January 20, 2020.   Interim History: [name] returns today for repeat evaluation.  Since the last visit, he reports feeling well without any new complaints.  He has tolerated Xtandi without any nausea, fatigue or edema.  Denies any bone pain or pathological fractures.  He denies any recent falls or syncope.  Continues to be active without any decline in his performance status and quality of life.               Medications:  Reviewed without changes. Current Outpatient Medications  Medication Sig Dispense Refill  . acetaminophen (TYLENOL) 325 MG tablet Take 650 mg by mouth every 6 (six) hours as needed for mild pain.     [name] CIALIS 5 MG tablet Take 5 mg by mouth daily. (Patient not taking: Reported on 01/12/2020)  11  . enzalutamide (XTANDI) 40 MG tablet TAKE 4 TABLETS (160 MG TOTAL) BY MOUTH DAILY. 120 tablet 3  . HYDROcodone-acetaminophen (NORCO) 5-325 MG tablet Take 1 tablet by mouth every 6 (six) hours as needed for moderate pain. (Patient not taking: Reported on 03/21/2020) 15 tablet 0  . ibuprofen (ADVIL) 600 MG tablet Take 600 mg by mouth every 6 (six) hours as needed. (Patient not taking: Reported on 03/08/2020)    . INVOKANA 300 MG TABS tablet TAKE 1 TABLET BY MOUTH EVERY DAY BEFORE BREAKFAST 90 tablet 1  . lisinopril (ZESTRIL) 5 MG tablet TAKE 1 TABLET BY MOUTH EVERY DAY 90 tablet 3  . metFORMIN (GLUCOPHAGE) 500 MG tablet TAKE 1 TABLET BY MOUTH TWICE A DAY WITH A MEAL 180 tablet 1  . naproxen sodium (ALEVE) 220 MG tablet Take 220 mg by mouth daily as needed.     [name] Delica Lancets 27O MISC See admin instructions.    [name] VERIO test strip USE AS DIRECTED AS NEEDED 100 strip 11  . oxyCODONE-acetaminophen (PERCOCET/ROXICET) 5-325 MG tablet Take  1-2 tablets by mouth every 6 (six) hours as needed for moderate pain or severe pain. (Patient not taking: Reported on 03/08/2020) 60 tablet 0  . rosuvastatin (CRESTOR) 20 MG tablet TAKE 1 TABLET BY MOUTH EVERY DAY 90 tablet 3   No current facility-administered medications for this visit.   Facility-Administered Medications Ordered in Other Visits  Medication Dose Route Frequency Provider Last Rate Last Admin  . magnesium citrate solution 1 Bottle  1 Bottle Oral Once Raynelle Bring, MD      . sodium phosphate (FLEET) 7-19 GM/118ML enema 1 enema  1 enema Rectal Once Raynelle Bring, MD           Physical Exam:    Blood pressure 130/74, pulse 84,  temperature 97.6 F (36.4 C), temperature source Tympanic, resp. rate 18, weight 220 lb 8 oz (100 kg), SpO2 96 %.      ECOG: 1   General appearance: Alert, awake without any distress. Head: Atraumatic without abnormalities Oropharynx: Without any thrush or ulcers. Eyes: No scleral icterus. Lymph nodes: No lymphadenopathy noted in the cervical, supraclavicular, or axillary nodes Heart:regular rate and rhythm, without any murmurs or gallops.   White: Clear to auscultation without any rhonchi, wheezes or dullness to percussion. Abdomin: Soft, nontender without any shifting dullness or ascites. Musculoskeletal: No clubbing or cyanosis. Neurological: No motor or sensory deficits. Skin: No rashes or lesions.               Lab Results: Lab Results  Component Value Date   WBC 4.0 07/01/2020   HGB 14.2 07/01/2020   HCT 41.9 07/01/2020   MCV 91.1 07/01/2020   PLT 228 07/01/2020     Chemistry      Component Value Date/Time   NA 136 07/01/2020 0829   NA 136 10/17/2018 1139   K 4.5 07/01/2020 0829   CL 103 07/01/2020 0829   CO2 23 07/01/2020 0829   BUN 16 07/01/2020 0829   BUN 11 10/17/2018 1139   CREATININE 0.74 07/01/2020 0829   CREATININE 0.81 06/08/2015 0001      Component Value Date/Time   CALCIUM 9.5 07/01/2020 0829   ALKPHOS 53 07/01/2020 0829   AST 13 (L) 07/01/2020 0829   ALT 15 07/01/2020 0829   BILITOT 0.5 07/01/2020 0829        Results for Wesley White, Wesley White (MRN [id number]) as of 09/02/2020 11:32  Ref. Range 03/11/2020 11:28 05/06/2020 10:44 07/01/2020 08:29  Prostate Specific Ag, Serum Latest Ref Range: 0.0 - 4.0 ng/mL 0.5 0.2 <0.1        Impression and Plan:   70 year old man with:  1.  Advanced prostate cancer with disease to the bone diagnosed in June 2020.  He has castration-resistant at this time.  He continues to tolerate Xtandi without any major complications.  His PSA continues to show excellent response and currently undetectable.   Risks and benefits of continuing this treatment were discussed at this time.  Complications including hypertension, fatigue and rarely seizures were reviewed.  Alternative treatment options include systemic chemotherapy were reiterated.  He is agreeable to continue.  We will update his staging scans before the next visit.     2.  Back pain: Related to prostate cancer metastasis to the bone.  His pain has improved since definitive treatment.  3.  Androgen deprivation: He will receive Eligard today and repeated in 4 months.  Complication clinic weight gain, hot flashes were reiterated.   4.  Bone directed therapy: He is currently on [place]  without any issues.  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   5.  Prognosis and goals of care: His disease is incurable although aggressive measures are warranted at this time.  6.  Follow-up: He will return in 2 months for repeat evaluation and Xgeva.  30  minutes were dedicated to this visit.  Time was spent on reviewing laboratory data, disease status update and outlining future plan of care.   Zola Button, MD 09/02/2020 11:30 AM

## 2020-09-03 LAB — PROSTATE-SPECIFIC AG, SERUM (LABCORP): Prostate Specific Ag, Serum: <0.1 ng/mL (ref 0.0–4.0)

## 2020-09-05 ENCOUNTER — Telehealth: Payer: Self-pay

## 2020-09-05 NOTE — Telephone Encounter (Signed)
-----   Message from Wesley Portela, MD sent at 09/05/2020  7:56 AM EDT ----- Please let him know his PSA is still low

## 2020-09-05 NOTE — Telephone Encounter (Signed)
-----   Message from [name], MD sent at 09/05/2020  7:56 AM EDT ----- Please let him know his PSA is still low

## 2020-09-05 NOTE — Telephone Encounter (Signed)
Attempted to contact patient regarding test results per Dr. Hazeline Junker request. Unable to leave patient voicemail or call back number d/t voicemail not being set up. Will attempt to contact patient again at a later time.

## 2020-09-05 NOTE — Telephone Encounter (Signed)
Able to contact patient regarding PSA lab results. Patient verbalized an understanding and expressed appreciation for the call.

## 2020-09-07 ENCOUNTER — Other Ambulatory Visit: Payer: Self-pay

## 2020-09-07 ENCOUNTER — Ambulatory Visit (INDEPENDENT_AMBULATORY_CARE_PROVIDER_SITE_OTHER): Payer: HMO

## 2020-09-07 ENCOUNTER — Other Ambulatory Visit (HOSPITAL_COMMUNITY): Payer: Self-pay

## 2020-09-07 DIAGNOSIS — Z23 Encounter for immunization: Secondary | ICD-10-CM | POA: Diagnosis not present

## 2020-09-07 MED FILL — Enzalutamide Tab 40 MG: ORAL | 30 days supply | Qty: 120 | Fill #0 | Status: AC

## 2020-09-08 ENCOUNTER — Telehealth: Payer: Self-pay

## 2020-09-08 NOTE — Telephone Encounter (Signed)
LVM for pt to call back and schedule a appt for cpap. Pt will have to start over. [place]

## 2020-09-12 ENCOUNTER — Other Ambulatory Visit (HOSPITAL_COMMUNITY): Payer: Self-pay

## 2020-09-15 ENCOUNTER — Ambulatory Visit (INDEPENDENT_AMBULATORY_CARE_PROVIDER_SITE_OTHER): Payer: HMO | Admitting: Family Medicine

## 2020-09-15 ENCOUNTER — Encounter: Payer: Self-pay | Admitting: Family Medicine

## 2020-09-15 ENCOUNTER — Other Ambulatory Visit: Payer: Self-pay

## 2020-09-15 VITALS — BP 112/70 | HR 85 | Temp 96.7°F | Ht 67.5 in | Wt 218.0 lb

## 2020-09-15 DIAGNOSIS — E785 Hyperlipidemia, unspecified: Secondary | ICD-10-CM | POA: Diagnosis not present

## 2020-09-15 DIAGNOSIS — E119 Type 2 diabetes mellitus without complications: Secondary | ICD-10-CM

## 2020-09-15 DIAGNOSIS — E1169 Type 2 diabetes mellitus with other specified complication: Secondary | ICD-10-CM

## 2020-09-15 DIAGNOSIS — E669 Obesity, unspecified: Secondary | ICD-10-CM

## 2020-09-15 DIAGNOSIS — G4733 Obstructive sleep apnea (adult) (pediatric): Secondary | ICD-10-CM

## 2020-09-15 DIAGNOSIS — Z9989 Dependence on other enabling machines and devices: Secondary | ICD-10-CM

## 2020-09-15 DIAGNOSIS — Z8546 Personal history of malignant neoplasm of prostate: Secondary | ICD-10-CM

## 2020-09-15 LAB — POCT UA - MICROALBUMIN
Albumin/Creatinine Ratio, Urine, POC: <14.5
Creatinine, POC: 34.4 mg/dL
Microalbumin Ur, POC: <5 mg/L

## 2020-09-15 LAB — POCT GLYCOSYLATED HEMOGLOBIN (HGB A1C): Hemoglobin A1C: 7.1 % — AB (ref 4.0–5.6)

## 2020-09-15 NOTE — Progress Notes (Signed)
  Subjective:    Patient ID: Wesley White, male    DOB: April 01, 1951, 70 y.o.   MRN: [id number]  Wesley White is a 70 y.o. male who presents for follow-up of Type 2 diabetes mellitus.  Home blood sugar records: meter records, post meal, 160 avg Current symptoms/problems include none at this time. Daily foot checks:yes   Any foot concerns: none Exercise: walking Diet: good He does have a history of metastatic prostate cancer.  He has had radiation and is now relieved with his bone pain.  He is now on Xtandi which does make him fatigued.  He continues on metformin and Invokana and does quite well on that.  He takes low-dose lisinopril.  He and his wife are getting ready to go on a trip to the [place].  He does have underlying OSA however needs a new sleep study.  Apparently the DME company cannot find his original studies. The following portions of the patient's history were reviewed and updated as appropriate: allergies, current medications, past medical history, past social history and problem list.  ROS as in subjective above.     Objective:    Physical Exam Alert and in no distress otherwise not examined. Hemoglobin A1c is 7.1  Lab Review Diabetic Labs Latest Ref Rng & Units 09/02/2020 07/01/2020 05/06/2020 03/11/2020 01/12/2020  HbA1c 4.0 - 5.6 % - - - - 7.1(A)  Microalbumin mg/L - - - - -  Micro/Creat Ratio - - - - - -  Chol 100 - 199 mg/dL - - - - 177  HDL >39 mg/dL - - - - 64  Calc LDL 0 - 99 mg/dL - - - - 89  Triglycerides 0 - 149 mg/dL - - - - 136  Creatinine 0.61 - 1.24 mg/dL 0.76 0.74 0.77 0.81 -   BP/Weight 09/15/2020 09/02/2020 07/01/2020 07/01/2020 75/17/0017  Systolic BP 494 496 759 163 94  Diastolic BP 70 74 88 80 76  Wt. (Lbs) 218 220.5 - 215.5 210.03  BMI 33.64 34.54 - 33.75 32.89   Foot/eye exam completion dates Latest Ref Rng & Units 01/12/2020 02/17/2019  Eye Exam No Retinopathy - No Retinopathy  Foot Form Completion - Done -    Wesley White  reports that he has never  smoked. He has never used smokeless tobacco. He reports current alcohol use. He reports that he does not use drugs.     Assessment & Plan:    Controlled type 2 diabetes mellitus without complication, without long-term current use of insulin (HCC) - Plan: POCT glycosylated hemoglobin (Hb A1C), POCT UA - Microalbumin  Obesity (BMI 30.0-34.9)  OSA on CPAP  History of prostate cancer  Hyperlipidemia associated with type 2 diabetes mellitus ([place])  1. Rx changes: none 2. Education: Reviewed 'ABCs' of diabetes management (respective goals in parentheses):  A1C (<7), blood pressure (<130/80), and cholesterol (LDL <100). 3. Compliance at present is estimated to be good. Efforts to improve compliance (if necessary) will be directed at No change.. 4. Follow up: 6 months He seems be doing quite nicely on his present regimen.

## 2020-09-16 LAB — LIPID PANEL
Chol/HDL Ratio: 3.3 ratio (ref 0.0–5.0)
Cholesterol, Total: 195 mg/dL (ref 100–199)
HDL: 59 mg/dL (ref >39)
LDL Chol Calc (NIH): 98 mg/dL (ref 0–99)
Triglycerides: 223 mg/dL — ABNORMAL HIGH (ref 0–149)
VLDL Cholesterol Cal: 38 mg/dL (ref 5–40)

## 2020-09-16 MED ORDER — ROSUVASTATIN CALCIUM 40 MG PO TABS: 40.0000 mg | ORAL_TABLET | Freq: Every day | ORAL | 3 refills | Status: DC

## 2020-09-16 NOTE — Addendum Note (Signed)
Addended by: [name] on: 09/16/2020 08:23 AM   Modules accepted: Orders

## 2020-10-04 ENCOUNTER — Encounter: Payer: Self-pay | Admitting: Family Medicine

## 2020-10-05 ENCOUNTER — Other Ambulatory Visit: Payer: Self-pay | Admitting: Oncology

## 2020-10-05 ENCOUNTER — Other Ambulatory Visit (HOSPITAL_COMMUNITY): Payer: Self-pay

## 2020-10-05 MED ORDER — ENZALUTAMIDE 40 MG PO TABS
ORAL_TABLET | ORAL | 3 refills | Status: DC
  Filled 2020-10-05: qty 120, 30d supply, fill #0
  Filled 2020-11-16: qty 120, 30d supply, fill #1
  Filled 2020-12-12: qty 120, 30d supply, fill #2
  Filled 2020-12-28: qty 120, 30d supply, fill #3

## 2020-10-13 ENCOUNTER — Other Ambulatory Visit (HOSPITAL_COMMUNITY): Payer: Self-pay

## 2020-10-19 ENCOUNTER — Other Ambulatory Visit: Payer: Self-pay | Admitting: Family Medicine

## 2020-10-19 DIAGNOSIS — L309 Dermatitis, unspecified: Secondary | ICD-10-CM | POA: Diagnosis not present

## 2020-10-19 DIAGNOSIS — E119 Type 2 diabetes mellitus without complications: Secondary | ICD-10-CM

## 2020-10-25 ENCOUNTER — Ambulatory Visit (INDEPENDENT_AMBULATORY_CARE_PROVIDER_SITE_OTHER): Payer: HMO | Admitting: Family Medicine

## 2020-10-25 ENCOUNTER — Encounter: Payer: Self-pay | Admitting: Family Medicine

## 2020-10-25 ENCOUNTER — Other Ambulatory Visit: Payer: Self-pay

## 2020-10-25 VITALS — BP 120/70 | HR 92 | Temp 96.4°F | Wt 219.4 lb

## 2020-10-25 DIAGNOSIS — J029 Acute pharyngitis, unspecified: Secondary | ICD-10-CM | POA: Diagnosis not present

## 2020-10-25 LAB — POCT RAPID STREP A (OFFICE): Rapid Strep A Screen: NEGATIVE

## 2020-10-25 NOTE — Progress Notes (Signed)
   Subjective:    Patient ID: Wesley White, male    DOB: 1950/12/02, 70 y.o.   MRN: [id number]  HPI He complains of a 1 day history of sore throat with some slight postnasal drainage but no fever, chills, cough, congestion, smell or taste changes.   Review of Systems     Objective:   Physical Exam Alert and in no distress. Tympanic membranes and canals are normal. Pharyngeal area is normal. Neck is supple without adenopathy or thyromegaly. Cardiac exam shows a regular sinus rhythm without murmurs or gallops. Lungs are clear to auscultation.        Assessment & Plan:  Sore throat - Plan: Rapid Strep A Strep test is negative.  Recommend supportive care.  He was comfortable with that.

## 2020-10-26 ENCOUNTER — Ambulatory Visit (HOSPITAL_COMMUNITY)
Admission: RE | Admit: 2020-10-26 | Discharge: 2020-10-26 | Disposition: A | Discharge status: Home / Self Care | Payer: HMO | Source: Ambulatory Visit | Attending: Oncology | Admitting: Oncology

## 2020-10-26 ENCOUNTER — Encounter (HOSPITAL_COMMUNITY)
Admission: RE | Admit: 2020-10-26 | Discharge: 2020-10-26 | Disposition: A | Discharge status: Home / Self Care | Payer: HMO | Source: Ambulatory Visit | Attending: Oncology | Admitting: Oncology

## 2020-10-26 ENCOUNTER — Inpatient Hospital Stay: Payer: HMO | Attending: Oncology

## 2020-10-26 DIAGNOSIS — Z79899 Other long term (current) drug therapy: Secondary | ICD-10-CM | POA: Insufficient documentation

## 2020-10-26 DIAGNOSIS — C7951 Secondary malignant neoplasm of bone: Secondary | ICD-10-CM | POA: Insufficient documentation

## 2020-10-26 DIAGNOSIS — M549 Dorsalgia, unspecified: Secondary | ICD-10-CM | POA: Insufficient documentation

## 2020-10-26 DIAGNOSIS — C61 Malignant neoplasm of prostate: Secondary | ICD-10-CM | POA: Diagnosis not present

## 2020-10-26 DIAGNOSIS — R5383 Other fatigue: Secondary | ICD-10-CM | POA: Diagnosis not present

## 2020-10-26 DIAGNOSIS — Z923 Personal history of irradiation: Secondary | ICD-10-CM | POA: Insufficient documentation

## 2020-10-26 DIAGNOSIS — Z5111 Encounter for antineoplastic chemotherapy: Secondary | ICD-10-CM | POA: Diagnosis present

## 2020-10-26 LAB — CBC WITH DIFFERENTIAL (CANCER CENTER ONLY)
Abs Immature Granulocytes: 0.01 10*3/uL (ref 0.00–0.07)
Basophils Absolute: 0 10*3/uL (ref 0.0–0.1)
Basophils Relative: 1 %
Eosinophils Absolute: 0.1 10*3/uL (ref 0.0–0.5)
Eosinophils Relative: 2 %
HCT: 42.6 % (ref 39.0–52.0)
Hemoglobin: 14.2 g/dL (ref 13.0–17.0)
Immature Granulocytes: 0 %
Lymphocytes Relative: 14 %
Lymphs Abs: 0.5 10*3/uL — ABNORMAL LOW (ref 0.7–4.0)
MCH: 31.3 pg (ref 26.0–34.0)
MCHC: 33.3 g/dL (ref 30.0–36.0)
MCV: 93.8 fL (ref 80.0–100.0)
Monocytes Absolute: 0.5 10*3/uL (ref 0.1–1.0)
Monocytes Relative: 12 %
Neutro Abs: 2.8 10*3/uL (ref 1.7–7.7)
Neutrophils Relative %: 71 %
Platelet Count: 200 10*3/uL (ref 150–400)
RBC: 4.54 MIL/uL (ref 4.22–5.81)
RDW: 13.5 % (ref 11.5–15.5)
WBC Count: 3.9 10*3/uL — ABNORMAL LOW (ref 4.0–10.5)
nRBC: 0 % (ref 0.0–0.2)

## 2020-10-26 LAB — CMP (CANCER CENTER ONLY)
ALT: 16 U/L (ref 0–44)
AST: 14 U/L — ABNORMAL LOW (ref 15–41)
Albumin: 3.6 g/dL (ref 3.5–5.0)
Alkaline Phosphatase: 57 U/L (ref 38–126)
Anion gap: 14 (ref 5–15)
BUN: 15 mg/dL (ref 8–23)
CO2: 24 mmol/L (ref 22–32)
Calcium: 9.8 mg/dL (ref 8.9–10.3)
Chloride: 102 mmol/L (ref 98–111)
Creatinine: 0.78 mg/dL (ref 0.61–1.24)
GFR, Estimated: >60 mL/min (ref >60)
Glucose, Bld: 248 mg/dL — ABNORMAL HIGH (ref 70–99)
Potassium: 4.3 mmol/L (ref 3.5–5.1)
Sodium: 140 mmol/L (ref 135–145)
Total Bilirubin: 0.5 mg/dL (ref 0.3–1.2)
Total Protein: 7 g/dL (ref 6.5–8.1)

## 2020-10-26 MED ORDER — TECHNETIUM TC 99M MEDRONATE IV KIT
20.0000 | PACK | Freq: Once | INTRAVENOUS | Status: AC | PRN
  Administered 2020-10-26: 21 via INTRAVENOUS

## 2020-10-27 LAB — PROSTATE-SPECIFIC AG, SERUM (LABCORP): Prostate Specific Ag, Serum: <0.1 ng/mL (ref 0.0–4.0)

## 2020-11-01 ENCOUNTER — Other Ambulatory Visit: Payer: Self-pay

## 2020-11-01 ENCOUNTER — Ambulatory Visit (HOSPITAL_COMMUNITY)
Admission: RE | Admit: 2020-11-01 | Discharge: 2020-11-01 | Disposition: A | Discharge status: Home / Self Care | Payer: HMO | Source: Ambulatory Visit | Attending: Oncology | Admitting: Oncology

## 2020-11-01 DIAGNOSIS — I700 Atherosclerosis of aorta: Secondary | ICD-10-CM | POA: Diagnosis not present

## 2020-11-01 DIAGNOSIS — C61 Malignant neoplasm of prostate: Secondary | ICD-10-CM | POA: Insufficient documentation

## 2020-11-01 DIAGNOSIS — K769 Liver disease, unspecified: Secondary | ICD-10-CM | POA: Diagnosis not present

## 2020-11-01 DIAGNOSIS — K4020 Bilateral inguinal hernia, without obstruction or gangrene, not specified as recurrent: Secondary | ICD-10-CM | POA: Diagnosis not present

## 2020-11-01 DIAGNOSIS — C7951 Secondary malignant neoplasm of bone: Secondary | ICD-10-CM | POA: Insufficient documentation

## 2020-11-01 DIAGNOSIS — I313 Pericardial effusion (noninflammatory): Secondary | ICD-10-CM | POA: Diagnosis not present

## 2020-11-01 DIAGNOSIS — G35 Multiple sclerosis: Secondary | ICD-10-CM | POA: Diagnosis not present

## 2020-11-01 MED ORDER — SODIUM CHLORIDE (PF) 0.9 % IJ SOLN
INTRAMUSCULAR | Status: AC
  Filled 2020-11-01: qty 50

## 2020-11-01 MED ORDER — IOHEXOL 300 MG/ML  SOLN
100.0000 mL | Freq: Once | INTRAMUSCULAR | Status: AC | PRN
  Administered 2020-11-01: 100 mL via INTRAVENOUS

## 2020-11-02 ENCOUNTER — Inpatient Hospital Stay: Payer: HMO | Admitting: Oncology

## 2020-11-02 ENCOUNTER — Inpatient Hospital Stay: Payer: HMO

## 2020-11-02 ENCOUNTER — Ambulatory Visit (HOSPITAL_BASED_OUTPATIENT_CLINIC_OR_DEPARTMENT_OTHER): Payer: HMO | Attending: Family Medicine | Admitting: Internal Medicine

## 2020-11-02 VITALS — Ht 67.0 in | Wt 218.0 lb

## 2020-11-02 VITALS — BP 113/76 | HR 90 | Temp 97.1°F | Resp 18 | Wt 216.5 lb

## 2020-11-02 DIAGNOSIS — C61 Malignant neoplasm of prostate: Secondary | ICD-10-CM

## 2020-11-02 DIAGNOSIS — R0902 Hypoxemia: Secondary | ICD-10-CM | POA: Diagnosis not present

## 2020-11-02 DIAGNOSIS — C7951 Secondary malignant neoplasm of bone: Secondary | ICD-10-CM | POA: Diagnosis not present

## 2020-11-02 DIAGNOSIS — Z9989 Dependence on other enabling machines and devices: Secondary | ICD-10-CM | POA: Diagnosis not present

## 2020-11-02 DIAGNOSIS — G4733 Obstructive sleep apnea (adult) (pediatric): Secondary | ICD-10-CM | POA: Insufficient documentation

## 2020-11-02 MED ORDER — DENOSUMAB 120 MG/1.7ML ~~LOC~~ SOLN
120.0000 mg | Freq: Once | SUBCUTANEOUS | Status: AC
  Administered 2020-11-02: 120 mg via SUBCUTANEOUS

## 2020-11-02 MED ORDER — DENOSUMAB 120 MG/1.7ML ~~LOC~~ SOLN
SUBCUTANEOUS | Status: AC
  Filled 2020-11-02: qty 1.7

## 2020-11-02 NOTE — Progress Notes (Signed)
Per [name], ok to proceed with xgeva injection today with labs from 10/26/2020

## 2020-11-02 NOTE — Progress Notes (Signed)
Hematology and Oncology Follow Up   Michaell Grider [id number] Mar 15, 1951 70 y.o. 11/02/2020 8:15 AM [name], MDLalonde, [name], MD     Principle Diagnosis: 70 year old man with castration-resistant advanced prostate cancer with disease to the bone diagnosed in 2020.  He presented with PSA was 10.4 and a Gleason score 4+3 = 7 in 2017.  Prior Therapy:   He is status post laparoscopic radical prostatectomy and pelvic lymphadenectomy on March 05, 2016.  The final pathology showed Gleason score 4+3 equal 7 with 0 out of 9 lymph node involved.  He did have extraprostatic extension at that time.   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  He is status post radiation therapy adjuvantly under the care of Dr. Tammi Klippel completed on May 4 of 2018.  He received 68.4 Gray in 38 fractions.  Status post radiation therapy for isolated left iliac bone metastasis completed in June 2020.  Zytiga 1000 mg daily started in August 2020.  Therapy discontinued because of lack of coverage   He developed worsening back pain and a rise in his PSA up to 2.7 with MRI showed worsening disease in the spine including T12, T1 and T5.  He is status post radiation therapy to the T11 and L4 completed on February 04, 2020 after completing 30 Gray in 10 fractions.   Current therapy:   Eligard 30 mg every 4 months.  Next Eligard will be given in August 2022.  Xgeva 120 mg every 2 months.   Xtandi 160 mg daily started on January 20, 2020.   Interim History: Mr. Klingler is here for a follow-up visit.  Since last visit, he reports no major changes in his health.  He continues to tolerate Xtandi without any issues.  He denies any nausea, fatigue or edema.  He does report some mild tiredness associated with this treatment.  He denies any new bone pain or pathological fractures.  He denies any falls or syncope.  He has been exercising more regularly and of lost intentionally 40  pounds.               Medications: Updated on review. Current Outpatient Medications  Medication Sig Dispense Refill  . acetaminophen (TYLENOL) 325 MG tablet Take 650 mg by mouth every 6 (six) hours as needed for mild pain.     [name] CIALIS 5 MG tablet Take 5 mg by mouth daily. (Patient not taking: No sig reported)  11  . enzalutamide (XTANDI) 40 MG tablet TAKE 4 TABLETS (160 MG TOTAL) BY MOUTH DAILY. 120 tablet 3  . HYDROcodone-acetaminophen (NORCO) 5-325 MG tablet Take 1 tablet by mouth every 6 (six) hours as needed for moderate pain. (Patient not taking: No sig reported) 15 tablet 0  . ibuprofen (ADVIL) 600 MG tablet Take 600 mg by mouth every 6 (six) hours as needed. (Patient not taking: No sig reported)    . INVOKANA 300 MG TABS tablet TAKE 1 TABLET BY MOUTH EVERY DAY BEFORE BREAKFAST 90 tablet 1  . lisinopril (ZESTRIL) 5 MG tablet TAKE 1 TABLET BY MOUTH EVERY DAY 90 tablet 3  . metFORMIN (GLUCOPHAGE) 500 MG tablet TAKE 1 TABLET BY MOUTH TWICE A DAY WITH A MEAL 180 tablet 1  . naproxen sodium (ALEVE) 220 MG tablet Take 220 mg by mouth daily as needed.     [name] Delica Lancets 41P MISC See admin instructions.    [name] VERIO test strip USE AS DIRECTED AS NEEDED 100 strip 11  .  oxyCODONE-acetaminophen (PERCOCET/ROXICET) 5-325 MG tablet Take 1-2 tablets by mouth every 6 (six) hours as needed for moderate pain or severe pain. (Patient not taking: No sig reported) 60 tablet 0  . rosuvastatin (CRESTOR) 40 MG tablet Take 1 tablet (40 mg total) by mouth daily. 90 tablet 3   No current facility-administered medications for this visit.   Facility-Administered Medications Ordered in Other Visits  Medication Dose Route Frequency Provider Last Rate Last Admin  . magnesium citrate solution 1 Bottle  1 Bottle Oral Once Raynelle Bring, MD      . sodium phosphate (FLEET) 7-19 GM/118ML enema 1 enema  1 enema Rectal Once Raynelle Bring, MD           Physical Exam:    Blood  pressure 113/76, pulse 90, temperature (!) 97.1 F (36.2 C), resp. rate 18, weight 216 lb 8 oz (98.2 kg), SpO2 97 %.       ECOG: 1    General appearance: Comfortable appearing without any discomfort Head: Normocephalic without any trauma Oropharynx: Mucous membranes are moist and pink without any thrush or ulcers. Eyes: Pupils are equal and round reactive to light. Lymph nodes: No cervical, supraclavicular, inguinal or axillary lymphadenopathy.   Heart:regular rate and rhythm.  S1 and S2 without leg edema. Lung: Clear without any rhonchi or wheezes.  No dullness to percussion. Abdomin: Soft, nontender, nondistended with good bowel sounds.  No hepatosplenomegaly. Musculoskeletal: No joint deformity or effusion.  Full range of motion noted. Neurological: No deficits noted on motor, sensory and deep tendon reflex exam. Skin: No petechial rash or dryness.  Appeared moist.                 Lab Results: Lab Results  Component Value Date   WBC 3.9 (L) 10/26/2020   HGB 14.2 10/26/2020   HCT 42.6 10/26/2020   MCV 93.8 10/26/2020   PLT 200 10/26/2020     Chemistry      Component Value Date/Time   NA 140 10/26/2020 1000   NA 136 10/17/2018 1139   K 4.3 10/26/2020 1000   CL 102 10/26/2020 1000   CO2 24 10/26/2020 1000   BUN 15 10/26/2020 1000   BUN 11 10/17/2018 1139   CREATININE 0.78 10/26/2020 1000   CREATININE 0.81 06/08/2015 0001      Component Value Date/Time   CALCIUM 9.8 10/26/2020 1000   ALKPHOS 57 10/26/2020 1000   AST 14 (L) 10/26/2020 1000   ALT 16 10/26/2020 1000   BILITOT 0.5 10/26/2020 1000          Results for BERGEN, MAGNER (MRN [id number]) as of 11/02/2020 08:08  Ref. Range 07/01/2020 08:29 09/02/2020 11:16 10/26/2020 10:00  Prostate Specific Ag, Serum Latest Ref Range: 0.0 - 4.0 ng/mL <0.1 <0.1 <0.1    IMPRESSION: 1. Previously noted focal area of increased activity noted over the left ilium is again noted. Decreased uptake noted on today's  exam suggesting response to therapy. Previously identified focal area of increased activity noted over the lumbar spine no longer visualized.  2. Punctate area of increased activity noted over the right seventh rib unchanged. This is again consistent with old fracture as noted on prior chest x-ray of 01/12/2016.  3. Mild increased activity noted over the right twelfth rib versus right kidney. Right rib series suggested for further evaluation.   Impression and Plan:   70 year old man with:  1.  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  His disease status was updated including review of laboratory data as well as imaging studies obtained on October 26, 2020.  His PSA continues to be undetectable without any imaging findings suggesting of relapsed disease.  Complication associated with continuing Xtandi including fatigue, hypertension among others.  Alternative treatment options clinic systemic chemotherapy were reviewed.  He is agreeable to continue with this plan.     2.  Back pain: Related to rule out prostate cancer in the bone.  Appears to have improved at this time.  3.  Androgen deprivation: Next injection will be August 2022 and every 4 months.  Complication clinic weight gain, hot flashes and sexual dysfunction were reiterated.   4.  Bone directed therapy: He is currently on Xgeva without any issues.  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   5.  Prognosis and goals of care: Therapy remains palliative although aggressive measures are warranted given his excellent pulm status.  6.  Follow-up: In 2 months for repeat evaluation and injection.  30  minutes were spent on this encounter.  Time was dedicated to reviewing laboratory data, imaging studies, discussing treatment options and future plan of care review.   Zola Button, MD 11/02/2020 8:15 AM

## 2020-11-04 ENCOUNTER — Other Ambulatory Visit: Payer: Self-pay | Admitting: Family Medicine

## 2020-11-04 DIAGNOSIS — E119 Type 2 diabetes mellitus without complications: Secondary | ICD-10-CM

## 2020-11-10 ENCOUNTER — Other Ambulatory Visit: Payer: Self-pay | Admitting: Family Medicine

## 2020-11-10 DIAGNOSIS — E119 Type 2 diabetes mellitus without complications: Secondary | ICD-10-CM

## 2020-11-10 DIAGNOSIS — E1169 Type 2 diabetes mellitus with other specified complication: Secondary | ICD-10-CM

## 2020-11-12 DIAGNOSIS — G4733 Obstructive sleep apnea (adult) (pediatric): Secondary | ICD-10-CM

## 2020-11-12 DIAGNOSIS — Z9989 Dependence on other enabling machines and devices: Secondary | ICD-10-CM

## 2020-11-12 NOTE — Procedures (Signed)
    Patient Name: Wesley White, Wesley White Date: 11/02/2020 Gender: Male D.O.B: [birth year] Age (years): 69 Referring Provider: [name] Height (inches): 48 Interpreting Physician: Baird Lyons MD, ABSM Weight (lbs): 218 RPSGT: Jacolyn Reedy BMI: 34 MRN: [id number] Neck Size: 17.00  CLINICAL INFORMATION Sleep Study Type: HST Indication for sleep study: OSA Epworth Sleepiness Score: 10  SLEEP STUDY TECHNIQUE A multi-channel overnight portable sleep study was performed. The channels recorded were: nasal airflow, thoracic respiratory movement, and oxygen saturation with a pulse oximetry. Snoring was also monitored.  MEDICATIONS Patient self administered medications include: none reported.  SLEEP ARCHITECTURE Patient was studied for 377.1 minutes. The sleep efficiency was 100.0 % and the patient was supine for 82.6%. The arousal index was 0.0 per hour.  RESPIRATORY PARAMETERS The overall AHI was 45.8 per hour, with a central apnea index of 0 per hour. The oxygen nadir was 77% during sleep.  CARDIAC DATA Mean heart rate during sleep was 70.6 bpm.  IMPRESSIONS - Severe obstructive sleep apnea occurred during this study (AHI = 45.8/h). - Severe oxygen desaturation was noted during this study (Min O2 = 77%). Mean O2 saturation 90%. - Patient snored .  DIAGNOSIS - Obstructive Sleep Apnea (G47.33) - Nocturnal Hypoxemia (G47.36)  RECOMMENDATIONS - Suggest CPAP titration sleep study or autopap. Other options would be based on clinical judgment. - Avoid alcohol, sedatives and other CNS depressants that may worsen sleep apnea and disrupt normal sleep architecture. - Sleep hygiene should be reviewed to assess factors that may improve sleep quality. - Weight management and regular exercise should be initiated or continued.  [Electronically signed] 11/12/2020 10:43 AM  Baird Lyons MD, ABSM Diplomate, American Board of Sleep Medicine   NPI: [id number]                          [place], [place] [facility]  ELECTRONICALLY SIGNED ON:  11/12/2020, 10:36 AM [place] PH: [phone]   FX: [phone] [place]

## 2020-11-14 ENCOUNTER — Other Ambulatory Visit (HOSPITAL_COMMUNITY): Payer: Self-pay

## 2020-11-16 ENCOUNTER — Other Ambulatory Visit (HOSPITAL_COMMUNITY): Payer: Self-pay

## 2020-11-17 ENCOUNTER — Other Ambulatory Visit (HOSPITAL_COMMUNITY): Payer: Self-pay

## 2020-11-29 ENCOUNTER — Other Ambulatory Visit (HOSPITAL_COMMUNITY): Payer: Self-pay

## 2020-12-12 ENCOUNTER — Other Ambulatory Visit (HOSPITAL_COMMUNITY): Payer: Self-pay

## 2020-12-13 ENCOUNTER — Other Ambulatory Visit (HOSPITAL_COMMUNITY): Payer: Self-pay

## 2020-12-13 ENCOUNTER — Telehealth: Payer: Self-pay

## 2020-12-13 NOTE — Telephone Encounter (Signed)
Pt had sleep study done 11/02/20 by Dr. Annamaria Boots. Please advise if a follow appt is needed or if we can just sen over the script to [facility] for machine and supplies .   Blima Singer RMA

## 2020-12-14 NOTE — Telephone Encounter (Signed)
Done KH 

## 2020-12-15 ENCOUNTER — Other Ambulatory Visit (HOSPITAL_COMMUNITY): Payer: Self-pay

## 2020-12-20 ENCOUNTER — Other Ambulatory Visit (HOSPITAL_COMMUNITY): Payer: Self-pay

## 2020-12-21 ENCOUNTER — Encounter: Payer: Self-pay | Admitting: Oncology

## 2020-12-21 ENCOUNTER — Other Ambulatory Visit (HOSPITAL_COMMUNITY): Payer: Self-pay

## 2020-12-21 ENCOUNTER — Telehealth: Payer: Self-pay

## 2020-12-21 NOTE — Telephone Encounter (Signed)
Oral Oncology Patient Advocate Encounter  Was successful in securing patient a $8000 grant from [employer] to provide copayment coverage for [place].  This will keep the out of pocket expense at $0.     [employer] ID: [id number]  I have spoken with the patient.   The billing information is as follows and has been shared with [place]: [id number] PCN: [id number] Member ID: [id number] Group ID: [id number] Dates of Eligibility: 11/20/20 through 11/19/21  Fund:  [place] Patient [place] Phone [phone] Fax [phone] 12/21/2020 9:05 AM

## 2020-12-22 ENCOUNTER — Other Ambulatory Visit: Payer: Self-pay

## 2020-12-22 ENCOUNTER — Encounter: Payer: Self-pay | Admitting: Family Medicine

## 2020-12-22 ENCOUNTER — Telehealth (INDEPENDENT_AMBULATORY_CARE_PROVIDER_SITE_OTHER): Payer: HMO | Admitting: Family Medicine

## 2020-12-22 VITALS — Temp 97.0°F | Wt 218.0 lb

## 2020-12-22 DIAGNOSIS — Z9989 Dependence on other enabling machines and devices: Secondary | ICD-10-CM

## 2020-12-22 DIAGNOSIS — G4733 Obstructive sleep apnea (adult) (pediatric): Secondary | ICD-10-CM | POA: Diagnosis not present

## 2020-12-22 NOTE — Progress Notes (Signed)
   Subjective:    Patient ID: Wesley White, male    DOB: 1950/10/06, 70 y.o.   MRN: [id number]  HPI Documentation for virtual audio and video telecommunications through [place] encounter: The patient was located at home. 2 patient identifiers used.  The provider was located in the office. The patient did consent to this visit and is aware of possible charges through their insurance for this visit. The other persons participating in this telemedicine service were none. Time spent on call was 10 minutes and in review of previous records >20 minutes total for counseling and coordination of care. This virtual service is not related to other E/M service within previous 7 days.  He recently completed a sleep study.  He does have an underlying history of OSA but needed another sleep study to qualify for a new CPAP.  He has an AHI of 45.8.  He was noted to desaturate down to 1 time of 77 but an average of 90.  He has been using a CPAP machine and it does not give him a readout and a ResMed nasal.  Review of Systems     Objective:   Physical Exam Alert and in no distress otherwise not examined.  I went over the above parameters with him.       Assessment & Plan:  OSA on CPAP I will write for him to get a new CPAP machine with supplies auto titrate 5-15 with a ResMed nasal.  Once he is stable on that then I think we will need to get a overnight pulse oximetry to make sure his oxygenation status.  He was comfortable with that.

## 2020-12-28 ENCOUNTER — Other Ambulatory Visit (HOSPITAL_COMMUNITY): Payer: Self-pay

## 2020-12-30 ENCOUNTER — Telehealth: Payer: Self-pay | Admitting: Oncology

## 2020-12-30 NOTE — Telephone Encounter (Signed)
Called patient regarding upcoming appointments, patient is notified. 

## 2021-01-02 ENCOUNTER — Other Ambulatory Visit (HOSPITAL_COMMUNITY): Payer: Self-pay

## 2021-01-03 ENCOUNTER — Inpatient Hospital Stay: Payer: HMO | Attending: Oncology

## 2021-01-03 ENCOUNTER — Other Ambulatory Visit: Payer: Self-pay

## 2021-01-03 ENCOUNTER — Other Ambulatory Visit: Payer: Self-pay | Admitting: Oncology

## 2021-01-03 DIAGNOSIS — Z5111 Encounter for antineoplastic chemotherapy: Secondary | ICD-10-CM | POA: Insufficient documentation

## 2021-01-03 DIAGNOSIS — C7951 Secondary malignant neoplasm of bone: Secondary | ICD-10-CM | POA: Insufficient documentation

## 2021-01-03 DIAGNOSIS — Z79899 Other long term (current) drug therapy: Secondary | ICD-10-CM | POA: Diagnosis not present

## 2021-01-03 DIAGNOSIS — R5383 Other fatigue: Secondary | ICD-10-CM | POA: Diagnosis not present

## 2021-01-03 DIAGNOSIS — Z923 Personal history of irradiation: Secondary | ICD-10-CM | POA: Insufficient documentation

## 2021-01-03 DIAGNOSIS — M549 Dorsalgia, unspecified: Secondary | ICD-10-CM | POA: Diagnosis not present

## 2021-01-03 DIAGNOSIS — I10 Essential (primary) hypertension: Secondary | ICD-10-CM | POA: Diagnosis not present

## 2021-01-03 DIAGNOSIS — C61 Malignant neoplasm of prostate: Secondary | ICD-10-CM | POA: Insufficient documentation

## 2021-01-03 LAB — CBC WITH DIFFERENTIAL (CANCER CENTER ONLY)
Abs Immature Granulocytes: 0.02 10*3/uL (ref 0.00–0.07)
Basophils Absolute: 0 10*3/uL (ref 0.0–0.1)
Basophils Relative: 1 %
Eosinophils Absolute: 0.1 10*3/uL (ref 0.0–0.5)
Eosinophils Relative: 1 %
HCT: 39 % (ref 39.0–52.0)
Hemoglobin: 13.6 g/dL (ref 13.0–17.0)
Immature Granulocytes: 1 %
Lymphocytes Relative: 15 %
Lymphs Abs: 0.6 10*3/uL — ABNORMAL LOW (ref 0.7–4.0)
MCH: 31.4 pg (ref 26.0–34.0)
MCHC: 34.9 g/dL (ref 30.0–36.0)
MCV: 90.1 fL (ref 80.0–100.0)
Monocytes Absolute: 0.4 10*3/uL (ref 0.1–1.0)
Monocytes Relative: 10 %
Neutro Abs: 3.2 10*3/uL (ref 1.7–7.7)
Neutrophils Relative %: 72 %
Platelet Count: 230 10*3/uL (ref 150–400)
RBC: 4.33 MIL/uL (ref 4.22–5.81)
RDW: 13.7 % (ref 11.5–15.5)
WBC Count: 4.4 10*3/uL (ref 4.0–10.5)
nRBC: 0 % (ref 0.0–0.2)

## 2021-01-03 LAB — CMP (CANCER CENTER ONLY)
ALT: 16 U/L (ref 0–44)
AST: 14 U/L — ABNORMAL LOW (ref 15–41)
Albumin: 3.6 g/dL (ref 3.5–5.0)
Alkaline Phosphatase: 49 U/L (ref 38–126)
Anion gap: 8 (ref 5–15)
BUN: 15 mg/dL (ref 8–23)
CO2: 24 mmol/L (ref 22–32)
Calcium: 9.6 mg/dL (ref 8.9–10.3)
Chloride: 104 mmol/L (ref 98–111)
Creatinine: 0.77 mg/dL (ref 0.61–1.24)
GFR, Estimated: >60 mL/min (ref >60)
Glucose, Bld: 230 mg/dL — ABNORMAL HIGH (ref 70–99)
Potassium: 4 mmol/L (ref 3.5–5.1)
Sodium: 136 mmol/L (ref 135–145)
Total Bilirubin: 0.4 mg/dL (ref 0.3–1.2)
Total Protein: 6.7 g/dL (ref 6.5–8.1)

## 2021-01-04 LAB — PROSTATE-SPECIFIC AG, SERUM (LABCORP): Prostate Specific Ag, Serum: <0.1 ng/mL (ref 0.0–4.0)

## 2021-01-05 ENCOUNTER — Other Ambulatory Visit: Payer: Self-pay

## 2021-01-05 ENCOUNTER — Inpatient Hospital Stay: Payer: HMO | Admitting: Oncology

## 2021-01-05 ENCOUNTER — Inpatient Hospital Stay: Payer: HMO

## 2021-01-05 VITALS — BP 115/75 | HR 84 | Temp 98.4°F | Resp 16

## 2021-01-05 VITALS — BP 110/73 | HR 98 | Temp 97.9°F | Resp 18 | Ht 67.0 in | Wt 220.3 lb

## 2021-01-05 DIAGNOSIS — C7951 Secondary malignant neoplasm of bone: Secondary | ICD-10-CM | POA: Diagnosis not present

## 2021-01-05 DIAGNOSIS — Z5111 Encounter for antineoplastic chemotherapy: Secondary | ICD-10-CM | POA: Diagnosis not present

## 2021-01-05 DIAGNOSIS — C61 Malignant neoplasm of prostate: Secondary | ICD-10-CM

## 2021-01-05 MED ORDER — LEUPROLIDE ACETATE (4 MONTH) 30 MG ~~LOC~~ KIT
30.0000 mg | PACK | Freq: Once | SUBCUTANEOUS | Status: AC
  Administered 2021-01-05: 30 mg via SUBCUTANEOUS
  Filled 2021-01-05: qty 30

## 2021-01-05 MED ORDER — DENOSUMAB 120 MG/1.7ML ~~LOC~~ SOLN
120.0000 mg | Freq: Once | SUBCUTANEOUS | Status: AC
  Administered 2021-01-05: 120 mg via SUBCUTANEOUS
  Filled 2021-01-05: qty 1.7

## 2021-01-05 NOTE — Progress Notes (Signed)
Hematology and Oncology Follow Up   Wesley White [id number] 07-03-1950 70 y.o. 01/05/2021 8:43 AM [name], MDLalonde, [name], MD     Principle Diagnosis: 70 year old man with advanced prostate cancer with disease to the bone diagnosed in 2020.  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  Prior Therapy:   He is status post laparoscopic radical prostatectomy and pelvic lymphadenectomy on March 05, 2016.  The final pathology showed Gleason score 4+3 equal 7 with 0 out of 9 lymph node involved.  He did have extraprostatic extension at that time.   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  He is status post radiation therapy adjuvantly under the care of Dr. Tammi Klippel completed on May 4 of 2018.  He received 68.4 Gray in 38 fractions.  Status post radiation therapy for isolated left iliac bone metastasis completed in June 2020.  Zytiga 1000 mg daily started in August 2020.  Therapy discontinued because of lack of coverage   He developed worsening back pain and a rise in his PSA up to 2.7 with MRI showed worsening disease in the spine including T12, T1 and T5.  He is status post radiation therapy to the T11 and L4 completed on February 04, 2020 after completing 30 Gray in 10 fractions.   Current therapy:   Eligard 30 mg every 4 months.  He will receive Eligard today and repeated in 4 months.  Xgeva 120 mg every 2 months.   Xtandi 160 mg daily started on January 20, 2020    Interim History: [name] returns today for repeat evaluation.  Since last visit, he reports no major changes in his health.  He has reported some increased fatigue and tiredness associated with Xtandi but no other complaints.  He denies any nausea, vomiting or bone pain.  He denies any hospitalization or illnesses.  As performance status quality of life remained stable.               Medications: Unchanged on  review. Current Outpatient Medications  Medication Sig Dispense Refill   acetaminophen (TYLENOL) 325 MG tablet Take 650 mg by mouth every 6 (six) hours as needed for mild pain.      CIALIS 5 MG tablet Take 5 mg by mouth daily. (Patient not taking: No sig reported)  11   enzalutamide (XTANDI) 40 MG tablet TAKE 4 TABLETS (160 MG TOTAL) BY MOUTH DAILY. 120 tablet 3   HYDROcodone-acetaminophen (NORCO) 5-325 MG tablet Take 1 tablet by mouth every 6 (six) hours as needed for moderate pain. (Patient not taking: No sig reported) 15 tablet 0   ibuprofen (ADVIL) 600 MG tablet Take 600 mg by mouth every 6 (six) hours as needed. (Patient not taking: No sig reported)     INVOKANA 300 MG TABS tablet TAKE 1 TABLET BY MOUTH EVERY DAY BEFORE BREAKFAST 90 tablet 1   lisinopril (ZESTRIL) 5 MG tablet TAKE 1 TABLET BY MOUTH EVERY DAY 90 tablet 3   metFORMIN (GLUCOPHAGE) 500 MG tablet TAKE 1 TABLET BY MOUTH TWICE A DAY WITH A MEAL 180 tablet 1   naproxen sodium (ALEVE) 220 MG tablet Take 220 mg by mouth daily as needed.      OneTouch Delica Lancets [id number] MISC See admin instructions.     ONETOUCH VERIO test strip USE AS DIRECTED AS NEEDED 100 strip 11   oxyCODONE-acetaminophen (PERCOCET/ROXICET) 5-325 MG tablet Take 1-2 tablets by mouth every  6 (six) hours as needed for moderate pain or severe pain. (Patient not taking: No sig reported) 60 tablet 0   rosuvastatin (CRESTOR) 40 MG tablet Take 1 tablet (40 mg total) by mouth daily. 90 tablet 3   No current facility-administered medications for this visit.   Facility-Administered Medications Ordered in Other Visits  Medication Dose Route Frequency Provider Last Rate Last Admin   magnesium citrate solution 1 Bottle  1 Bottle Oral Once Raynelle Bring, MD       sodium phosphate (FLEET) 7-19 GM/118ML enema 1 enema  1 enema Rectal Once Raynelle Bring, MD           Physical Exam:     Blood pressure 110/73, pulse 98, temperature 97.9 F (36.6 C), temperature source  Oral, resp. rate 18, height '5\' 7"'$  (1.702 m), weight 220 lb 4.8 oz (99.9 kg), SpO2 97 %.       ECOG: 1    General appearance: Alert, awake without any distress. Head: Atraumatic without abnormalities Oropharynx: Without any thrush or ulcers. Eyes: No scleral icterus. Lymph nodes: No lymphadenopathy noted in the cervical, supraclavicular, or axillary nodes Heart:regular rate and rhythm, without any murmurs or gallops.   Lung: Clear to auscultation without any rhonchi, wheezes or dullness to percussion. Abdomin: Soft, nontender without any shifting dullness or ascites. Musculoskeletal: No clubbing or cyanosis. Neurological: No motor or sensory deficits. Skin: No rashes or lesions.                 Lab Results: Lab Results  Component Value Date   WBC 4.4 01/03/2021   HGB 13.6 01/03/2021   HCT 39.0 01/03/2021   MCV 90.1 01/03/2021   PLT 230 01/03/2021     Chemistry      Component Value Date/Time   NA 136 01/03/2021 1055   NA 136 10/17/2018 1139   K 4.0 01/03/2021 1055   CL 104 01/03/2021 1055   CO2 24 01/03/2021 1055   BUN 15 01/03/2021 1055   BUN 11 10/17/2018 1139   CREATININE 0.77 01/03/2021 1055   CREATININE 0.81 06/08/2015 0001      Component Value Date/Time   CALCIUM 9.6 01/03/2021 1055   ALKPHOS 49 01/03/2021 1055   AST 14 (L) 01/03/2021 1055   ALT 16 01/03/2021 1055   BILITOT 0.4 01/03/2021 1055       Results for MARKAL, CEDILLOS (MRN [id number]) as of 01/05/2021 08:45  Ref. Range 01/03/2021 10:55  Prostate Specific Ag, Serum Latest Ref Range: 0.0 - 4.0 ng/mL <0.1        Impression and Plan:   70 year old man with:  1.  Advanced prostate cancer with disease to the bone diagnosed in 2020.  He developed castration-resistant at this time.  He continues to tolerate Xtandi with excellent tolerance and PSA response that is currently undetectable.  Risks and benefits of continuing this treatment were discussed at this time.  Complications that  include hypertension, fatigue and rarely seizures were reiterated.  Alternative treatment options including systemic chemotherapy were reviewed.  He is agreeable to continue at this time.    2.  Back pain: No recent exacerbation noted at this time.  3.  Androgen deprivation: He will receive Eligard today and repeated in 4 months.  Complications that include hot flashes, weight gain were discussed.   4.  Bone directed therapy: He will receive Xgeva today and repeated in 2 months.  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   5.  Prognosis  and goals of care: His disease is incurable although aggressive measures are warranted given his excellent performance status.  6.  Follow-up: He will return in 8 weeks for repeat follow-up.  30  minutes were dedicated to this visit.  Time spent on reviewing laboratory data, disease status update, treatment choices and addressing complications related to cancer and cancer therapy.   Zola Button, MD 01/05/2021 8:43 AM

## 2021-01-06 ENCOUNTER — Ambulatory Visit (INDEPENDENT_AMBULATORY_CARE_PROVIDER_SITE_OTHER): Payer: HMO | Admitting: Family Medicine

## 2021-01-06 VITALS — BP 120/80 | HR 87 | Temp 96.7°F | Wt 220.2 lb

## 2021-01-06 DIAGNOSIS — R8290 Unspecified abnormal findings in urine: Secondary | ICD-10-CM

## 2021-01-06 DIAGNOSIS — R399 Unspecified symptoms and signs involving the genitourinary system: Secondary | ICD-10-CM | POA: Diagnosis not present

## 2021-01-06 LAB — POCT URINALYSIS DIP (PROADVANTAGE DEVICE)
Bilirubin, UA: NEGATIVE
Glucose, UA: 500 mg/dL — AB
Ketones, POC UA: NEGATIVE mg/dL
Leukocytes, UA: NEGATIVE
Nitrite, UA: NEGATIVE
Protein Ur, POC: 30 mg/dL — AB
Specific Gravity, Urine: 1.025
Urobilinogen, Ur: 0.2
pH, UA: 6 (ref 5.0–8.0)

## 2021-01-06 NOTE — Progress Notes (Signed)
   Subjective:    Patient ID: Wesley White, male    DOB: 1951/03/17, 70 y.o.   MRN: [id number]  HPI He notes that over the last several weeks that his urine seem to be more frothy and bubbly.  He has had no dysuria frequency, urgency, lower abdominal pain.  Does have a previous history of metastatic prostate cancer with radiation in 2018.   Review of Systems     Objective:   Physical Exam Urine microscopic was essentially negative for red and white cells.       Assessment & Plan:  UTI symptoms - Plan: POCT Urinalysis DIP (Proadvantage Device), Urine Culture  Abnormal urine - Plan: Urine Culture I will do a urine culture to see what it shows.  Do not feel the great need to put him on an antibiotic at the present time.  He has no symptoms of possible bladder fistula but that certainly a possibility.

## 2021-01-08 LAB — URINE CULTURE: Organism ID, Bacteria: NO GROWTH

## 2021-01-09 NOTE — Progress Notes (Signed)
Pt was advised Wesley White 

## 2021-01-23 ENCOUNTER — Telehealth: Payer: Self-pay | Admitting: Oncology

## 2021-01-23 NOTE — Telephone Encounter (Signed)
Sch per 8/12 los, pt aware

## 2021-01-24 DIAGNOSIS — G4733 Obstructive sleep apnea (adult) (pediatric): Secondary | ICD-10-CM | POA: Diagnosis not present

## 2021-01-27 ENCOUNTER — Other Ambulatory Visit (HOSPITAL_COMMUNITY): Payer: Self-pay

## 2021-01-27 ENCOUNTER — Other Ambulatory Visit: Payer: Self-pay | Admitting: Oncology

## 2021-01-27 MED ORDER — ENZALUTAMIDE 40 MG PO TABS
ORAL_TABLET | ORAL | 3 refills | Status: DC
  Filled 2021-01-27: qty 120, 30d supply, fill #0
  Filled 2021-02-23: qty 120, 30d supply, fill #1
  Filled 2021-04-13: qty 120, 30d supply, fill #2
  Filled 2021-05-12: qty 120, 30d supply, fill #3

## 2021-02-01 ENCOUNTER — Other Ambulatory Visit (HOSPITAL_COMMUNITY): Payer: Self-pay

## 2021-02-02 ENCOUNTER — Other Ambulatory Visit (HOSPITAL_COMMUNITY): Payer: Self-pay

## 2021-02-13 ENCOUNTER — Other Ambulatory Visit: Payer: Self-pay

## 2021-02-13 DIAGNOSIS — R8290 Unspecified abnormal findings in urine: Secondary | ICD-10-CM

## 2021-02-14 ENCOUNTER — Other Ambulatory Visit (INDEPENDENT_AMBULATORY_CARE_PROVIDER_SITE_OTHER): Payer: HMO

## 2021-02-14 ENCOUNTER — Telehealth: Payer: Self-pay

## 2021-02-14 ENCOUNTER — Other Ambulatory Visit: Payer: Self-pay

## 2021-02-14 ENCOUNTER — Telehealth: Payer: Self-pay | Admitting: Family Medicine

## 2021-02-14 DIAGNOSIS — R8290 Unspecified abnormal findings in urine: Secondary | ICD-10-CM | POA: Diagnosis not present

## 2021-02-14 LAB — POCT URINALYSIS DIP (PROADVANTAGE DEVICE)
Bilirubin, UA: NEGATIVE
Blood, UA: NEGATIVE
Glucose, UA: >=1000 mg/dL — AB
Ketones, POC UA: NEGATIVE mg/dL
Leukocytes, UA: NEGATIVE
Nitrite, UA: NEGATIVE
Protein Ur, POC: NEGATIVE mg/dL
Specific Gravity, Urine: 1.02
Urobilinogen, Ur: 0.2
pH, UA: 5.5 (ref 5.0–8.0)

## 2021-02-14 NOTE — Telephone Encounter (Signed)
Pt emailed cpap report to me. I have printed that off and sending to back for review.

## 2021-02-14 NOTE — Telephone Encounter (Signed)
Pt was advised of U/A results Kh

## 2021-02-14 NOTE — Telephone Encounter (Signed)
Please advise on pt recent urine results. Rabun

## 2021-02-23 ENCOUNTER — Other Ambulatory Visit (HOSPITAL_COMMUNITY): Payer: Self-pay

## 2021-02-24 DIAGNOSIS — G4733 Obstructive sleep apnea (adult) (pediatric): Secondary | ICD-10-CM | POA: Diagnosis not present

## 2021-03-01 ENCOUNTER — Encounter: Payer: Self-pay | Admitting: Family Medicine

## 2021-03-01 ENCOUNTER — Other Ambulatory Visit: Payer: Self-pay

## 2021-03-01 ENCOUNTER — Telehealth (INDEPENDENT_AMBULATORY_CARE_PROVIDER_SITE_OTHER): Payer: HMO | Admitting: Family Medicine

## 2021-03-01 VITALS — Temp 97.2°F | Wt 220.0 lb

## 2021-03-01 DIAGNOSIS — E119 Type 2 diabetes mellitus without complications: Secondary | ICD-10-CM | POA: Diagnosis not present

## 2021-03-01 DIAGNOSIS — Z23 Encounter for immunization: Secondary | ICD-10-CM

## 2021-03-01 DIAGNOSIS — Z8546 Personal history of malignant neoplasm of prostate: Secondary | ICD-10-CM

## 2021-03-01 NOTE — Progress Notes (Signed)
   Subjective:    Patient ID: Wesley White, male    DOB: 1950-08-26, 70 y.o.   MRN: [id number]  HPI Documentation for virtual audio and video telecommunications through [facility] encounter: The patient was located at home. 2 patient identifiers used.  The provider was located in the office. The patient did consent to this visit and is aware of possible charges through their insurance for this visit. The other persons participating in this telemedicine service were none. Time spent on call was 3 minutes and in review of previous records >15 minutes total for counseling and coordination of care. This virtual service is not related to other E/M service within previous 7 days.  He states that on Sunday he developed slight congestion as well as a dry cough but no fever, chills, shortness of breath, smell or taste change.  He has been tested for COVID and is positive.  Since then his symptoms have not changed any.  He does have underlying diabetes as well as prostate cancer.  Review of Systems     Objective:   Physical Exam Alert and in no distress with normal breathing pattern noted.       Assessment & Plan:  Need for COVID-19 vaccine  History of prostate cancer  Controlled type 2 diabetes mellitus without complication, without long-term current use of insulin ([place]) I discussed the fact that since he is doing quite well, antiviral intervention is really not needed.  Explained the 5-day window for treatment as well as wearing a mask for the next 5 days after that.  If his symptoms including fever, cough, shortness of breath get worse, he will call me and I explained that I would very quickly put him on medication.  He was comfortable with that.

## 2021-03-06 ENCOUNTER — Inpatient Hospital Stay: Payer: HMO | Attending: Oncology

## 2021-03-06 ENCOUNTER — Other Ambulatory Visit: Payer: Self-pay

## 2021-03-06 ENCOUNTER — Other Ambulatory Visit: Payer: HMO

## 2021-03-06 DIAGNOSIS — R5383 Other fatigue: Secondary | ICD-10-CM | POA: Diagnosis not present

## 2021-03-06 DIAGNOSIS — Z923 Personal history of irradiation: Secondary | ICD-10-CM | POA: Insufficient documentation

## 2021-03-06 DIAGNOSIS — M549 Dorsalgia, unspecified: Secondary | ICD-10-CM | POA: Insufficient documentation

## 2021-03-06 DIAGNOSIS — Z79899 Other long term (current) drug therapy: Secondary | ICD-10-CM | POA: Insufficient documentation

## 2021-03-06 DIAGNOSIS — C61 Malignant neoplasm of prostate: Secondary | ICD-10-CM | POA: Insufficient documentation

## 2021-03-06 DIAGNOSIS — R232 Flushing: Secondary | ICD-10-CM | POA: Insufficient documentation

## 2021-03-06 DIAGNOSIS — C7951 Secondary malignant neoplasm of bone: Secondary | ICD-10-CM

## 2021-03-06 LAB — CMP (CANCER CENTER ONLY)
ALT: 15 U/L (ref 0–44)
AST: 16 U/L (ref 15–41)
Albumin: 3.7 g/dL (ref 3.5–5.0)
Alkaline Phosphatase: 61 U/L (ref 38–126)
Anion gap: 11 (ref 5–15)
BUN: 15 mg/dL (ref 8–23)
CO2: 24 mmol/L (ref 22–32)
Calcium: 9.9 mg/dL (ref 8.9–10.3)
Chloride: 104 mmol/L (ref 98–111)
Creatinine: 0.76 mg/dL (ref 0.61–1.24)
GFR, Estimated: >60 mL/min (ref >60)
Glucose, Bld: 120 mg/dL — ABNORMAL HIGH (ref 70–99)
Potassium: 4.3 mmol/L (ref 3.5–5.1)
Sodium: 139 mmol/L (ref 135–145)
Total Bilirubin: 0.3 mg/dL (ref 0.3–1.2)
Total Protein: 7.3 g/dL (ref 6.5–8.1)

## 2021-03-06 LAB — CBC WITH DIFFERENTIAL (CANCER CENTER ONLY)
Abs Immature Granulocytes: 0.1 10*3/uL — ABNORMAL HIGH (ref 0.00–0.07)
Basophils Absolute: 0 10*3/uL (ref 0.0–0.1)
Basophils Relative: 1 %
Eosinophils Absolute: 0.1 10*3/uL (ref 0.0–0.5)
Eosinophils Relative: 1 %
HCT: 41.5 % (ref 39.0–52.0)
Hemoglobin: 14 g/dL (ref 13.0–17.0)
Immature Granulocytes: 2 %
Lymphocytes Relative: 17 %
Lymphs Abs: 0.8 10*3/uL (ref 0.7–4.0)
MCH: 30.4 pg (ref 26.0–34.0)
MCHC: 33.7 g/dL (ref 30.0–36.0)
MCV: 90.2 fL (ref 80.0–100.0)
Monocytes Absolute: 0.4 10*3/uL (ref 0.1–1.0)
Monocytes Relative: 7 %
Neutro Abs: 3.5 10*3/uL (ref 1.7–7.7)
Neutrophils Relative %: 72 %
Platelet Count: 294 10*3/uL (ref 150–400)
RBC: 4.6 MIL/uL (ref 4.22–5.81)
RDW: 13.7 % (ref 11.5–15.5)
WBC Count: 4.9 10*3/uL (ref 4.0–10.5)
nRBC: 0 % (ref 0.0–0.2)

## 2021-03-07 ENCOUNTER — Other Ambulatory Visit (HOSPITAL_COMMUNITY): Payer: Self-pay

## 2021-03-07 LAB — PROSTATE-SPECIFIC AG, SERUM (LABCORP): Prostate Specific Ag, Serum: <0.1 ng/mL (ref 0.0–4.0)

## 2021-03-08 ENCOUNTER — Inpatient Hospital Stay: Payer: HMO | Admitting: Oncology

## 2021-03-08 ENCOUNTER — Other Ambulatory Visit: Payer: Self-pay

## 2021-03-08 ENCOUNTER — Inpatient Hospital Stay: Payer: HMO

## 2021-03-08 VITALS — BP 132/74 | HR 97 | Temp 98.2°F | Resp 18 | Ht 67.0 in | Wt 218.3 lb

## 2021-03-08 DIAGNOSIS — C61 Malignant neoplasm of prostate: Secondary | ICD-10-CM

## 2021-03-08 DIAGNOSIS — C7951 Secondary malignant neoplasm of bone: Secondary | ICD-10-CM

## 2021-03-08 MED ORDER — DENOSUMAB 120 MG/1.7ML ~~LOC~~ SOLN
120.0000 mg | Freq: Once | SUBCUTANEOUS | Status: AC
  Administered 2021-03-08: 120 mg via SUBCUTANEOUS
  Filled 2021-03-08: qty 1.7

## 2021-03-08 NOTE — Progress Notes (Signed)
Hematology and Oncology Follow Up   Wesley White [id number] 09-20-1950 70 y.o. 03/08/2021 9:30 AM [name], MDLalonde, [name], MD     Principle Diagnosis: 70 year old man with castration-resistant advanced prostate cancer with disease to the bone diagnosed in 2020.  He presented with PSA was 10.4 and a Gleason score 4+3 = 7 in 2017.  Prior Therapy:   He is status post laparoscopic radical prostatectomy and pelvic lymphadenectomy on March 05, 2016.  The final pathology showed Gleason score 4+3 equal 7 with 0 out of 9 lymph node involved.  He did have extraprostatic extension at that time.   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  He is status post radiation therapy adjuvantly under the care of Dr. Tammi Klippel completed on May 4 of 2018.  He received 68.4 Gray in 38 fractions.  Status post radiation therapy for isolated left iliac bone metastasis completed in June 2020.  Zytiga 1000 mg daily started in August 2020.  Therapy discontinued because of lack of coverage   He developed worsening back pain and a rise in his PSA up to 2.7 with MRI showed worsening disease in the spine including T12, T1 and T5.  He is status post radiation therapy to the T11 and L4 completed on February 04, 2020 after completing 30 Gray in 10 fractions.   Current therapy:   Eligard 30 mg every 4 months.  He will receive Eligard in December 2022.  Xgeva 120 mg every 2 months.  This will be given today and repeated in 2 months.  Xtandi 160 mg daily started on January 20, 2020    Interim History: Wesley White is here for a follow-up visit.  Since her last visit, he reports feeling well without any major complaints.  He denies any nausea, vomiting or abdominal pain.  He denies any excessive fatigue or tiredness.  He does report some mild symptoms of fatigue and lack of stamina but overall symptoms are manageable.  Still able to perform activities of daily living without any  decline.              Medications: Updated on review. Current Outpatient Medications  Medication Sig Dispense Refill   acetaminophen (TYLENOL) 325 MG tablet Take 650 mg by mouth every 6 (six) hours as needed for mild pain.      CIALIS 5 MG tablet Take 5 mg by mouth daily. (Patient not taking: No sig reported)  11   enzalutamide (XTANDI) 40 MG tablet TAKE 4 TABLETS (160 MG TOTAL) BY MOUTH DAILY. 120 tablet 3   HYDROcodone-acetaminophen (NORCO) 5-325 MG tablet Take 1 tablet by mouth every 6 (six) hours as needed for moderate pain. (Patient not taking: Reported on 03/01/2021) 15 tablet 0   ibuprofen (ADVIL) 600 MG tablet Take 600 mg by mouth every 6 (six) hours as needed. (Patient not taking: No sig reported)     INVOKANA 300 MG TABS tablet TAKE 1 TABLET BY MOUTH EVERY DAY BEFORE BREAKFAST 90 tablet 1   lisinopril (ZESTRIL) 5 MG tablet TAKE 1 TABLET BY MOUTH EVERY DAY 90 tablet 3   metFORMIN (GLUCOPHAGE) 500 MG tablet TAKE 1 TABLET BY MOUTH TWICE A DAY WITH A MEAL 180 tablet 1   naproxen sodium (ALEVE) 220 MG tablet Take 220 mg by mouth daily as needed.  (Patient not taking: Reported on 03/01/2021)     OneTouch Delica Lancets 44H MISC See admin instructions.     ONETOUCH VERIO test strip USE AS DIRECTED  AS NEEDED 100 strip 11   oxyCODONE-acetaminophen (PERCOCET/ROXICET) 5-325 MG tablet Take 1-2 tablets by mouth every 6 (six) hours as needed for moderate pain or severe pain. (Patient not taking: No sig reported) 60 tablet 0   rosuvastatin (CRESTOR) 40 MG tablet Take 1 tablet (40 mg total) by mouth daily. 90 tablet 3   No current facility-administered medications for this visit.   Facility-Administered Medications Ordered in Other Visits  Medication Dose Route Frequency Provider Last Rate Last Admin   magnesium citrate solution 1 Bottle  1 Bottle Oral Once Raynelle Bring, MD       sodium phosphate (FLEET) 7-19 GM/118ML enema 1 enema  1 enema Rectal Once Raynelle Bring, MD            Physical Exam:     Blood pressure 132/74, pulse 97, temperature 98.2 F (36.8 C), temperature source Oral, resp. rate 18, height 5\' 7"  (1.702 m), weight 218 lb 4.8 oz (99 kg), SpO2 99 %.        ECOG: 1    General appearance: Comfortable appearing without any discomfort Head: Normocephalic without any trauma Oropharynx: Mucous membranes are moist and pink without any thrush or ulcers. Eyes: Pupils are equal and round reactive to light. Lymph nodes: No cervical, supraclavicular, inguinal or axillary lymphadenopathy.   Heart:regular rate and rhythm.  S1 and S2 without leg edema. Lung: Clear without any rhonchi or wheezes.  No dullness to percussion. Abdomin: Soft, nontender, nondistended with good bowel sounds.  No hepatosplenomegaly. Musculoskeletal: No joint deformity or effusion.  Full range of motion noted. Neurological: No deficits noted on motor, sensory and deep tendon reflex exam. Skin: No petechial rash or dryness.  Appeared moist.  Psychiatric: Mood and affect appeared appropriate.                 Lab Results: Lab Results  Component Value Date   WBC 4.9 03/06/2021   HGB 14.0 03/06/2021   HCT 41.5 03/06/2021   MCV 90.2 03/06/2021   PLT 294 03/06/2021     Chemistry      Component Value Date/Time   NA 139 03/06/2021 1455   NA 136 10/17/2018 1139   K 4.3 03/06/2021 1455   CL 104 03/06/2021 1455   CO2 24 03/06/2021 1455   BUN 15 03/06/2021 1455   BUN 11 10/17/2018 1139   CREATININE 0.76 03/06/2021 1455   CREATININE 0.81 06/08/2015 0001      Component Value Date/Time   CALCIUM 9.9 03/06/2021 1455   ALKPHOS 61 03/06/2021 1455   AST 16 03/06/2021 1455   ALT 15 03/06/2021 1455   BILITOT 0.3 03/06/2021 1455        Results for Wesley White (MRN [id number]) as of 03/08/2021 09:12  Ref. Range 03/06/2021 14:55  Prostate Specific Ag, Serum Latest Ref Range: 0.0 - 4.0 ng/mL <0.1        Impression and Plan:   70 year old man  with:  1.  Castration-resistant advanced prostate cancer with disease to the bone diagnosed in 2020.   He experienced excellent clinical benefit and a PSA response on Xtandi with PSA remains undetectable.  Risks and benefits of continuing this treatment long-term were discussed.  Complications including hypertension were reiterated.  Alternative treatment options including chemotherapy will be deferred unless he has progression of disease.  He is agreeable to continue at this time.    2.  Back pain: Related to prostate cancer metastasis.  Status post radiation with improvement in his pain.  3.  Androgen deprivation: This will be given in 2 months and every 4 months after that.  Complications including weight gain, hot flashes among others.   4.  Bone directed therapy: He will receive Xgeva today and in 2 months.  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   5.  Prognosis and goals of care: Therapy remains palliative although aggressive measures are warranted at this time.  6.  Follow-up: In 2 months for repeat follow-up.  30  minutes were spent on this encounter.  The time was dedicated to reviewing laboratory data, disease status update and outlining future plan of care.   Zola Button, MD 03/08/2021 9:30 AM

## 2021-03-08 NOTE — Patient Instructions (Signed)
Denosumab injection What is this medication? DENOSUMAB (den oh sue mab) slows bone breakdown. Prolia is used to treat osteoporosis in women after menopause and in men, and in people who are taking corticosteroids for 6 months or more. Delton See is used to treat a high calcium level due to cancer and to prevent bone fractures and other bone problems caused by multiple myeloma or cancer bone metastases. Delton See is also used to treat giant cell tumor of the bone. This medicine may be used for other purposes; ask your health care provider or pharmacist if you have questions. COMMON BRAND NAME(S): Prolia, XGEVA What should I tell my care team before I take this medication? They need to know if you have any of these conditions: dental disease having surgery or tooth extraction infection kidney disease low levels of calcium or Vitamin D in the blood malnutrition on hemodialysis skin conditions or sensitivity thyroid or parathyroid disease an unusual reaction to denosumab, other medicines, foods, dyes, or preservatives pregnant or trying to get pregnant breast-feeding How should I use this medication? This medicine is for injection under the skin. It is given by a health care professional in a hospital or clinic setting. A special MedGuide will be given to you before each treatment. Be sure to read this information carefully each time. For Prolia, talk to your pediatrician regarding the use of this medicine in children. Special care may be needed. For Delton See, talk to your pediatrician regarding the use of this medicine in children. While this drug may be prescribed for children as young as 13 years for selected conditions, precautions do apply. Overdosage: If you think you have taken too much of this medicine contact a poison control center or emergency room at once. NOTE: This medicine is only for you. Do not share this medicine with others. What if I miss a dose? It is important not to miss your dose.  Call your doctor or health care professional if you are unable to keep an appointment. What may interact with this medication? Do not take this medicine with any of the following medications: other medicines containing denosumab This medicine may also interact with the following medications: medicines that lower your chance of fighting infection steroid medicines like prednisone or cortisone This list may not describe all possible interactions. Give your health care provider a list of all the medicines, herbs, non-prescription drugs, or dietary supplements you use. Also tell them if you smoke, drink alcohol, or use illegal drugs. Some items may interact with your medicine. What should I watch for while using this medication? Visit your doctor or health care professional for regular checks on your progress. Your doctor or health care professional may order blood tests and other tests to see how you are doing. Call your doctor or health care professional for advice if you get a fever, chills or sore throat, or other symptoms of a cold or flu. Do not treat yourself. This drug may decrease your body's ability to fight infection. Try to avoid being around people who are sick. You should make sure you get enough calcium and vitamin D while you are taking this medicine, unless your doctor tells you not to. Discuss the foods you eat and the vitamins you take with your health care professional. See your dentist regularly. Brush and floss your teeth as directed. Before you have any dental work done, tell your dentist you are receiving this medicine. Do not become pregnant while taking this medicine or for 5 months after  stopping it. Talk with your doctor or health care professional about your birth control options while taking this medicine. Women should inform their doctor if they wish to become pregnant or think they might be pregnant. There is a potential for serious side effects to an unborn child. Talk to  your health care professional or pharmacist for more information. What side effects may I notice from receiving this medication? Side effects that you should report to your doctor or health care professional as soon as possible: allergic reactions like skin rash, itching or hives, swelling of the face, lips, or tongue bone pain breathing problems dizziness jaw pain, especially after dental work redness, blistering, peeling of the skin signs and symptoms of infection like fever or chills; cough; sore throat; pain or trouble passing urine signs of low calcium like fast heartbeat, muscle cramps or muscle pain; pain, tingling, numbness in the hands or feet; seizures unusual bleeding or bruising unusually weak or tired Side effects that usually do not require medical attention (report to your doctor or health care professional if they continue or are bothersome): constipation diarrhea headache joint pain loss of appetite muscle pain runny nose tiredness upset stomach This list may not describe all possible side effects. Call your doctor for medical advice about side effects. You may report side effects to FDA at 1-800-FDA-1088. Where should I keep my medication? This medicine is only given in a clinic, doctor's office, or other health care setting and will not be stored at home. NOTE: This sheet is a summary. It may not cover all possible information. If you have questions about this medicine, talk to your doctor, pharmacist, or health care provider.  2022 [employer]vier/Gold Standard (2017-09-20 16:10:44)

## 2021-03-15 ENCOUNTER — Encounter: Payer: Self-pay | Admitting: Family Medicine

## 2021-03-26 DIAGNOSIS — G4733 Obstructive sleep apnea (adult) (pediatric): Secondary | ICD-10-CM | POA: Diagnosis not present

## 2021-03-28 ENCOUNTER — Encounter: Payer: Self-pay | Admitting: Family Medicine

## 2021-03-28 ENCOUNTER — Ambulatory Visit (INDEPENDENT_AMBULATORY_CARE_PROVIDER_SITE_OTHER): Payer: HMO | Admitting: Family Medicine

## 2021-03-28 ENCOUNTER — Other Ambulatory Visit: Payer: Self-pay

## 2021-03-28 VITALS — BP 108/64 | HR 77 | Ht 67.0 in | Wt 215.4 lb

## 2021-03-28 DIAGNOSIS — E1169 Type 2 diabetes mellitus with other specified complication: Secondary | ICD-10-CM

## 2021-03-28 DIAGNOSIS — Z9989 Dependence on other enabling machines and devices: Secondary | ICD-10-CM | POA: Diagnosis not present

## 2021-03-28 DIAGNOSIS — Z0000 Encounter for general adult medical examination without abnormal findings: Secondary | ICD-10-CM

## 2021-03-28 DIAGNOSIS — R232 Flushing: Secondary | ICD-10-CM

## 2021-03-28 DIAGNOSIS — E119 Type 2 diabetes mellitus without complications: Secondary | ICD-10-CM | POA: Diagnosis not present

## 2021-03-28 DIAGNOSIS — C61 Malignant neoplasm of prostate: Secondary | ICD-10-CM

## 2021-03-28 DIAGNOSIS — Z87442 Personal history of urinary calculi: Secondary | ICD-10-CM

## 2021-03-28 DIAGNOSIS — E669 Obesity, unspecified: Secondary | ICD-10-CM | POA: Diagnosis not present

## 2021-03-28 DIAGNOSIS — C7951 Secondary malignant neoplasm of bone: Secondary | ICD-10-CM

## 2021-03-28 DIAGNOSIS — G4733 Obstructive sleep apnea (adult) (pediatric): Secondary | ICD-10-CM

## 2021-03-28 DIAGNOSIS — E785 Hyperlipidemia, unspecified: Secondary | ICD-10-CM

## 2021-03-28 DIAGNOSIS — I700 Atherosclerosis of aorta: Secondary | ICD-10-CM | POA: Diagnosis not present

## 2021-03-28 DIAGNOSIS — Z23 Encounter for immunization: Secondary | ICD-10-CM | POA: Diagnosis not present

## 2021-03-28 DIAGNOSIS — Z8546 Personal history of malignant neoplasm of prostate: Secondary | ICD-10-CM | POA: Diagnosis not present

## 2021-03-28 MED ORDER — VENLAFAXINE HCL ER 37.5 MG PO CP24: 37.5000 mg | ORAL_CAPSULE | Freq: Every day | ORAL | 1 refills | Status: DC

## 2021-03-28 NOTE — Progress Notes (Addendum)
Wesley White is a 70 y.o. male who presents for annual wellness visit ,CPE and follow-up on chronic medical conditions.  He he had COVID in mid October and we will therefore wait on any further immunizations.  He is set up for an eye exam.  His last hemoglobin A1c was 7.1.  He is being treated for metastatic prostate cancer and has having difficulty with his medications causing hot flashes.  He is followed closely by oncology because of this.  He does have OSA and is using CPAP with great results.  He is very happy with this.  He does have a history of renal stones but has not had any recent difficulty He also has a recent scan that showed athrosclerosis  Immunizations and Health Maintenance Immunization History  Administered Date(s) Administered   Fluad Quad(high Dose 65+) 02/05/2019   Influenza, High Dose Seasonal PF 04/25/2017, 06/20/2018   Influenza,inj,Quad PF,6+ Mos 03/06/2016   PFIZER Comirnaty(Gray Top)Covid-19 Tri-Sucrose Vaccine 09/07/2020   PFIZER(Purple Top)SARS-COV-2 Vaccination 07/04/2019, 07/29/2019, 09/07/2020   Pneumococcal Conjugate-13 02/05/2019   Pneumococcal Polysaccharide-23 03/06/2016, 04/25/2017   Tdap 03/17/2012   Zoster, Live 03/17/2012   Health Maintenance Due  Topic Date Due   Zoster Vaccines- Shingrix (1 of 2) Never done   OPHTHALMOLOGY EXAM  02/17/2020   COVID-19 Vaccine (4 - Booster for [place] series) 11/02/2020   INFLUENZA VACCINE  12/26/2020   FOOT EXAM  01/11/2021   HEMOGLOBIN A1C  03/17/2021    Last colonoscopy: 04/17/2012 Last PSA: 03/06/21 Dentist: goes every 6 months Ophtho: Dr Marica Otter off of [address]- sees yearly Exercise: walks 20-40 minutes on average 2 days a week  Other doctors caring for patient include:  Dr.Shadad - oncologist sees every other month   Advanced Directives: Does Patient Have a Medical Advance Directive?: Yes Type of Advance Directive: Living will  Depression screen:  See questionnaire below.     Depression  screen [facility] 2/9 03/28/2021 09/15/2020 01/12/2020 06/15/2019 11/20/2018  Decreased Interest 0 0 0 0 0  Down, Depressed, Hopeless 0 0 0 0 0  PHQ - 2 Score 0 0 0 0 0    Fall Screen: See Questionaire below.   Fall Risk  03/28/2021 09/15/2020 01/12/2020 12/22/2019 11/20/2018  Falls in the past year? 1 0 0 0 0  Comment - - - Emmi Telephone Survey: data to providers prior to load -  Number falls in past yr: 1 0 - - -  Comment this september while hiking - - - -  Injury with Fall? 0 0 - - -  Risk for fall due to : No Fall Risks No Fall Risks No Fall Risks - -  Follow up Falls evaluation completed Falls evaluation completed - - -    ADL screen:  See questionnaire below.  Functional Status Survey: Is the patient deaf or have difficulty hearing?: No Does the patient have difficulty seeing, even when wearing glasses/contacts?: No Does the patient have difficulty concentrating, remembering, or making decisions?: No Does the patient have difficulty walking or climbing stairs?: No Does the patient have difficulty dressing or bathing?: Yes (wife assists with shoes due to medications causing fatigue) Does the patient have difficulty doing errands alone such as visiting a doctor's office or shopping?: No   Review of Systems  Constitutional: -, -unexpected weight change, -anorexia, -fatigue Allergy: -sneezing, -itching, -congestion Dermatology: denies changing moles, rash, lumps ENT: -runny nose, -ear pain, -sore throat,  Cardiology:  -chest pain, -palpitations, -orthopnea, Respiratory: -cough, -shortness of breath, -dyspnea  on exertion, -wheezing,  Gastroenterology: -abdominal pain, -nausea, -vomiting, -diarrhea, -constipation, -dysphagia Hematology: -bleeding or bruising problems Musculoskeletal: -arthralgias, -myalgias, -joint swelling, -back pain, - Ophthalmology: -vision changes,  Urology: -dysuria, -difficulty urinating,  -urinary frequency, -urgency, incontinence Neurology: -, -numbness, , -memory  loss, -falls, -dizziness    PHYSICAL EXAM:  There were no vitals taken for this visit.  General Appearance: Alert, cooperative, no distress, appears stated age Head: Normocephalic, without obvious abnormality, atraumatic Eyes: PERRL, conjunctiva/corneas clear, EOM's intact,  Ears: Normal TM's and external ear canals Nose: Nares normal, mucosa normal, no drainage or sinus   tenderness Throat: Lips, mucosa, and tongue normal; teeth and gums normal Neck: Supple, no lymphadenopathy, thyroid:no enlargement/tenderness/nodules; no carotid bruit or JVD Lungs: Clear to auscultation bilaterally without wheezes, rales or ronchi; respirations unlabored Heart: Regular rate and rhythm, S1 and S2 normal, no murmur, rub or gallop Abdomen: Soft, non-tender, nondistended, normoactive bowel sounds, no masses, no hepatosplenomegaly Extremities: No clubbing, cyanosis or edema Pulses: 2+ and symmetric all extremities Skin: Skin color, texture, turgor normal, no rashes or lesions Lymph nodes: Cervical, supraclavicular, and axillary nodes normal Neurologic: CNII-XII intact, normal strength, sensation and gait; reflexes 2+ and symmetric throughout   Psych: Normal mood, affect, hygiene and grooming Hemoglobin A1c is 8.1 ASSESSMENT/PLAN: Routine general medical examination at a health care facility  Need for influenza vaccination - Plan: Flu Vaccine QUAD High Dose(Fluad)  Controlled type 2 diabetes mellitus without complication, without long-term current use of insulin (HCC)  History of prostate cancer  OSA on CPAP  Hyperlipidemia associated with type 2 diabetes mellitus ([place]) - Plan: Lipid panel  Obesity (BMI 30.0-34.9)  History of kidney stones  Malignant neoplasm of prostate metastatic to bone ([place]) - Plan: venlafaxine XR (EFFEXOR XR) 37.5 MG 24 hr capsule  Hot flashes Continue on present medication regimen and I will give Effexor for his hot flashes and see how he does with that. Discussed  kidney stones with him and told him to call me even after hours if he has trouble and I will get him pain medicine and order a CT CT scan. Discussed his diabetes and A1c and because he has not been able to exercise like he would like I recommend that he also work harder on cutting back on carbohydrates. I discussed the CPAP with him and offered my help with any problems that he might have.   Immunization recommendations discussed.  Colonoscopy recommendations reviewed.   Medicare Attestation I have personally reviewed: The patient's medical and social history Their use of alcohol, tobacco or illicit drugs Their current medications and supplements The patient's functional ability including ADLs,fall risks, home safety risks, cognitive, and hearing and visual impairment Diet and physical activities Evidence for depression or mood disorders  The patient's weight, height, and BMI have been recorded in the chart.  I have made referrals, counseling, and provided education to the patient based on review of the above and I have provided the patient with a written personalized care plan for preventive services.    03/29/21 lipids are still not at goal.  I will add Zetia to the regimen. Jill Alexanders, MD   03/28/2021

## 2021-03-29 LAB — LIPID PANEL
Chol/HDL Ratio: 3.1 ratio (ref 0.0–5.0)
Cholesterol, Total: 190 mg/dL (ref 100–199)
HDL: 61 mg/dL (ref >39)
LDL Chol Calc (NIH): 98 mg/dL (ref 0–99)
Triglycerides: 184 mg/dL — ABNORMAL HIGH (ref 0–149)
VLDL Cholesterol Cal: 31 mg/dL (ref 5–40)

## 2021-03-29 MED ORDER — EZETIMIBE 10 MG PO TABS: 10.0000 mg | ORAL_TABLET | Freq: Every day | ORAL | 3 refills | Status: DC

## 2021-03-29 NOTE — Addendum Note (Signed)
Addended by: [name] on: 03/29/2021 07:30 PM   Modules accepted: Orders

## 2021-03-30 ENCOUNTER — Other Ambulatory Visit (HOSPITAL_COMMUNITY): Payer: Self-pay

## 2021-04-13 ENCOUNTER — Other Ambulatory Visit (HOSPITAL_COMMUNITY): Payer: Self-pay

## 2021-04-18 ENCOUNTER — Other Ambulatory Visit (HOSPITAL_COMMUNITY): Payer: Self-pay

## 2021-04-19 ENCOUNTER — Other Ambulatory Visit: Payer: Self-pay | Admitting: Family Medicine

## 2021-04-19 DIAGNOSIS — E119 Type 2 diabetes mellitus without complications: Secondary | ICD-10-CM

## 2021-04-26 DIAGNOSIS — G4733 Obstructive sleep apnea (adult) (pediatric): Secondary | ICD-10-CM | POA: Diagnosis not present

## 2021-05-10 ENCOUNTER — Other Ambulatory Visit: Payer: Self-pay

## 2021-05-10 ENCOUNTER — Inpatient Hospital Stay: Payer: HMO | Attending: Oncology

## 2021-05-10 ENCOUNTER — Other Ambulatory Visit (HOSPITAL_COMMUNITY): Payer: Self-pay

## 2021-05-10 DIAGNOSIS — C7951 Secondary malignant neoplasm of bone: Secondary | ICD-10-CM | POA: Diagnosis not present

## 2021-05-10 DIAGNOSIS — Z923 Personal history of irradiation: Secondary | ICD-10-CM | POA: Diagnosis not present

## 2021-05-10 DIAGNOSIS — M546 Pain in thoracic spine: Secondary | ICD-10-CM | POA: Diagnosis not present

## 2021-05-10 DIAGNOSIS — R5383 Other fatigue: Secondary | ICD-10-CM | POA: Diagnosis not present

## 2021-05-10 DIAGNOSIS — Z79899 Other long term (current) drug therapy: Secondary | ICD-10-CM | POA: Diagnosis not present

## 2021-05-10 DIAGNOSIS — C61 Malignant neoplasm of prostate: Secondary | ICD-10-CM | POA: Diagnosis not present

## 2021-05-10 LAB — CMP (CANCER CENTER ONLY)
ALT: 15 U/L (ref 0–44)
AST: 15 U/L (ref 15–41)
Albumin: 3.7 g/dL (ref 3.5–5.0)
Alkaline Phosphatase: 46 U/L (ref 38–126)
Anion gap: 9 (ref 5–15)
BUN: 14 mg/dL (ref 8–23)
CO2: 22 mmol/L (ref 22–32)
Calcium: 8.8 mg/dL — ABNORMAL LOW (ref 8.9–10.3)
Chloride: 106 mmol/L (ref 98–111)
Creatinine: 0.66 mg/dL (ref 0.61–1.24)
GFR, Estimated: >60 mL/min (ref >60)
Glucose, Bld: 148 mg/dL — ABNORMAL HIGH (ref 70–99)
Potassium: 4.2 mmol/L (ref 3.5–5.1)
Sodium: 137 mmol/L (ref 135–145)
Total Bilirubin: 0.4 mg/dL (ref 0.3–1.2)
Total Protein: 6.8 g/dL (ref 6.5–8.1)

## 2021-05-10 LAB — CBC WITH DIFFERENTIAL (CANCER CENTER ONLY)
Abs Immature Granulocytes: 0.02 10*3/uL (ref 0.00–0.07)
Basophils Absolute: 0 10*3/uL (ref 0.0–0.1)
Basophils Relative: 1 %
Eosinophils Absolute: 0 10*3/uL (ref 0.0–0.5)
Eosinophils Relative: 1 %
HCT: 40.9 % (ref 39.0–52.0)
Hemoglobin: 13.9 g/dL (ref 13.0–17.0)
Immature Granulocytes: 1 %
Lymphocytes Relative: 20 %
Lymphs Abs: 0.9 10*3/uL (ref 0.7–4.0)
MCH: 30.3 pg (ref 26.0–34.0)
MCHC: 34 g/dL (ref 30.0–36.0)
MCV: 89.1 fL (ref 80.0–100.0)
Monocytes Absolute: 0.4 10*3/uL (ref 0.1–1.0)
Monocytes Relative: 9 %
Neutro Abs: 2.8 10*3/uL (ref 1.7–7.7)
Neutrophils Relative %: 68 %
Platelet Count: 225 10*3/uL (ref 150–400)
RBC: 4.59 MIL/uL (ref 4.22–5.81)
RDW: 13.9 % (ref 11.5–15.5)
WBC Count: 4.2 10*3/uL (ref 4.0–10.5)
nRBC: 0 % (ref 0.0–0.2)

## 2021-05-11 LAB — PROSTATE-SPECIFIC AG, SERUM (LABCORP): Prostate Specific Ag, Serum: <0.1 ng/mL (ref 0.0–4.0)

## 2021-05-12 ENCOUNTER — Other Ambulatory Visit: Payer: Self-pay

## 2021-05-12 ENCOUNTER — Inpatient Hospital Stay: Payer: HMO

## 2021-05-12 ENCOUNTER — Other Ambulatory Visit (HOSPITAL_COMMUNITY): Payer: Self-pay

## 2021-05-12 ENCOUNTER — Inpatient Hospital Stay (HOSPITAL_BASED_OUTPATIENT_CLINIC_OR_DEPARTMENT_OTHER): Payer: HMO | Admitting: Oncology

## 2021-05-12 ENCOUNTER — Other Ambulatory Visit: Payer: Self-pay | Admitting: Family Medicine

## 2021-05-12 VITALS — BP 128/82 | HR 95 | Temp 97.2°F | Resp 20 | Ht 67.0 in | Wt 218.2 lb

## 2021-05-12 DIAGNOSIS — C61 Malignant neoplasm of prostate: Secondary | ICD-10-CM

## 2021-05-12 DIAGNOSIS — C7951 Secondary malignant neoplasm of bone: Secondary | ICD-10-CM | POA: Diagnosis not present

## 2021-05-12 DIAGNOSIS — E119 Type 2 diabetes mellitus without complications: Secondary | ICD-10-CM

## 2021-05-12 MED ORDER — DENOSUMAB 120 MG/1.7ML ~~LOC~~ SOLN
120.0000 mg | Freq: Once | SUBCUTANEOUS | Status: AC
  Administered 2021-05-12: 120 mg via SUBCUTANEOUS
  Filled 2021-05-12: qty 1.7

## 2021-05-12 MED ORDER — LEUPROLIDE ACETATE (4 MONTH) 30 MG ~~LOC~~ KIT
30.0000 mg | PACK | Freq: Once | SUBCUTANEOUS | Status: AC
  Administered 2021-05-12: 30 mg via SUBCUTANEOUS
  Filled 2021-05-12: qty 30

## 2021-05-12 NOTE — Progress Notes (Signed)
Hematology and Oncology Follow Up   Wesley White [id number] Sep 19, 1950 70 y.o. 05/12/2021 12:57 PM Wesley White, MDLalonde, Wesley Jarvis, MD     Principle Diagnosis: 70 year old man with advanced prostate cancer with disease to the bone diagnosed in 2020 after initially presented with PSA was 10.4 and a Gleason score 4+3 = 7 in 2017.  He has castration-resistant disease.  Prior Therapy:   He is status post laparoscopic radical prostatectomy and pelvic lymphadenectomy on March 05, 2016.  The final pathology showed Gleason score 4+3 equal 7 with 0 out of 9 lymph node involved.  He did have extraprostatic extension at that time.   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  He is status post radiation therapy adjuvantly under the care of Dr. Tammi Klippel completed on May 4 of 2018.  He received 68.4 Gray in 38 fractions.  Status post radiation therapy for isolated left iliac bone metastasis completed in June 2020.  Zytiga 1000 mg daily started in August 2020.  Therapy discontinued because of lack of coverage   He developed worsening back pain and a rise in his PSA up to 2.7 with MRI showed worsening disease in the spine including T12, T1 and T5.  He is status post radiation therapy to the T11 and L4 completed on February 04, 2020 after completing 30 Gray in 10 fractions.   Current therapy:   Eligard 30 mg every 4 months.  This will be given today and repeated in 4 months.  Xgeva 120 mg every 2 months.  He will receive injection today.  Xtandi 160 mg daily started on January 20, 2020    Interim History: [name] returns today for repeat evaluation.  Since the last visit, he reports feeling well without any major complaints.  He denies any complications related to [facility].  He has reported some mild fatigue and tiredness but no other issues.  He denies any bone pain or pathological fractures.  He denies any hospitalizations or  illnesses.              Medications: Reviewed without changes. Current Outpatient Medications  Medication Sig Dispense Refill   acetaminophen (TYLENOL) 325 MG tablet Take 650 mg by mouth every 6 (six) hours as needed for mild pain.      enzalutamide (XTANDI) 40 MG tablet TAKE 4 TABLETS (160 MG TOTAL) BY MOUTH DAILY. 120 tablet 3   ezetimibe (ZETIA) 10 MG tablet Take 1 tablet (10 mg total) by mouth daily. 90 tablet 3   ibuprofen (ADVIL) 600 MG tablet Take 600 mg by mouth every 6 (six) hours as needed. (Patient not taking: No sig reported)     INVOKANA 300 MG TABS tablet TAKE 1 TABLET BY MOUTH EVERY DAY BEFORE BREAKFAST 90 tablet 1   lisinopril (ZESTRIL) 5 MG tablet TAKE 1 TABLET BY MOUTH EVERY DAY 90 tablet 3   metFORMIN (GLUCOPHAGE) 500 MG tablet TAKE 1 TABLET BY MOUTH TWICE A DAY WITH MEALS 180 tablet 1   naproxen sodium (ALEVE) 220 MG tablet Take 220 mg by mouth daily as needed.     OneTouch Delica Lancets 60V MISC See admin instructions.     ONETOUCH VERIO test strip USE AS DIRECTED AS NEEDED 100 strip 11   rosuvastatin (CRESTOR) 40 MG tablet Take 1 tablet (40 mg total) by mouth daily. 90 tablet 3   venlafaxine XR (EFFEXOR XR) 37.5 MG 24 hr capsule Take 1 capsule (37.5 mg total) by mouth daily with breakfast. 30 capsule  1   No current facility-administered medications for this visit.   Facility-Administered Medications Ordered in Other Visits  Medication Dose Route Frequency Provider Last Rate Last Admin   magnesium citrate solution 1 Bottle  1 Bottle Oral Once Raynelle Bring, MD       sodium phosphate (FLEET) 7-19 GM/118ML enema 1 enema  1 enema Rectal Once Raynelle Bring, MD           Physical Exam:    Blood pressure 128/82, pulse 95, temperature (!) 97.2 F (36.2 C), temperature source Temporal, resp. rate 20, height 5\' 7"  (1.702 m), weight 218 lb 3.2 oz (99 kg), SpO2 97 %.          ECOG: 1    General appearance: Alert, awake without any  distress. Head: Atraumatic without abnormalities Oropharynx: Without any thrush or ulcers. Eyes: No scleral icterus. Lymph nodes: No lymphadenopathy noted in the cervical, supraclavicular, or axillary nodes Heart:regular rate and rhythm, without any murmurs or gallops.   White: Clear to auscultation without any rhonchi, wheezes or dullness to percussion. Abdomin: Soft, nontender without any shifting dullness or ascites. Musculoskeletal: No clubbing or cyanosis. Neurological: No motor or sensory deficits. Skin: No rashes or lesions.                 Lab Results: Lab Results  Component Value Date   WBC 4.2 05/10/2021   HGB 13.9 05/10/2021   HCT 40.9 05/10/2021   MCV 89.1 05/10/2021   PLT 225 05/10/2021     Chemistry      Component Value Date/Time   NA 137 05/10/2021 0906   NA 136 10/17/2018 1139   K 4.2 05/10/2021 0906   CL 106 05/10/2021 0906   CO2 22 05/10/2021 0906   BUN 14 05/10/2021 0906   BUN 11 10/17/2018 1139   CREATININE 0.66 05/10/2021 0906   CREATININE 0.81 06/08/2015 0001      Component Value Date/Time   CALCIUM 8.8 (L) 05/10/2021 0906   ALKPHOS 46 05/10/2021 0906   AST 15 05/10/2021 0906   ALT 15 05/10/2021 0906   BILITOT 0.4 05/10/2021 0906           Latest Reference Range & Units 01/03/21 10:55 03/06/21 14:55 05/10/21 09:06  Prostate Specific Ag, Serum 0.0 - 4.0 ng/mL <0.1 <0.1 <0.1       Impression and Plan:   70 year old man with:  1.  Advanced prostate cancer with disease to the bone diagnosed in 2020.  He has not castration-resistant disease at this time.  He has tolerated Xtandi reasonably well without any complaints.  His PSA continues to be undetectable.  Risks and benefits of continuing this treatment were discussed at this time.  Potential complications include nausea, fatigue, seizures were reiterated.  Alternative treatment options such as systemic chemotherapy were also reviewed.  He is agreeable to continue at this  time.    2.  Back pain: No issues reported at this time.  His pain is related to  3.  Androgen deprivation: He will receive Eligard today and repeated in 4 months.  Complication clinic weight gain, hot flashes were discussed.  4.  Bone directed therapy: He will receive Xgeva today and repeated in 2 months.  Complication include hypocalcemia, osteonecrosis of the jaw were reviewed.   5.  Prognosis and goals of care: His disease is incurable although aggressive measures are warranted given his reasonable performance status.  6.  Follow-up: In 2 months for a follow-up evaluation.  30  minutes  were dedicated to this visit.  The time was spent on reviewing laboratory data, disease status update, treatment choices and future plan of care discussion.   Zola Button, MD 05/12/2021 12:57 PM

## 2021-05-12 NOTE — Patient Instructions (Signed)
Denosumab injection What is this medication? DENOSUMAB (den oh sue mab) slows bone breakdown. Prolia is used to treat osteoporosis in women after menopause and in men, and in people who are taking corticosteroids for 6 months or more. Delton See is used to treat a high calcium level due to cancer and to prevent bone fractures and other bone problems caused by multiple myeloma or cancer bone metastases. Delton See is also used to treat giant cell tumor of the bone. This medicine may be used for other purposes; ask your health care provider or pharmacist if you have questions. COMMON BRAND NAME(S): Prolia, XGEVA What should I tell my care team before I take this medication? They need to know if you have any of these conditions: dental disease having surgery or tooth extraction infection kidney disease low levels of calcium or Vitamin D in the blood malnutrition on hemodialysis skin conditions or sensitivity thyroid or parathyroid disease an unusual reaction to denosumab, other medicines, foods, dyes, or preservatives pregnant or trying to get pregnant breast-feeding How should I use this medication? This medicine is for injection under the skin. It is given by a health care professional in a hospital or clinic setting. A special MedGuide will be given to you before each treatment. Be sure to read this information carefully each time. For Prolia, talk to your pediatrician regarding the use of this medicine in children. Special care may be needed. For Delton See, talk to your pediatrician regarding the use of this medicine in children. While this drug may be prescribed for children as young as 13 years for selected conditions, precautions do apply. Overdosage: If you think you have taken too much of this medicine contact a poison control center or emergency room at once. NOTE: This medicine is only for you. Do not share this medicine with others. What if I miss a dose? It is important not to miss your dose.  Call your doctor or health care professional if you are unable to keep an appointment. What may interact with this medication? Do not take this medicine with any of the following medications: other medicines containing denosumab This medicine may also interact with the following medications: medicines that lower your chance of fighting infection steroid medicines like prednisone or cortisone This list may not describe all possible interactions. Give your health care provider a list of all the medicines, herbs, non-prescription drugs, or dietary supplements you use. Also tell them if you smoke, drink alcohol, or use illegal drugs. Some items may interact with your medicine. What should I watch for while using this medication? Visit your doctor or health care professional for regular checks on your progress. Your doctor or health care professional may order blood tests and other tests to see how you are doing. Call your doctor or health care professional for advice if you get a fever, chills or sore throat, or other symptoms of a cold or flu. Do not treat yourself. This drug may decrease your body's ability to fight infection. Try to avoid being around people who are sick. You should make sure you get enough calcium and vitamin D while you are taking this medicine, unless your doctor tells you not to. Discuss the foods you eat and the vitamins you take with your health care professional. See your dentist regularly. Brush and floss your teeth as directed. Before you have any dental work done, tell your dentist you are receiving this medicine. Do not become pregnant while taking this medicine or for 5 months after  stopping it. Talk with your doctor or health care professional about your birth control options while taking this medicine. Women should inform their doctor if they wish to become pregnant or think they might be pregnant. There is a potential for serious side effects to an unborn child. Talk to  your health care professional or pharmacist for more information. What side effects may I notice from receiving this medication? Side effects that you should report to your doctor or health care professional as soon as possible: allergic reactions like skin rash, itching or hives, swelling of the face, lips, or tongue bone pain breathing problems dizziness jaw pain, especially after dental work redness, blistering, peeling of the skin signs and symptoms of infection like fever or chills; cough; sore throat; pain or trouble passing urine signs of low calcium like fast heartbeat, muscle cramps or muscle pain; pain, tingling, numbness in the hands or feet; seizures unusual bleeding or bruising unusually weak or tired Side effects that usually do not require medical attention (report to your doctor or health care professional if they continue or are bothersome): constipation diarrhea headache joint pain loss of appetite muscle pain runny nose tiredness upset stomach This list may not describe all possible side effects. Call your doctor for medical advice about side effects. You may report side effects to FDA at 1-800-FDA-1088. Where should I keep my medication? This medicine is only given in a clinic, doctor's office, or other health care setting and will not be stored at home. NOTE: This sheet is a summary. It may not cover all possible information. If you have questions about this medicine, talk to your doctor, pharmacist, or health care provider. Leuprolide depot injection What is this medication? LEUPROLIDE (loo PROE lide) is a man-made protein that acts like a natural hormone in the body. It decreases testosterone in men and decreases estrogen in women. In men, this medicine is used to treat advanced prostate cancer. In women, some forms of this medicine may be used to treat endometriosis, uterine fibroids, or other male hormone-related problems. This medicine may be used for other  purposes; ask your health care provider or pharmacist if you have questions. COMMON BRAND NAME(S): Eligard, Fensolv, Lupron Depot, Lupron Depot-Ped, Viadur What should I tell my care team before I take this medication? They need to know if you have any of these conditions: diabetes heart disease or previous heart attack high blood pressure high cholesterol mental illness osteoporosis pain or difficulty passing urine seizures spinal cord metastasis stroke suicidal thoughts, plans, or attempt; a previous suicide attempt by you or a family member tobacco smoker unusual vaginal bleeding (women) an unusual or allergic reaction to leuprolide, benzyl alcohol, other medicines, foods, dyes, or preservatives pregnant or trying to get pregnant breast-feeding How should I use this medication? This medicine is for injection into a muscle or for injection under the skin. It is given by a health care professional in a hospital or clinic setting. The specific product will determine how it will be given to you. Make sure you understand which product you receive and how often you will receive it. Talk to your pediatrician regarding the use of this medicine in children. Special care may be needed. Overdosage: If you think you have taken too much of this medicine contact a poison control center or emergency room at once. NOTE: This medicine is only for you. Do not share this medicine with others. What if I miss a dose? It is important not to miss a  dose. Call your doctor or health care professional if you are unable to keep an appointment. Depot injections: Depot injections are given either once-monthly, every 12 weeks, every 16 weeks, or every 24 weeks depending on the product you are prescribed. The product you are prescribed will be based on if you are male or male, and your condition. Make sure you understand your product and dosing. What may interact with this medication? Do not take this medicine  with any of the following medications: chasteberry cisapride dronedarone pimozide thioridazine This medicine may also interact with the following medications: herbal or dietary supplements, like black cohosh or DHEA male hormones, like estrogens or progestins and birth control pills, patches, rings, or injections male hormones, like testosterone other medicines that prolong the QT interval (abnormal heart rhythm) This list may not describe all possible interactions. Give your health care provider a list of all the medicines, herbs, non-prescription drugs, or dietary supplements you use. Also tell them if you smoke, drink alcohol, or use illegal drugs. Some items may interact with your medicine. What should I watch for while using this medication? Visit your doctor or health care professional for regular checks on your progress. During the first weeks of treatment, your symptoms may get worse, but then will improve as you continue your treatment. You may get hot flashes, increased bone pain, increased difficulty passing urine, or an aggravation of nerve symptoms. Discuss these effects with your doctor or health care professional, some of them may improve with continued use of this medicine. Male patients may experience a menstrual cycle or spotting during the first months of therapy with this medicine. If this continues, contact your doctor or health care professional. This medicine may increase blood sugar. Ask your healthcare provider if changes in diet or medicines are needed if you have diabetes. What side effects may I notice from receiving this medication? Side effects that you should report to your doctor or health care professional as soon as possible: allergic reactions like skin rash, itching or hives, swelling of the face, lips, or tongue breathing problems chest pain depression or memory disorders pain in your legs or groin pain at site where injected or  implanted seizures severe headache signs and symptoms of high blood sugar such as being more thirsty or hungry or having to urinate more than normal. You may also feel very tired or have blurry vision swelling of the feet and legs suicidal thoughts or other mood changes visual changes vomiting Side effects that usually do not require medical attention (report to your doctor or health care professional if they continue or are bothersome): breast swelling or tenderness decrease in sex drive or performance diarrhea hot flashes loss of appetite muscle, joint, or bone pains nausea redness or irritation at site where injected or implanted skin problems or acne This list may not describe all possible side effects. Call your doctor for medical advice about side effects. You may report side effects to FDA at 1-800-FDA-1088. Where should I keep my medication? This drug is given in a hospital or clinic and will not be stored at home. NOTE: This sheet is a summary. It may not cover all possible information. If you have questions about this medicine, talk to your doctor, pharmacist, or health care provider.  2022 Elsevier/Gold Standard (2021-01-31 00:00:00)   2022 Elsevier/Gold Standard (2017-09-20 00:00:00)

## 2021-05-15 ENCOUNTER — Other Ambulatory Visit (HOSPITAL_COMMUNITY): Payer: Self-pay

## 2021-05-17 ENCOUNTER — Other Ambulatory Visit (HOSPITAL_COMMUNITY): Payer: Self-pay

## 2021-05-22 ENCOUNTER — Other Ambulatory Visit: Payer: Self-pay | Admitting: Family Medicine

## 2021-05-22 DIAGNOSIS — C7951 Secondary malignant neoplasm of bone: Secondary | ICD-10-CM

## 2021-05-23 NOTE — Telephone Encounter (Signed)
[facility] is requesting to fill pt effexor. Please advise kH

## 2021-05-26 DIAGNOSIS — G4733 Obstructive sleep apnea (adult) (pediatric): Secondary | ICD-10-CM | POA: Diagnosis not present

## 2021-05-28 DIAGNOSIS — G4733 Obstructive sleep apnea (adult) (pediatric): Secondary | ICD-10-CM | POA: Diagnosis not present

## 2021-05-30 DIAGNOSIS — L814 Other melanin hyperpigmentation: Secondary | ICD-10-CM | POA: Diagnosis not present

## 2021-05-30 DIAGNOSIS — L218 Other seborrheic dermatitis: Secondary | ICD-10-CM | POA: Diagnosis not present

## 2021-05-30 DIAGNOSIS — L570 Actinic keratosis: Secondary | ICD-10-CM | POA: Diagnosis not present

## 2021-05-30 DIAGNOSIS — L819 Disorder of pigmentation, unspecified: Secondary | ICD-10-CM | POA: Diagnosis not present

## 2021-05-30 DIAGNOSIS — D225 Melanocytic nevi of trunk: Secondary | ICD-10-CM | POA: Diagnosis not present

## 2021-05-30 DIAGNOSIS — L821 Other seborrheic keratosis: Secondary | ICD-10-CM | POA: Diagnosis not present

## 2021-05-30 DIAGNOSIS — L853 Xerosis cutis: Secondary | ICD-10-CM | POA: Diagnosis not present

## 2021-05-31 ENCOUNTER — Ambulatory Visit (INDEPENDENT_AMBULATORY_CARE_PROVIDER_SITE_OTHER): Payer: HMO

## 2021-05-31 ENCOUNTER — Other Ambulatory Visit: Payer: Self-pay

## 2021-05-31 DIAGNOSIS — Z23 Encounter for immunization: Secondary | ICD-10-CM

## 2021-06-06 ENCOUNTER — Encounter: Payer: Self-pay | Admitting: Oncology

## 2021-06-08 ENCOUNTER — Other Ambulatory Visit: Payer: Self-pay

## 2021-06-08 ENCOUNTER — Encounter: Payer: Self-pay | Admitting: Oncology

## 2021-06-08 ENCOUNTER — Ambulatory Visit (INDEPENDENT_AMBULATORY_CARE_PROVIDER_SITE_OTHER): Payer: HMO | Admitting: Family Medicine

## 2021-06-08 VITALS — BP 104/68 | HR 83 | Temp 97.0°F | Wt 222.4 lb

## 2021-06-08 DIAGNOSIS — R519 Headache, unspecified: Secondary | ICD-10-CM | POA: Diagnosis not present

## 2021-06-08 NOTE — Patient Instructions (Signed)
take 2 Tylenol 4 times per day and see if that makes a difference.  If that continues call me and I will refer you to ophthalmology

## 2021-06-08 NOTE — Progress Notes (Signed)
° °  Subjective:    Patient ID: Wesley White, male    DOB: 05/20/1951, 71 y.o.   MRN: [id number]  HPI He is here for evaluation of headache.  He has had this for 1 week and he states that it alternates between the left and the right eye above the eyes.  It sharp made worse if he looks upward or moves his head no blurred or double vision, nausea or vomiting.  It can occur several times per day.  He did get a COVID-vaccine on January 4 and several days after that is when the headache started.  No sneezing, itchy watery eyes, nasal congestion.   Review of Systems     Objective:   Physical Exam Alert and in no distress.  PERRL EOMI.  Other cranial nerves grossly intact.  TMs clear.  Neck is supple without adenopathy or thyromegaly.  Conjunctive appear normal       Assessment & Plan:

## 2021-06-09 ENCOUNTER — Other Ambulatory Visit: Payer: Self-pay | Admitting: Oncology

## 2021-06-09 ENCOUNTER — Other Ambulatory Visit (HOSPITAL_COMMUNITY): Payer: Self-pay

## 2021-06-09 MED ORDER — ENZALUTAMIDE 40 MG PO TABS
ORAL_TABLET | ORAL | 3 refills | Status: DC
  Filled 2021-06-09: qty 120, 30d supply, fill #0
  Filled 2021-07-17: qty 120, 30d supply, fill #1
  Filled 2021-08-04: qty 120, 30d supply, fill #2
  Filled 2021-09-05: qty 120, 30d supply, fill #3

## 2021-06-14 ENCOUNTER — Encounter: Payer: Self-pay | Admitting: Oncology

## 2021-06-14 ENCOUNTER — Other Ambulatory Visit (HOSPITAL_COMMUNITY): Payer: Self-pay

## 2021-06-20 DIAGNOSIS — H35033 Hypertensive retinopathy, bilateral: Secondary | ICD-10-CM | POA: Diagnosis not present

## 2021-06-20 DIAGNOSIS — G4733 Obstructive sleep apnea (adult) (pediatric): Secondary | ICD-10-CM | POA: Diagnosis not present

## 2021-06-20 DIAGNOSIS — H524 Presbyopia: Secondary | ICD-10-CM | POA: Diagnosis not present

## 2021-06-20 DIAGNOSIS — H5203 Hypermetropia, bilateral: Secondary | ICD-10-CM | POA: Diagnosis not present

## 2021-06-20 DIAGNOSIS — H11152 Pinguecula, left eye: Secondary | ICD-10-CM | POA: Diagnosis not present

## 2021-06-20 DIAGNOSIS — H25813 Combined forms of age-related cataract, bilateral: Secondary | ICD-10-CM | POA: Diagnosis not present

## 2021-06-20 DIAGNOSIS — E119 Type 2 diabetes mellitus without complications: Secondary | ICD-10-CM | POA: Diagnosis not present

## 2021-06-20 DIAGNOSIS — H52221 Regular astigmatism, right eye: Secondary | ICD-10-CM | POA: Diagnosis not present

## 2021-06-20 DIAGNOSIS — H2513 Age-related nuclear cataract, bilateral: Secondary | ICD-10-CM | POA: Diagnosis not present

## 2021-06-26 DIAGNOSIS — G4733 Obstructive sleep apnea (adult) (pediatric): Secondary | ICD-10-CM | POA: Diagnosis not present

## 2021-07-11 ENCOUNTER — Other Ambulatory Visit (HOSPITAL_COMMUNITY): Payer: Self-pay

## 2021-07-13 ENCOUNTER — Ambulatory Visit (INDEPENDENT_AMBULATORY_CARE_PROVIDER_SITE_OTHER): Payer: HMO | Admitting: Family Medicine

## 2021-07-13 ENCOUNTER — Other Ambulatory Visit: Payer: Self-pay

## 2021-07-13 VITALS — BP 118/78 | HR 83 | Temp 97.0°F | Wt 220.0 lb

## 2021-07-13 DIAGNOSIS — E785 Hyperlipidemia, unspecified: Secondary | ICD-10-CM | POA: Diagnosis not present

## 2021-07-13 DIAGNOSIS — N5231 Erectile dysfunction following radical prostatectomy: Secondary | ICD-10-CM

## 2021-07-13 DIAGNOSIS — E1169 Type 2 diabetes mellitus with other specified complication: Secondary | ICD-10-CM | POA: Diagnosis not present

## 2021-07-13 DIAGNOSIS — Z8546 Personal history of malignant neoplasm of prostate: Secondary | ICD-10-CM | POA: Diagnosis not present

## 2021-07-13 DIAGNOSIS — E669 Obesity, unspecified: Secondary | ICD-10-CM

## 2021-07-13 DIAGNOSIS — I700 Atherosclerosis of aorta: Secondary | ICD-10-CM

## 2021-07-13 DIAGNOSIS — E119 Type 2 diabetes mellitus without complications: Secondary | ICD-10-CM

## 2021-07-13 LAB — POCT GLYCOSYLATED HEMOGLOBIN (HGB A1C): Hemoglobin A1C: 8.1 % — AB (ref 4.0–5.6)

## 2021-07-13 MED ORDER — TADALAFIL 20 MG PO TABS: 20.0000 mg | ORAL_TABLET | Freq: Every day | ORAL | 1 refills | Status: DC | PRN

## 2021-07-13 NOTE — Progress Notes (Signed)
Subjective:    Patient ID: Wesley White, male    DOB: Oct 15, 1950, 71 y.o.   MRN: [id number]  Wesley White is a 71 y.o. male who presents for follow-up of Type 2 diabetes mellitus.  Home blood sugar records:  post meal highest reading 178 Current symptoms/problems include none and have been stable. Daily foot checks:yes   Any foot concerns: none  Exercise:  walking and staying active Diet: good He is followed regularly by urology/oncology.  Presently he is on [place] as well as Xgeva.  His last PSA was less than 0.1.  He stopped taking the Effexor as he was having difficulty with bragged fog from that.  He also stopped Zetia for the same reason.  He would like to try Cialis again to help with his ED.  He does continue on metformin as well as Invokana but admits to dietary indiscretion over the last several months.  Continues on rosuvastatin as well as lisinopril.  The lisinopril is mainly for kidney preservation The following portions of the patient's history were reviewed and updated as appropriate: allergies, current medications, past medical history, past social history and problem list.  ROS as in subjective above.     Objective:    Physical Exam Alert and in no distress otherwise not examined. Hemoglobin A1c is 8.1  Lab Review Diabetic Labs Latest Ref Rng & Units 05/10/2021 03/28/2021 03/06/2021 01/03/2021 10/26/2020  HbA1c 4.0 - 5.6 % - - - - -  Microalbumin mg/L - - - - -  Micro/Creat Ratio - - - - - -  Chol 100 - 199 mg/dL - 190 - - -  HDL >39 mg/dL - 61 - - -  Calc LDL 0 - 99 mg/dL - 98 - - -  Triglycerides 0 - 149 mg/dL - 184(H) - - -  Creatinine 0.61 - 1.24 mg/dL 0.66 - 0.76 0.77 0.78   BP/Weight 06/08/2021 05/12/2021 03/28/2021 03/08/2021 74/0/8144  Systolic BP 818 563 149 702 -  Diastolic BP 68 82 64 74 -  Wt. (Lbs) 222.4 218.2 215.4 218.3 220  BMI 34.83 34.17 33.74 34.19 34.46   Foot/eye exam completion dates Latest Ref Rng & Units 01/12/2020 02/17/2019  Eye Exam No  Retinopathy - No Retinopathy  Foot Form Completion - Done -    Wesley White  reports that he has never smoked. He has never used smokeless tobacco. He reports current alcohol use. He reports that he does not use drugs.     Assessment & Plan:    Controlled type 2 diabetes mellitus without complication, without long-term current use of insulin (HCC) - Plan: POCT glycosylated hemoglobin (Hb A1C)  History of prostate cancer  Obesity (BMI 30.0-34.9)  Hyperlipidemia associated with type 2 diabetes mellitus ([place])  Aortic atherosclerosis (HCC)  Erectile dysfunction after radical prostatectomy - Plan: tadalafil (CIALIS) 20 MG tablet   Rx changes:  Cialis was called in.  He will keep [place] informed as to whether it is any use. Education: Reviewed ABCs of diabetes management (respective goals in parentheses):  A1C (<7), blood pressure (<130/80), and cholesterol (LDL <100). Compliance at present is estimated to be fair. Efforts to improve compliance (if necessary) will be directed at increased exercise. Follow up: 4 months I did not change any of his medications as his A1c was elevated that he admits to not being compliant with diet and exercise.  At the end of the encounter he mention something about an intermittent headache occurring on either side around the elbow.  Recommend that  he keep track of this and use Tylenol.  We evaluated as needed for that.

## 2021-07-14 ENCOUNTER — Telehealth: Payer: Self-pay | Admitting: Oncology

## 2021-07-14 NOTE — Telephone Encounter (Signed)
Updated pts, pt is arawe

## 2021-07-17 ENCOUNTER — Other Ambulatory Visit (HOSPITAL_COMMUNITY): Payer: Self-pay

## 2021-07-18 ENCOUNTER — Other Ambulatory Visit: Payer: HMO

## 2021-07-18 ENCOUNTER — Inpatient Hospital Stay: Payer: HMO | Attending: Oncology

## 2021-07-18 ENCOUNTER — Other Ambulatory Visit (HOSPITAL_COMMUNITY): Payer: Self-pay

## 2021-07-18 ENCOUNTER — Other Ambulatory Visit: Payer: Self-pay

## 2021-07-18 ENCOUNTER — Ambulatory Visit: Payer: HMO | Admitting: Oncology

## 2021-07-18 DIAGNOSIS — M546 Pain in thoracic spine: Secondary | ICD-10-CM | POA: Diagnosis not present

## 2021-07-18 DIAGNOSIS — Z79899 Other long term (current) drug therapy: Secondary | ICD-10-CM | POA: Diagnosis not present

## 2021-07-18 DIAGNOSIS — G893 Neoplasm related pain (acute) (chronic): Secondary | ICD-10-CM | POA: Diagnosis not present

## 2021-07-18 DIAGNOSIS — C61 Malignant neoplasm of prostate: Secondary | ICD-10-CM | POA: Insufficient documentation

## 2021-07-18 DIAGNOSIS — Z923 Personal history of irradiation: Secondary | ICD-10-CM | POA: Diagnosis not present

## 2021-07-18 DIAGNOSIS — C7951 Secondary malignant neoplasm of bone: Secondary | ICD-10-CM | POA: Diagnosis not present

## 2021-07-18 LAB — CBC WITH DIFFERENTIAL (CANCER CENTER ONLY)
Abs Immature Granulocytes: 0.03 10*3/uL (ref 0.00–0.07)
Basophils Absolute: 0 10*3/uL (ref 0.0–0.1)
Basophils Relative: 1 %
Eosinophils Absolute: 0 10*3/uL (ref 0.0–0.5)
Eosinophils Relative: 0 %
HCT: 41.2 % (ref 39.0–52.0)
Hemoglobin: 13.6 g/dL (ref 13.0–17.0)
Immature Granulocytes: 1 %
Lymphocytes Relative: 17 %
Lymphs Abs: 0.8 10*3/uL (ref 0.7–4.0)
MCH: 29.8 pg (ref 26.0–34.0)
MCHC: 33 g/dL (ref 30.0–36.0)
MCV: 90.2 fL (ref 80.0–100.0)
Monocytes Absolute: 0.4 10*3/uL (ref 0.1–1.0)
Monocytes Relative: 9 %
Neutro Abs: 3.5 10*3/uL (ref 1.7–7.7)
Neutrophils Relative %: 72 %
Platelet Count: 236 10*3/uL (ref 150–400)
RBC: 4.57 MIL/uL (ref 4.22–5.81)
RDW: 13.9 % (ref 11.5–15.5)
WBC Count: 4.8 10*3/uL (ref 4.0–10.5)
nRBC: 0 % (ref 0.0–0.2)

## 2021-07-18 LAB — CMP (CANCER CENTER ONLY)
ALT: 14 U/L (ref 0–44)
AST: 12 U/L — ABNORMAL LOW (ref 15–41)
Albumin: 4.1 g/dL (ref 3.5–5.0)
Alkaline Phosphatase: 47 U/L (ref 38–126)
Anion gap: 6 (ref 5–15)
BUN: 17 mg/dL (ref 8–23)
CO2: 26 mmol/L (ref 22–32)
Calcium: 9.1 mg/dL (ref 8.9–10.3)
Chloride: 105 mmol/L (ref 98–111)
Creatinine: 0.59 mg/dL — ABNORMAL LOW (ref 0.61–1.24)
GFR, Estimated: >60 mL/min (ref >60)
Glucose, Bld: 170 mg/dL — ABNORMAL HIGH (ref 70–99)
Potassium: 4.5 mmol/L (ref 3.5–5.1)
Sodium: 137 mmol/L (ref 135–145)
Total Bilirubin: 0.4 mg/dL (ref 0.3–1.2)
Total Protein: 6.5 g/dL (ref 6.5–8.1)

## 2021-07-19 LAB — PROSTATE-SPECIFIC AG, SERUM (LABCORP): Prostate Specific Ag, Serum: <0.1 ng/mL (ref 0.0–4.0)

## 2021-07-20 ENCOUNTER — Other Ambulatory Visit (HOSPITAL_COMMUNITY): Payer: Self-pay

## 2021-07-20 ENCOUNTER — Ambulatory Visit: Payer: HMO | Admitting: Oncology

## 2021-07-20 ENCOUNTER — Inpatient Hospital Stay: Payer: HMO | Admitting: Oncology

## 2021-07-20 ENCOUNTER — Inpatient Hospital Stay: Payer: HMO

## 2021-07-20 ENCOUNTER — Ambulatory Visit: Payer: HMO

## 2021-07-20 ENCOUNTER — Other Ambulatory Visit: Payer: Self-pay

## 2021-07-20 VITALS — BP 109/77 | HR 71 | Temp 97.3°F | Resp 20 | Wt 221.0 lb

## 2021-07-20 DIAGNOSIS — C61 Malignant neoplasm of prostate: Secondary | ICD-10-CM

## 2021-07-20 DIAGNOSIS — C7951 Secondary malignant neoplasm of bone: Secondary | ICD-10-CM

## 2021-07-20 MED ORDER — DENOSUMAB 120 MG/1.7ML ~~LOC~~ SOLN
120.0000 mg | Freq: Once | SUBCUTANEOUS | Status: AC
  Administered 2021-07-20: 120 mg via SUBCUTANEOUS
  Filled 2021-07-20: qty 1.7

## 2021-07-20 NOTE — Progress Notes (Signed)
Hematology and Oncology Follow Up   Marx Doig [id number] [birth year] 71 y.o. 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  He was diagnosed in 2017 with PSA  10.4 and a Gleason score 4+3 = 7 in 2017 and localized disease.  Prior Therapy:   He is status post laparoscopic radical prostatectomy and pelvic lymphadenectomy on March 05, 2016.  The final pathology showed Gleason score 4+3 equal 7 with 0 out of 9 lymph node involved.  He did have extraprostatic extension at that time.   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  He is status post radiation therapy adjuvantly under the care of Dr. Tammi Klippel completed on May 4 of 2018.  He received 68.4 Gray in 38 fractions.  Status post radiation therapy for isolated left iliac bone metastasis completed in June 2020.  Zytiga 1000 mg daily started in August 2020.  Therapy discontinued because of lack of coverage   He developed worsening back pain and a rise in his PSA up to 2.7 with MRI showed worsening disease in the spine including T12, T1 and T5.  He is status post radiation therapy to the T11 and L4 completed on February 04, 2020 after completing 30 Gray in 10 fractions.   Current therapy:   Eligard 30 mg every 4 months.  Next injection will be given in April 2023.  Xgeva 120 mg every 2 months.  He will receive injection today and repeat in 2 months.  Xtandi 160 mg daily started on January 20, 2020    Interim History: Mr. Fredin presents today for a follow-up visit.  Since her last visit, he reports of feeling well without any major complaints.  He denies any nausea, vomiting or abdominal pain.  He denied any bone pain or pathological fractures.  He denies any recent complications related to [facility].  He denied any excessive fatigue or tiredness.  He denies any bone  pain or pathological fractures.  He does report occasional headaches or occasional but not debilitating.  Performance status quality of life remained excellent.              Medications: Reviewed without changes. Current Outpatient Medications  Medication Sig Dispense Refill   acetaminophen (TYLENOL) 325 MG tablet Take 650 mg by mouth every 6 (six) hours as needed for mild pain.      enzalutamide (XTANDI) 40 MG tablet TAKE 4 TABLETS (160 MG TOTAL) BY MOUTH DAILY. 120 tablet 3   ezetimibe (ZETIA) 10 MG tablet Take 1 tablet (10 mg total) by mouth daily. (Patient not taking: Reported on 06/08/2021) 90 tablet 3   ibuprofen (ADVIL) 600 MG tablet Take 600 mg by mouth every 6 (six) hours as needed.     INVOKANA 300 MG TABS tablet TAKE 1 TABLET BY MOUTH EVERY DAY BEFORE BREAKFAST 90 tablet 1   lisinopril (ZESTRIL) 5 MG tablet TAKE 1 TABLET BY MOUTH EVERY DAY 90 tablet 3   metFORMIN (GLUCOPHAGE) 500 MG tablet TAKE 1 TABLET BY MOUTH TWICE A DAY WITH MEALS 180 tablet 1   naproxen sodium (ALEVE) 220 MG tablet Take 220 mg by mouth daily as needed.     OneTouch Delica Lancets 44W MISC See admin instructions.     ONETOUCH VERIO test strip USE AS DIRECTED AS NEEDED 100 strip 11   rosuvastatin (CRESTOR) 40 MG tablet Take 1 tablet (  40 mg total) by mouth daily. 90 tablet 3   tadalafil (CIALIS) 20 MG tablet Take 1 tablet (20 mg total) by mouth daily as needed for erectile dysfunction. 30 tablet 1   venlafaxine XR (EFFEXOR-XR) 37.5 MG 24 hr capsule TAKE 1 CAPSULE BY MOUTH DAILY WITH BREAKFAST. (Patient not taking: Reported on 06/08/2021) 30 capsule 1   No current facility-administered medications for this visit.   Facility-Administered Medications Ordered in Other Visits  Medication Dose Route Frequency Provider Last Rate Last Admin   magnesium citrate solution 1 Bottle  1 Bottle Oral Once Raynelle Bring, MD       sodium phosphate (FLEET) 7-19 GM/118ML enema 1 enema  1 enema Rectal Once Raynelle Bring,  MD           Physical Exam:        Blood pressure 109/77, pulse 71, temperature (!) 97.3 F (36.3 C), resp. rate 20, weight 221 lb (100.2 kg), SpO2 97 %.       ECOG: 1   General appearance: Comfortable appearing without any discomfort Head: Normocephalic without any trauma Oropharynx: Mucous membranes are moist and pink without any thrush or ulcers. Eyes: Pupils are equal and round reactive to light. Lymph nodes: No cervical, supraclavicular, inguinal or axillary lymphadenopathy.   Heart:regular rate and rhythm.  S1 and S2 without leg edema. Lung: Clear without any rhonchi or wheezes.  No dullness to percussion. Abdomin: Soft, nontender, nondistended with good bowel sounds.  No hepatosplenomegaly. Musculoskeletal: No joint deformity or effusion.  Full range of motion noted. Neurological: No deficits noted on motor, sensory and deep tendon reflex exam. Skin: No petechial rash or dryness.  Appeared moist.                   Lab Results: Lab Results  Component Value Date   WBC 4.8 07/18/2021   HGB 13.6 07/18/2021   HCT 41.2 07/18/2021   MCV 90.2 07/18/2021   PLT 236 07/18/2021     Chemistry      Component Value Date/Time   NA 137 07/18/2021 1230   NA 136 10/17/2018 1139   K 4.5 07/18/2021 1230   CL 105 07/18/2021 1230   CO2 26 07/18/2021 1230   BUN 17 07/18/2021 1230   BUN 11 10/17/2018 1139   CREATININE 0.59 (L) 07/18/2021 1230   CREATININE 0.81 06/08/2015 0001      Component Value Date/Time   CALCIUM 9.1 07/18/2021 1230   ALKPHOS 47 07/18/2021 1230   AST 12 (L) 07/18/2021 1230   ALT 14 07/18/2021 1230   BILITOT 0.4 07/18/2021 1230         Latest Reference Range & Units 05/10/21 09:06 07/18/21 12:30  Prostate Specific Ag, Serum 0.0 - 4.0 ng/mL <0.1 <0.1         Impression and Plan:   71 year old man with:  1.  Castration-resistant advanced prostate cancer with disease to the bone diagnosed in 2020. [name]  His disease status  was updated at this time and treatment choices were reviewed.  His PSA continues to be undetectable on the current therapy without any evidence of relapse.  Risks and benefits of continuing Xtandi were discussed.  Complications: Hypertension, weight gain and excessive fatigue were reviewed.  Alternative treatment options occluding Taxotere chemotherapy among others were reiterated.  He is agreeable to continue at this time.    2.  Bone pain: Related to metastatic prostate cancer and appears to be improved at this time.  3.  Androgen deprivation:  He is currently on Eligard every 4 months.  This will be repeated in 2 months and continued indefinitely.  4.  Bone directed therapy: Risks and benefits of continuing Xgeva were discussed at this time.  Complications including osteonecrosis of the jaw and hypocalcemia were discussed.  He is agreeable to proceed.   5.  Prognosis and goals of care: Therapy remains palliative although aggressive measures are warranted given his excellent performance status and response to therapy.  6.  Follow-up: In 2 months for a follow-up evaluation.  30  minutes were spent on this encounter.  The time was dedicated to reviewing laboratory data, disease status update, treatment choices and future plan of care and review.   Zola Button, MD 07/20/2021 12:29 PM

## 2021-07-25 DIAGNOSIS — G4733 Obstructive sleep apnea (adult) (pediatric): Secondary | ICD-10-CM | POA: Diagnosis not present

## 2021-07-28 ENCOUNTER — Telehealth: Payer: Self-pay | Admitting: Oncology

## 2021-07-28 NOTE — Telephone Encounter (Signed)
Scheduled per 02/23 los, patient has been called and notified of upcoming appointments. 

## 2021-07-30 ENCOUNTER — Other Ambulatory Visit: Payer: Self-pay | Admitting: Family Medicine

## 2021-08-04 ENCOUNTER — Other Ambulatory Visit (HOSPITAL_COMMUNITY): Payer: Self-pay

## 2021-08-10 ENCOUNTER — Other Ambulatory Visit (HOSPITAL_COMMUNITY): Payer: Self-pay

## 2021-08-24 DIAGNOSIS — G4733 Obstructive sleep apnea (adult) (pediatric): Secondary | ICD-10-CM | POA: Diagnosis not present

## 2021-09-05 ENCOUNTER — Other Ambulatory Visit (HOSPITAL_COMMUNITY): Payer: Self-pay

## 2021-09-06 ENCOUNTER — Telehealth: Payer: Self-pay | Admitting: Internal Medicine

## 2021-09-06 NOTE — Telephone Encounter (Signed)
Patient has called and was told a PA needed for Invokana. I called and spoke with pharmacy and they will fax over the paper on this and I will work on it ?

## 2021-09-07 NOTE — Telephone Encounter (Signed)
I have faxed over PA for Invonkana to [employer] for approval ?

## 2021-09-08 NOTE — Telephone Encounter (Signed)
Invokana '300mg'$  has been approved from 09/06/2021-09/07/2022. Pt has been aware and I have faxed to pharmacy ?

## 2021-09-19 DIAGNOSIS — G4733 Obstructive sleep apnea (adult) (pediatric): Secondary | ICD-10-CM | POA: Diagnosis not present

## 2021-09-20 ENCOUNTER — Other Ambulatory Visit: Payer: HMO

## 2021-09-24 DIAGNOSIS — G4733 Obstructive sleep apnea (adult) (pediatric): Secondary | ICD-10-CM | POA: Diagnosis not present

## 2021-09-27 ENCOUNTER — Ambulatory Visit: Payer: HMO | Admitting: Oncology

## 2021-09-27 ENCOUNTER — Ambulatory Visit: Payer: HMO

## 2021-09-27 ENCOUNTER — Other Ambulatory Visit: Payer: Self-pay

## 2021-09-27 ENCOUNTER — Inpatient Hospital Stay: Payer: HMO | Attending: Oncology

## 2021-09-27 DIAGNOSIS — M546 Pain in thoracic spine: Secondary | ICD-10-CM | POA: Insufficient documentation

## 2021-09-27 DIAGNOSIS — M25551 Pain in right hip: Secondary | ICD-10-CM | POA: Insufficient documentation

## 2021-09-27 DIAGNOSIS — C61 Malignant neoplasm of prostate: Secondary | ICD-10-CM | POA: Diagnosis not present

## 2021-09-27 DIAGNOSIS — Z5111 Encounter for antineoplastic chemotherapy: Secondary | ICD-10-CM | POA: Insufficient documentation

## 2021-09-27 DIAGNOSIS — Z923 Personal history of irradiation: Secondary | ICD-10-CM | POA: Insufficient documentation

## 2021-09-27 DIAGNOSIS — Z79899 Other long term (current) drug therapy: Secondary | ICD-10-CM | POA: Diagnosis not present

## 2021-09-27 LAB — CMP (CANCER CENTER ONLY)
ALT: 18 U/L (ref 0–44)
AST: 16 U/L (ref 15–41)
Albumin: 4.1 g/dL (ref 3.5–5.0)
Alkaline Phosphatase: 46 U/L (ref 38–126)
Anion gap: 6 (ref 5–15)
BUN: 19 mg/dL (ref 8–23)
CO2: 25 mmol/L (ref 22–32)
Calcium: 9 mg/dL (ref 8.9–10.3)
Chloride: 104 mmol/L (ref 98–111)
Creatinine: 0.62 mg/dL (ref 0.61–1.24)
GFR, Estimated: >60 mL/min (ref >60)
Glucose, Bld: 154 mg/dL — ABNORMAL HIGH (ref 70–99)
Potassium: 4.7 mmol/L (ref 3.5–5.1)
Sodium: 135 mmol/L (ref 135–145)
Total Bilirubin: 0.7 mg/dL (ref 0.3–1.2)
Total Protein: 7.2 g/dL (ref 6.5–8.1)

## 2021-09-27 LAB — CBC WITH DIFFERENTIAL (CANCER CENTER ONLY)
Abs Immature Granulocytes: 0.03 10*3/uL (ref 0.00–0.07)
Basophils Absolute: 0 10*3/uL (ref 0.0–0.1)
Basophils Relative: 1 %
Eosinophils Absolute: 0 10*3/uL (ref 0.0–0.5)
Eosinophils Relative: 1 %
HCT: 41.5 % (ref 39.0–52.0)
Hemoglobin: 14 g/dL (ref 13.0–17.0)
Immature Granulocytes: 1 %
Lymphocytes Relative: 20 %
Lymphs Abs: 1 10*3/uL (ref 0.7–4.0)
MCH: 30.3 pg (ref 26.0–34.0)
MCHC: 33.7 g/dL (ref 30.0–36.0)
MCV: 89.8 fL (ref 80.0–100.0)
Monocytes Absolute: 0.5 10*3/uL (ref 0.1–1.0)
Monocytes Relative: 9 %
Neutro Abs: 3.6 10*3/uL (ref 1.7–7.7)
Neutrophils Relative %: 68 %
Platelet Count: 246 10*3/uL (ref 150–400)
RBC: 4.62 MIL/uL (ref 4.22–5.81)
RDW: 13.9 % (ref 11.5–15.5)
WBC Count: 5.2 10*3/uL (ref 4.0–10.5)
nRBC: 0 % (ref 0.0–0.2)

## 2021-09-28 ENCOUNTER — Other Ambulatory Visit: Payer: Self-pay | Admitting: Oncology

## 2021-09-28 ENCOUNTER — Other Ambulatory Visit (HOSPITAL_COMMUNITY): Payer: Self-pay

## 2021-09-28 LAB — PROSTATE-SPECIFIC AG, SERUM (LABCORP): Prostate Specific Ag, Serum: <0.1 ng/mL (ref 0.0–4.0)

## 2021-09-28 MED ORDER — ENZALUTAMIDE 40 MG PO TABS
ORAL_TABLET | ORAL | 3 refills | Status: DC
  Filled 2021-09-28: qty 120, 30d supply, fill #0
  Filled 2021-11-01: qty 120, 30d supply, fill #1
  Filled 2021-12-01: qty 120, 30d supply, fill #2

## 2021-10-05 ENCOUNTER — Other Ambulatory Visit: Payer: Self-pay

## 2021-10-05 ENCOUNTER — Inpatient Hospital Stay: Payer: HMO | Admitting: Oncology

## 2021-10-05 ENCOUNTER — Inpatient Hospital Stay: Payer: HMO

## 2021-10-05 VITALS — BP 128/79 | HR 83 | Temp 98.0°F | Resp 18 | Ht 67.0 in | Wt 220.1 lb

## 2021-10-05 DIAGNOSIS — C61 Malignant neoplasm of prostate: Secondary | ICD-10-CM | POA: Diagnosis not present

## 2021-10-05 DIAGNOSIS — C7951 Secondary malignant neoplasm of bone: Secondary | ICD-10-CM | POA: Diagnosis not present

## 2021-10-05 DIAGNOSIS — Z5111 Encounter for antineoplastic chemotherapy: Secondary | ICD-10-CM | POA: Diagnosis not present

## 2021-10-05 MED ORDER — LEUPROLIDE ACETATE (4 MONTH) 30 MG ~~LOC~~ KIT
30.0000 mg | PACK | Freq: Once | SUBCUTANEOUS | Status: AC
  Administered 2021-10-05: 30 mg via SUBCUTANEOUS
  Filled 2021-10-05: qty 30

## 2021-10-05 MED ORDER — DENOSUMAB 120 MG/1.7ML ~~LOC~~ SOLN
120.0000 mg | Freq: Once | SUBCUTANEOUS | Status: AC
  Administered 2021-10-05: 120 mg via SUBCUTANEOUS
  Filled 2021-10-05: qty 1.7

## 2021-10-05 NOTE — Progress Notes (Signed)
Hematology and Oncology Follow Up  ? ?Wesley White ?[id number] ?25-Nov-1950 71 y.o. ?10/05/2021 2:43 PM ?[name], MDLalonde, [name], MD  ? ? ? 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   ? ? ? ?Prior Therapy:  ? ?He is status post laparoscopic radical prostatectomy and pelvic lymphadenectomy on March 05, 2016.  The final pathology showed Gleason score 4+3 equal 7 with 0 out of 9 lymph node involved.  He did have extraprostatic extension at that time. ? ? 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  He is status post radiation therapy adjuvantly under the care of Dr. Tammi Klippel completed on May 4 of 2018.  He received 68.4 Gray in 38 fractions. ? ?Status post radiation therapy for isolated left iliac bone metastasis completed in June 2020. ? ?Zytiga 1000 mg daily started in August 2020.  Therapy discontinued because of lack of coverage  ? ?He developed worsening back pain and a rise in his PSA up to 2.7 with MRI showed worsening disease in the spine including T12, T1 and T5.  He is status post radiation therapy to the T11 and L4 completed on February 04, 2020 after completing 30 Gray in 10 fractions. ? ? ?Current therapy:  ? ?Eligard 30 mg every 4 months.  This will be repeated today. ? ?Xgeva 120 mg every 2 months.  He will receive this today and repeated 2 months. ? ?Xtandi 160 mg daily started on January 20, 2020  ? ? ?Interim History: Wesley White returns today for a follow-up evaluation.  Since last visit, he reports no major complaints.  He has reported some right hip discomfort that has persistent nature to it as of late.  He is still able to ambulate close to a mild daily.  He denies any fevers or chills trauma.  He denies any recent falls or syncope.  His performance status and quality of life remain  excellent.  He continues to tolerate Xtandi without any other complaints. ? ? ? ? ? ? ? ? ? ? ? ? ? ?Medications: Updated on review. ?Current Outpatient Medications  ?Medication Sig Dispense Refill  ? acetaminophen (TYLENOL) 325 MG tablet Take 650 mg by mouth every 6 (six) hours as needed for mild pain.     ? enzalutamide (XTANDI) 40 MG tablet TAKE 4 TABLETS (160 MG TOTAL) BY MOUTH DAILY. 120 tablet 3  ? ezetimibe (ZETIA) 10 MG tablet Take 1 tablet (10 mg total) by mouth daily. (Patient not taking: Reported on 06/08/2021) 90 tablet 3  ? ibuprofen (ADVIL) 600 MG tablet Take 600 mg by mouth every 6 (six) hours as needed.    ? INVOKANA 300 MG TABS tablet TAKE 1 TABLET BY MOUTH EVERY DAY BEFORE BREAKFAST 90 tablet 1  ? lisinopril (ZESTRIL) 5 MG tablet TAKE 1 TABLET BY MOUTH EVERY DAY 90 tablet 3  ? metFORMIN (GLUCOPHAGE) 500 MG tablet TAKE 1 TABLET BY MOUTH TWICE A DAY WITH MEALS 180 tablet 1  ? naproxen sodium (ALEVE) 220 MG tablet Take 220 mg by mouth daily as needed.    ? OneTouch Delica Lancets 00Q MISC See admin instructions.    ? ONETOUCH VERIO test strip USE AS DIRECTED AS NEEDED 100 strip 11  ? rosuvastatin (CRESTOR) 40 MG tablet TAKE 1 TABLET BY MOUTH EVERY DAY 90  tablet 1  ? tadalafil (CIALIS) 20 MG tablet Take 1 tablet (20 mg total) by mouth daily as needed for erectile dysfunction. 30 tablet 1  ? venlafaxine XR (EFFEXOR-XR) 37.5 MG 24 hr capsule TAKE 1 CAPSULE BY MOUTH DAILY WITH BREAKFAST. (Patient not taking: Reported on 06/08/2021) 30 capsule 1  ? ?No current facility-administered medications for this visit.  ? ?Facility-Administered Medications Ordered in Other Visits  ?Medication Dose Route Frequency Provider Last Rate Last Admin  ? magnesium citrate solution 1 Bottle  1 Bottle Oral Once Raynelle Bring, MD      ? sodium phosphate (FLEET) 7-19 GM/118ML enema 1 enema  1 enema Rectal Once Raynelle Bring, MD      ? ? ? ? ? ?Physical Exam: ? ? ? ? ? ? ? ? ? ?Blood pressure 128/79, pulse 83, temperature 98 ?F  (36.7 ?C), temperature source Temporal, resp. rate 18, height '5\' 7"'$  (1.702 m), weight 220 lb 1.6 oz (99.8 kg), SpO2 98 %. ? ? ? ? ? ?ECOG: 1 ? ? ? ?General appearance: Alert, awake without any distress. ?Head: Atraumatic without abnormalities ?Oropharynx: Without any thrush or ulcers. ?Eyes: No scleral icterus. ?Lymph nodes: No lymphadenopathy noted in the cervical, supraclavicular, or axillary nodes ?Heart:regular rate and rhythm, without any murmurs or gallops.   ?Lung: Clear to auscultation without any rhonchi, wheezes or dullness to percussion. ?Abdomin: Soft, nontender without any shifting dullness or ascites. ?Musculoskeletal: No clubbing or cyanosis. ?Neurological: No motor or sensory deficits. ?Skin: No rashes or lesions. ? ? ? ? ? ? ? ? ? ? ? ? ? ? ? ? ? ? ?Lab Results: ?Lab Results  ?Component Value Date  ? WBC 5.2 09/27/2021  ? HGB 14.0 09/27/2021  ? HCT 41.5 09/27/2021  ? MCV 89.8 09/27/2021  ? PLT 246 09/27/2021  ? ?  Chemistry   ?   ?Component Value Date/Time  ? NA 135 09/27/2021 0851  ? NA 136 10/17/2018 1139  ? K 4.7 09/27/2021 0851  ? CL 104 09/27/2021 0851  ? CO2 25 09/27/2021 0851  ? BUN 19 09/27/2021 0851  ? BUN 11 10/17/2018 1139  ? CREATININE 0.62 09/27/2021 0851  ? CREATININE 0.81 06/08/2015 0001  ?    ?Component Value Date/Time  ? CALCIUM 9.0 09/27/2021 0851  ? ALKPHOS 46 09/27/2021 0851  ? AST 16 09/27/2021 0851  ? ALT 18 09/27/2021 0851  ? BILITOT 0.7 09/27/2021 0851  ?  ? ? ? ? ? ? ? Latest Reference Range & Units 07/18/21 12:30 09/27/21 08:51  ?Prostate Specific Ag, Serum 0.0 - 4.0 ng/mL <0.1 <0.1  ? ? ?  ?Impression and Plan: ? ? ?71 year old man with: ? ?1.  Advanced prostate cancer with disease to the bone diagnosed in 2020.  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 ? ?He is currently on Xtandi with excellent tolerance and PSA that is undetectable.  Risks and benefits of continuing this treatment were reviewed at this time.  Complication occluding hypertension, fatigue and  alteration of mental status were reviewed.  He is agreeable to continuing we will update his staging scans as scheduled in the near future.  He will have a CT scan and a bone scan to evaluate his disease status. ? ?  ? ?2.  Right hip pain: Unclear etiology at this time.  I doubt this is related to malignancy but we will update his staging scans in the near future.  He may need orthopedic evaluation. ? ?3.  Androgen deprivation:  He will receive Eligard today and repeated and 4 months.  Complication clinic weight gain and hot flashes were discussed. ? ?4.  Bone directed therapy: He is currently on Xgeva which will be received today.  Complications including osteonecrosis of the jaw and hypocalcemia were reviewed. ? ? ?5.  Prognosis and goals of care: His disease is incurable although aggressive measures are warranted. ? ?6.  Follow-up: He will return in 2 months for repeat follow-up. ? ?30  minutes were dedicated to this visit.  The time was spent on reviewing laboratory data, disease status update and outlining future plan of care discussion. ? ? ?Zola Button, MD 10/05/2021 2:43 PM ?

## 2021-10-09 ENCOUNTER — Other Ambulatory Visit (HOSPITAL_COMMUNITY): Payer: Self-pay

## 2021-10-10 ENCOUNTER — Telehealth: Payer: Self-pay | Admitting: *Deleted

## 2021-10-10 NOTE — Telephone Encounter (Signed)
Attempted to call patient, no answer- VM not set up. No PA is required for CT. Was going to have him call [phone] to schedule. ? ?RN spoke with NM who is going to call him to schedule NM Bone scan ?

## 2021-10-18 ENCOUNTER — Encounter (HOSPITAL_COMMUNITY)
Admission: RE | Admit: 2021-10-18 | Discharge: 2021-10-18 | Disposition: A | Discharge status: Home / Self Care | Payer: HMO | Source: Ambulatory Visit | Attending: Oncology | Admitting: Oncology

## 2021-10-18 DIAGNOSIS — J984 Other disorders of lung: Secondary | ICD-10-CM | POA: Diagnosis not present

## 2021-10-18 DIAGNOSIS — C61 Malignant neoplasm of prostate: Secondary | ICD-10-CM

## 2021-10-18 DIAGNOSIS — I700 Atherosclerosis of aorta: Secondary | ICD-10-CM | POA: Diagnosis not present

## 2021-10-18 DIAGNOSIS — I3139 Other pericardial effusion (noninflammatory): Secondary | ICD-10-CM | POA: Diagnosis not present

## 2021-10-18 DIAGNOSIS — Q446 Cystic disease of liver: Secondary | ICD-10-CM | POA: Diagnosis not present

## 2021-10-18 DIAGNOSIS — I2510 Atherosclerotic heart disease of native coronary artery without angina pectoris: Secondary | ICD-10-CM | POA: Diagnosis not present

## 2021-10-18 DIAGNOSIS — K7689 Other specified diseases of liver: Secondary | ICD-10-CM | POA: Diagnosis not present

## 2021-10-18 DIAGNOSIS — C7951 Secondary malignant neoplasm of bone: Secondary | ICD-10-CM | POA: Insufficient documentation

## 2021-10-18 MED ORDER — TECHNETIUM TC 99M MEDRONATE IV KIT
20.0000 | PACK | Freq: Once | INTRAVENOUS | Status: AC | PRN
  Administered 2021-10-18: 20.3 via INTRAVENOUS

## 2021-10-18 MED ORDER — SODIUM CHLORIDE (PF) 0.9 % IJ SOLN
INTRAMUSCULAR | Status: AC
  Filled 2021-10-18: qty 50

## 2021-10-18 MED ORDER — IOHEXOL 300 MG/ML  SOLN
100.0000 mL | Freq: Once | INTRAMUSCULAR | Status: AC | PRN
  Administered 2021-10-18: 100 mL via INTRAVENOUS

## 2021-10-19 ENCOUNTER — Other Ambulatory Visit (HOSPITAL_COMMUNITY): Payer: HMO

## 2021-10-24 DIAGNOSIS — G4733 Obstructive sleep apnea (adult) (pediatric): Secondary | ICD-10-CM | POA: Diagnosis not present

## 2021-10-26 ENCOUNTER — Telehealth: Payer: Self-pay

## 2021-10-26 ENCOUNTER — Other Ambulatory Visit: Payer: Self-pay | Admitting: Family Medicine

## 2021-10-26 DIAGNOSIS — E119 Type 2 diabetes mellitus without complications: Secondary | ICD-10-CM

## 2021-10-26 NOTE — Telephone Encounter (Signed)
Pt. Wife called stating he needs a refill on his Invokana and Metformin to [facility] on [address] and [address]. They are getting ready to go out of town for 5 weeks.

## 2021-10-26 NOTE — Telephone Encounter (Signed)
Made appt for tomorrow

## 2021-10-27 ENCOUNTER — Ambulatory Visit (INDEPENDENT_AMBULATORY_CARE_PROVIDER_SITE_OTHER): Payer: HMO | Admitting: Family Medicine

## 2021-10-27 ENCOUNTER — Other Ambulatory Visit: Payer: Self-pay

## 2021-10-27 ENCOUNTER — Encounter: Payer: Self-pay | Admitting: Family Medicine

## 2021-10-27 ENCOUNTER — Encounter: Payer: Self-pay | Admitting: Oncology

## 2021-10-27 VITALS — BP 110/64 | HR 76 | Temp 97.7°F | Wt 218.6 lb

## 2021-10-27 DIAGNOSIS — E669 Obesity, unspecified: Secondary | ICD-10-CM

## 2021-10-27 DIAGNOSIS — E119 Type 2 diabetes mellitus without complications: Secondary | ICD-10-CM

## 2021-10-27 DIAGNOSIS — Z8546 Personal history of malignant neoplasm of prostate: Secondary | ICD-10-CM

## 2021-10-27 DIAGNOSIS — E1169 Type 2 diabetes mellitus with other specified complication: Secondary | ICD-10-CM

## 2021-10-27 DIAGNOSIS — E785 Hyperlipidemia, unspecified: Secondary | ICD-10-CM | POA: Diagnosis not present

## 2021-10-27 LAB — POCT GLYCOSYLATED HEMOGLOBIN (HGB A1C): Hemoglobin A1C: 8.1 % — AB (ref 4.0–5.6)

## 2021-10-27 LAB — HM DIABETES EYE EXAM

## 2021-10-27 MED ORDER — METFORMIN HCL 850 MG PO TABS: 850.0000 mg | ORAL_TABLET | Freq: Two times a day (BID) | ORAL | 1 refills | Status: DC

## 2021-10-27 NOTE — Progress Notes (Signed)
Subjective:    Patient ID: Wesley White, male    DOB: [birth year], 71 y.o.   MRN: [id number]  Wesley White is a 71 y.o. male who presents for follow-up of Type 2 diabetes mellitus. Patient is checking home blood sugars.   Home blood sugar records:  yes , on meter How often is blood sugars being checked: once Q two weeks Current symptoms/problems include none and have been stable. Daily foot checks: yes   Any foot concerns: none at this time  Last eye exam: 06/20/21 with Dr. Sabra Heck  Exercise:  walking QD He continues on metformin and Invokana.  He does not check his sugars regularly.  He continues on Cialis and is having no difficulty with that.  He is taking lisinopril.  He and his wife are getting ready to take a trip into the [place]. The following portions of the patient's history were reviewed and updated as appropriate: allergies, current medications, past medical history, past social history and problem list.  ROS as in subjective above.     Objective:    Physical Exam Alert and in no distress otherwise not examined.  Hemoglobin A1c is 8.1 Lab Review    Latest Ref Rng & Units 09/27/2021    8:51 AM 07/18/2021   12:30 PM 07/13/2021   11:49 AM 05/10/2021    9:06 AM 03/28/2021    2:51 PM  Diabetic Labs  HbA1c 4.0 - 5.6 %   8.1      Chol 100 - 199 mg/dL     190    HDL >39 mg/dL     61    Calc LDL 0 - 99 mg/dL     98    Triglycerides 0 - 149 mg/dL     184    Creatinine 0.61 - 1.24 mg/dL 0.62   0.59    0.66         10/05/2021    2:44 PM 07/20/2021    1:09 PM 07/13/2021   11:08 AM 06/08/2021    9:53 AM 05/12/2021    1:06 PM  BP/Weight  Systolic BP 025 427 062 376 283  Diastolic BP 79 77 78 68 82  Wt. (Lbs) 220.1 221 220 222.4 218.2  BMI 34.47 kg/m2 34.61 kg/m2 34.46 kg/m2 34.83 kg/m2 34.17 kg/m2      07/13/2021   11:00 AM 01/12/2020    8:30 AM  Foot/eye exam completion dates  Foot Form Completion Done Done    Wesley White  reports that he has never smoked. He has never  used smokeless tobacco. He reports current alcohol use. He reports that he does not use drugs.     Assessment & Plan:    Controlled type 2 diabetes mellitus without complication, without long-term current use of insulin ([place]) - Plan: POCT glycosylated hemoglobin (Hb A1C), metFORMIN (GLUCOPHAGE) 850 MG tablet  History of prostate cancer  Obesity (BMI 30.0-34.9)  Hyperlipidemia associated with type 2 diabetes mellitus (HCC)  Rx changes:  Increase metformin to 850 twice daily Education: Reviewed 'ABCs' of diabetes management (respective goals in parentheses):  A1C (<7), blood pressure (<130/80), and cholesterol (LDL <100). Compliance at present is estimated to be fair. Efforts to improve compliance (if necessary) will be directed at increased exercise. Follow up: 4 months He will continue on his other medications and recommend he check his blood sugar more frequently.  Discussed the possibility of adding a GLP-1 to his regimen if he does not continue to improve.

## 2021-11-01 ENCOUNTER — Other Ambulatory Visit (HOSPITAL_COMMUNITY): Payer: Self-pay

## 2021-11-02 ENCOUNTER — Encounter: Payer: Self-pay | Admitting: Family Medicine

## 2021-11-10 ENCOUNTER — Other Ambulatory Visit (HOSPITAL_COMMUNITY): Payer: Self-pay

## 2021-11-24 DIAGNOSIS — G4733 Obstructive sleep apnea (adult) (pediatric): Secondary | ICD-10-CM | POA: Diagnosis not present

## 2021-12-01 ENCOUNTER — Other Ambulatory Visit (HOSPITAL_COMMUNITY): Payer: Self-pay

## 2021-12-06 ENCOUNTER — Encounter: Payer: Self-pay | Admitting: Oncology

## 2021-12-11 ENCOUNTER — Encounter: Payer: Self-pay | Admitting: Oncology

## 2021-12-11 ENCOUNTER — Other Ambulatory Visit: Payer: Self-pay | Admitting: *Deleted

## 2021-12-11 ENCOUNTER — Other Ambulatory Visit: Payer: Self-pay

## 2021-12-11 ENCOUNTER — Inpatient Hospital Stay: Payer: HMO | Attending: Oncology

## 2021-12-11 ENCOUNTER — Other Ambulatory Visit (HOSPITAL_COMMUNITY): Payer: Self-pay

## 2021-12-11 ENCOUNTER — Telehealth: Payer: Self-pay | Admitting: Pharmacy Technician

## 2021-12-11 DIAGNOSIS — Z923 Personal history of irradiation: Secondary | ICD-10-CM | POA: Diagnosis not present

## 2021-12-11 DIAGNOSIS — Z79899 Other long term (current) drug therapy: Secondary | ICD-10-CM | POA: Diagnosis not present

## 2021-12-11 DIAGNOSIS — C61 Malignant neoplasm of prostate: Secondary | ICD-10-CM | POA: Insufficient documentation

## 2021-12-11 DIAGNOSIS — M546 Pain in thoracic spine: Secondary | ICD-10-CM | POA: Insufficient documentation

## 2021-12-11 DIAGNOSIS — Z5111 Encounter for antineoplastic chemotherapy: Secondary | ICD-10-CM | POA: Diagnosis present

## 2021-12-11 LAB — CBC WITH DIFFERENTIAL (CANCER CENTER ONLY)
Abs Immature Granulocytes: 0.03 10*3/uL (ref 0.00–0.07)
Basophils Absolute: 0 10*3/uL (ref 0.0–0.1)
Basophils Relative: 1 %
Eosinophils Absolute: 0.1 10*3/uL (ref 0.0–0.5)
Eosinophils Relative: 2 %
HCT: 43.9 % (ref 39.0–52.0)
Hemoglobin: 14.9 g/dL (ref 13.0–17.0)
Immature Granulocytes: 1 %
Lymphocytes Relative: 22 %
Lymphs Abs: 1.1 10*3/uL (ref 0.7–4.0)
MCH: 30.6 pg (ref 26.0–34.0)
MCHC: 33.9 g/dL (ref 30.0–36.0)
MCV: 90.1 fL (ref 80.0–100.0)
Monocytes Absolute: 0.4 10*3/uL (ref 0.1–1.0)
Monocytes Relative: 9 %
Neutro Abs: 3.2 10*3/uL (ref 1.7–7.7)
Neutrophils Relative %: 65 %
Platelet Count: 259 10*3/uL (ref 150–400)
RBC: 4.87 MIL/uL (ref 4.22–5.81)
RDW: 14.2 % (ref 11.5–15.5)
WBC Count: 4.8 10*3/uL (ref 4.0–10.5)
nRBC: 0 % (ref 0.0–0.2)

## 2021-12-11 LAB — CMP (CANCER CENTER ONLY)
ALT: 14 U/L (ref 0–44)
AST: 13 U/L — ABNORMAL LOW (ref 15–41)
Albumin: 4.3 g/dL (ref 3.5–5.0)
Alkaline Phosphatase: 59 U/L (ref 38–126)
Anion gap: 7 (ref 5–15)
BUN: 15 mg/dL (ref 8–23)
CO2: 28 mmol/L (ref 22–32)
Calcium: 10 mg/dL (ref 8.9–10.3)
Chloride: 101 mmol/L (ref 98–111)
Creatinine: 0.75 mg/dL (ref 0.61–1.24)
GFR, Estimated: >60 mL/min (ref >60)
Glucose, Bld: 191 mg/dL — ABNORMAL HIGH (ref 70–99)
Potassium: 4.8 mmol/L (ref 3.5–5.1)
Sodium: 136 mmol/L (ref 135–145)
Total Bilirubin: 0.4 mg/dL (ref 0.3–1.2)
Total Protein: 7.1 g/dL (ref 6.5–8.1)

## 2021-12-11 MED ORDER — ENZALUTAMIDE 40 MG PO TABS: ORAL_TABLET | ORAL | 3 refills | Status: DC

## 2021-12-11 NOTE — Telephone Encounter (Signed)
Oral Oncology Patient Advocate Encounter   Received notification that prior authorization for Wesley White is due for renewal.   PA submitted on 12/11/2021 [place] [id number]  Status is pending     Lady Deutscher, [place] Patient Advocate Specialist [place] Patient Advocate Team Direct Number: [phone]  Fax: [phone]

## 2021-12-11 NOTE — Telephone Encounter (Signed)
Oral Oncology Patient Advocate Encounter   Began application for assistance for Xtandi through [employer]).   Application has been submitted via online portal on 12/11/2021.  [place] phone number [phone]  I have spoken to the patient.   I will continue to check the status until final determination.   Lady Deutscher, CPhT-Adv Pharmacy Patient Advocate Specialist [place] Patient Advocate Team Direct Number: [phone]  Fax: [phone]

## 2021-12-11 NOTE — Telephone Encounter (Signed)
Oral Oncology Patient Advocate Encounter  Prior Authorization for Wesley White has been approved.    PA# [id number] Effective dates: 12/11/2021 through 12/11/2022  Patients co-pay is $666.27.    Wesley White, CPhT-Adv Pharmacy Patient Advocate Specialist [place] Patient Advocate Team Direct Number: [phone]  Fax: [phone]

## 2021-12-12 NOTE — Telephone Encounter (Signed)
Oral Oncology Patient Advocate Encounter   Received notification that the application for assistance for Xtandi through [employer] has been approved.    Effective dates: 12/12/2021 through "until eligibility change"  I have spoken to the patient.   Lady Deutscher, CPhT-Adv Pharmacy Patient Advocate Specialist [place] Patient Advocate Team Direct Number: [phone]  Fax: [phone]

## 2021-12-13 ENCOUNTER — Inpatient Hospital Stay: Payer: HMO

## 2021-12-13 ENCOUNTER — Other Ambulatory Visit: Payer: Self-pay

## 2021-12-13 ENCOUNTER — Inpatient Hospital Stay (HOSPITAL_BASED_OUTPATIENT_CLINIC_OR_DEPARTMENT_OTHER): Payer: HMO | Admitting: Oncology

## 2021-12-13 VITALS — BP 123/76 | HR 94 | Temp 97.7°F | Resp 17 | Ht 67.0 in | Wt 217.3 lb

## 2021-12-13 DIAGNOSIS — C7951 Secondary malignant neoplasm of bone: Secondary | ICD-10-CM

## 2021-12-13 DIAGNOSIS — C61 Malignant neoplasm of prostate: Secondary | ICD-10-CM

## 2021-12-13 LAB — PROSTATE-SPECIFIC AG, SERUM (LABCORP): Prostate Specific Ag, Serum: 0.2 ng/mL (ref 0.0–4.0)

## 2021-12-13 MED ORDER — DENOSUMAB 120 MG/1.7ML ~~LOC~~ SOLN
120.0000 mg | Freq: Once | SUBCUTANEOUS | Status: AC
  Administered 2021-12-13: 120 mg via SUBCUTANEOUS
  Filled 2021-12-13: qty 1.7

## 2021-12-13 NOTE — Progress Notes (Signed)
Hematology and Oncology Follow Up   Wesley White [id number] [birth year] 71 y.o. 12/13/2021 9:50 AM Wesley White, MDLalonde, [name], MD     Principle Diagnosis: 71 year old man with castration-resistant advanced prostate cancer with disease to the bone diagnosed in 2020.  He presented with PSA  10.4 and a Gleason score 4+3 = 7 in 2017 and localized disease.     Prior Therapy:   He is status post laparoscopic radical prostatectomy and pelvic lymphadenectomy on March 05, 2016.  The final pathology showed Gleason score 4+3 equal 7 with 0 out of 9 lymph node involved.  He did have extraprostatic extension at that time.   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  He is status post radiation therapy adjuvantly under the care of Dr. Tammi Klippel completed on May 4 of 2018.  He received 68.4 Gray in 38 fractions.  Status post radiation therapy for isolated left iliac bone metastasis completed in June 2020.  Zytiga 1000 mg daily started in August 2020.  Therapy discontinued because of lack of coverage   He developed worsening back pain and a rise in his PSA up to 2.7 with MRI showed worsening disease in the spine including T12, T1 and T5.  He is status post radiation therapy to the T11 and L4 completed on February 04, 2020 after completing 30 Gray in 10 fractions.   Current therapy:   Eligard 30 mg every 4 months.  Next injection will be given in September 2023.  Xgeva 120 mg every 2 months.   Xtandi 160 mg daily started on January 20, 2020    Interim History: Wesley White returns today for follow-up.  Since last visit, he reports no major changes in his health.  He was able to travel internationally in doing extensive hiking and walking excursions including walking 4 miles a day.  He denies any nausea vomiting or abdominal pain.  He does report some occasional back pain that has been intermittent.  His back pain is predominantly upper to middle without any neurological  deficits.  He has tolerated Xtandi reasonably well.  No nausea or vomiting or diarrhea.  He denies any excessive fatigue or tiredness.             Medications: Reviewed without changes. Current Outpatient Medications  Medication Sig Dispense Refill   acetaminophen (TYLENOL) 325 MG tablet Take 650 mg by mouth every 6 (six) hours as needed for mild pain.      enzalutamide (XTANDI) 40 MG tablet TAKE 4 TABLETS (160 MG TOTAL) BY MOUTH DAILY. 120 tablet 3   ezetimibe (ZETIA) 10 MG tablet Take 1 tablet (10 mg total) by mouth daily. (Patient not taking: Reported on 06/08/2021) 90 tablet 3   ibuprofen (ADVIL) 600 MG tablet Take 600 mg by mouth every 6 (six) hours as needed.     INVOKANA 300 MG TABS tablet TAKE 1 TABLET BY MOUTH EVERY DAY BEFORE BREAKFAST 90 tablet 0   lisinopril (ZESTRIL) 5 MG tablet TAKE 1 TABLET BY MOUTH EVERY DAY 90 tablet 3   metFORMIN (GLUCOPHAGE) 850 MG tablet Take 1 tablet (850 mg total) by mouth 2 (two) times daily with a meal. 180 tablet 1   naproxen sodium (ALEVE) 220 MG tablet Take 220 mg by mouth daily as needed.     OneTouch Delica Lancets 17B MISC See admin instructions.     ONETOUCH VERIO test strip USE AS DIRECTED AS NEEDED 100 strip 11   rosuvastatin (CRESTOR) 40 MG tablet  TAKE 1 TABLET BY MOUTH EVERY DAY 90 tablet 1   tadalafil (CIALIS) 20 MG tablet Take 1 tablet (20 mg total) by mouth daily as needed for erectile dysfunction. 30 tablet 1   venlafaxine XR (EFFEXOR-XR) 37.5 MG 24 hr capsule TAKE 1 CAPSULE BY MOUTH DAILY WITH BREAKFAST. (Patient not taking: Reported on 06/08/2021) 30 capsule 1   No current facility-administered medications for this visit.   Facility-Administered Medications Ordered in Other Visits  Medication Dose Route Frequency Provider Last Rate Last Admin   magnesium citrate solution 1 Bottle  1 Bottle Oral Once Raynelle Bring, MD       sodium phosphate (FLEET) 7-19 GM/118ML enema 1 enema  1 enema Rectal Once Raynelle Bring, MD            Physical Exam:           Blood pressure 123/76, pulse 94, temperature 97.7 F (36.5 C), temperature source Temporal, resp. rate 17, height '5\' 7"'$  (1.702 m), weight 217 lb 4.8 oz (98.6 kg), SpO2 96 %.      ECOG: 1   General appearance: Comfortable appearing without any discomfort Head: Normocephalic without any trauma Oropharynx: Mucous membranes are moist and pink without any thrush or ulcers. Eyes: Pupils are equal and round reactive to light. Lymph nodes: No cervical, supraclavicular, inguinal or axillary lymphadenopathy.   Heart:regular rate and rhythm.  S1 and S2 without leg edema. White: Clear without any rhonchi or wheezes.  No dullness to percussion. Abdomin: Soft, nontender, nondistended with good bowel sounds.  No hepatosplenomegaly. Musculoskeletal: No joint deformity or effusion.  Full range of motion noted. Neurological: No deficits noted on motor, sensory and deep tendon reflex exam. Skin: No petechial rash or dryness.  Appeared moist.                    Lab Results: Lab Results  Component Value Date   WBC 4.8 12/11/2021   HGB 14.9 12/11/2021   HCT 43.9 12/11/2021   MCV 90.1 12/11/2021   PLT 259 12/11/2021   PSA 10.03 (H) 09/22/2015   PSA 10.03 (H) 09/22/2015     Chemistry      Component Value Date/Time   NA 136 12/11/2021 1301   NA 136 10/17/2018 1139   K 4.8 12/11/2021 1301   CL 101 12/11/2021 1301   CO2 28 12/11/2021 1301   BUN 15 12/11/2021 1301   BUN 11 10/17/2018 1139   CREATININE 0.75 12/11/2021 1301   CREATININE 0.81 06/08/2015 0001      Component Value Date/Time   CALCIUM 10.0 12/11/2021 1301   ALKPHOS 59 12/11/2021 1301   AST 13 (L) 12/11/2021 1301   ALT 14 12/11/2021 1301   BILITOT 0.4 12/11/2021 1301         Latest Reference Range & Units 09/27/21 08:51 12/11/21 13:01  Prostate Specific Ag, Serum 0.0 - 4.0 ng/mL <0.1 0.2        Impression and Plan:   71 year old man with:  1.   Castration-resistant advanced prostate cancer with disease to the bone diagnosed in 2020.   The natural course of this disease was reviewed at this time and treatment choices were discussed.  He is currently on Xtandi with excellent overall response to therapy.  His PSA has been reasonably undetectable with recent slight rise to 0.2.  I recommended continuing the same dose and schedule with using Taxotere chemotherapy as a salvage option.  Alternative treatment will be a PARP inhibitor if he harbors the  appropriate mutation.  He is agreeable to continue with Xtandi.    2.  T8 sclerotic lesion noted on CT scan and bone scan in June 2023.  This could be responsible for the rise in the PSA and radiation therapy could be considered at this time for his oligoprogression.  3.  Androgen deprivation: He will continue to receive Eligard every 4 months.  Next injection will be in September 2023.   4.  Bone directed therapy: I recommended Xgeva to be continued indefinitely.  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   5.  Prognosis and goals of care: Therapy remains palliative although aggressive measures are warranted.   6.  Follow-up: In 2 months for repeat evaluation.  30  minutes were spent on this encounter.  The time was dedicated to reviewing imaging studies, laboratory data, disease status update, treatment choices and future plan of care discussion.   Zola Button, MD 12/13/2021 9:50 AM

## 2021-12-14 ENCOUNTER — Encounter: Payer: Self-pay | Admitting: Oncology

## 2021-12-15 ENCOUNTER — Telehealth: Payer: Self-pay | Admitting: Oncology

## 2021-12-15 NOTE — Telephone Encounter (Signed)
Called patient regarding upcoming September appointments, patient is notified.  

## 2021-12-20 DIAGNOSIS — G4733 Obstructive sleep apnea (adult) (pediatric): Secondary | ICD-10-CM | POA: Diagnosis not present

## 2021-12-24 DIAGNOSIS — G4733 Obstructive sleep apnea (adult) (pediatric): Secondary | ICD-10-CM | POA: Diagnosis not present

## 2022-01-01 ENCOUNTER — Encounter: Payer: Self-pay | Admitting: Oncology

## 2022-01-02 ENCOUNTER — Other Ambulatory Visit (HOSPITAL_COMMUNITY): Payer: Self-pay

## 2022-01-11 ENCOUNTER — Encounter: Payer: Self-pay | Admitting: Oncology

## 2022-01-11 ENCOUNTER — Other Ambulatory Visit (HOSPITAL_COMMUNITY): Payer: Self-pay

## 2022-01-11 ENCOUNTER — Telehealth: Payer: Self-pay | Admitting: Pharmacy Technician

## 2022-01-11 NOTE — Telephone Encounter (Signed)
Oral Oncology Patient Advocate Encounter  Was successful in re-enrolling patient for a $8,000 grant from [employer] to provide copayment coverage for Wesley White.  This will keep the out of pocket expense at $0.     [employer] ID: [id number]    The billing information is as follows and has been shared with [facility]. Rx is currently filled via Patient Assistance. Fatima Sanger will be held until further need.    RxBin: [id number] PCN: [id number] Member ID: [id number] Group ID: [id number] Dates of Eligibility: 12/12/2021 through 12/12/2022  Fund:  [place], [place] Patient Advocate Specialist [place] Patient Advocate Team Direct Number: [phone]  Fax: [phone]

## 2022-01-12 ENCOUNTER — Encounter: Payer: Self-pay | Admitting: Oncology

## 2022-01-18 DIAGNOSIS — Z192 Hormone resistant malignancy status: Secondary | ICD-10-CM | POA: Diagnosis not present

## 2022-01-18 DIAGNOSIS — C7951 Secondary malignant neoplasm of bone: Secondary | ICD-10-CM | POA: Diagnosis not present

## 2022-01-18 DIAGNOSIS — C61 Malignant neoplasm of prostate: Secondary | ICD-10-CM | POA: Diagnosis not present

## 2022-01-18 NOTE — Progress Notes (Signed)
  Radiation Oncology         [phone]) [phone] ________________________________  Name: Wesley White MRN: [id number]  Date: 01/19/2022  DOB: 06/01/1950  SIMULATION AND TREATMENT PLANNING NOTE    ICD-10-CM   1. 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  Identity was confirmed.  All relevant records and images related to the planned course of therapy were reviewed.  The patient freely provided informed written consent to proceed with treatment after reviewing the details related to the planned course of therapy. The consent form was witnessed and verified by the simulation staff.  Then, the patient was set-up in a stable reproducible  supine position for radiation therapy.  CT images were obtained.  Surface markings were placed.  The CT images were loaded into the planning software.  Then the target and avoidance structures were contoured.  Treatment planning then occurred.  The radiation prescription was entered and confirmed.  Then, I designed and supervised the construction of multiple medically necessary complex treatment devices documented in [place] including positioning devices and MLCs to shield critical organs.  I have requested : 3D Simulation  I have requested a DVH of the following structures: spinal cord, heart, left lung, right lung, esophagus and targets.    PLAN:  The patient will receive 30 Gy in 10 fractions.  ________________________________  Sheral Apley Tammi Klippel, M.D.

## 2022-01-19 ENCOUNTER — Ambulatory Visit
Admission: RE | Admit: 2022-01-19 | Discharge: 2022-01-19 | Disposition: A | Discharge status: Home / Self Care | Payer: HMO | Source: Ambulatory Visit | Attending: Urology | Admitting: Urology

## 2022-01-19 ENCOUNTER — Other Ambulatory Visit: Payer: Self-pay

## 2022-01-19 ENCOUNTER — Ambulatory Visit
Admission: RE | Admit: 2022-01-19 | Discharge: 2022-01-19 | Disposition: A | Discharge status: Home / Self Care | Payer: HMO | Source: Ambulatory Visit | Attending: Radiation Oncology | Admitting: Radiation Oncology

## 2022-01-19 ENCOUNTER — Encounter: Payer: Self-pay | Admitting: Urology

## 2022-01-19 VITALS — BP 110/74 | HR 103 | Temp 98.3°F | Resp 18 | Ht 70.0 in | Wt 222.2 lb

## 2022-01-19 VITALS — BP 110/74 | HR 103 | Temp 98.3°F | Resp 18 | Ht 70.0 in | Wt 222.1 lb

## 2022-01-19 DIAGNOSIS — E1122 Type 2 diabetes mellitus with diabetic chronic kidney disease: Secondary | ICD-10-CM | POA: Insufficient documentation

## 2022-01-19 DIAGNOSIS — C7951 Secondary malignant neoplasm of bone: Secondary | ICD-10-CM | POA: Insufficient documentation

## 2022-01-19 DIAGNOSIS — C61 Malignant neoplasm of prostate: Secondary | ICD-10-CM

## 2022-01-19 DIAGNOSIS — Z7984 Long term (current) use of oral hypoglycemic drugs: Secondary | ICD-10-CM | POA: Insufficient documentation

## 2022-01-19 DIAGNOSIS — Z510 Encounter for antineoplastic radiation therapy: Secondary | ICD-10-CM | POA: Insufficient documentation

## 2022-01-19 DIAGNOSIS — Z800 Family history of malignant neoplasm of digestive organs: Secondary | ICD-10-CM | POA: Insufficient documentation

## 2022-01-19 DIAGNOSIS — G4730 Sleep apnea, unspecified: Secondary | ICD-10-CM | POA: Insufficient documentation

## 2022-01-19 DIAGNOSIS — N189 Chronic kidney disease, unspecified: Secondary | ICD-10-CM | POA: Insufficient documentation

## 2022-01-19 DIAGNOSIS — Z79899 Other long term (current) drug therapy: Secondary | ICD-10-CM | POA: Diagnosis not present

## 2022-01-19 DIAGNOSIS — E785 Hyperlipidemia, unspecified: Secondary | ICD-10-CM | POA: Insufficient documentation

## 2022-01-19 DIAGNOSIS — Z192 Hormone resistant malignancy status: Secondary | ICD-10-CM | POA: Diagnosis not present

## 2022-01-19 NOTE — Progress Notes (Signed)
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    ICD-10-CM   1. Malignant neoplasm of prostate metastatic to bone [facility])  C61    C79.51       HISTORY OF PRESENT ILLNESS: Wesley White is a 71 y.o. male seen at the request of [name]. He is well known to [place] for his history of metastatic prostate cancer. In summary, he was diagnosed with prostate cancer in 11/2015 and opted to proceed with prostatectomy. RALP on 03/05/16 by Dr. Alinda Money showed Gleason 4+5 prostate cancer with high risk features, including tertiary pattern 5, extraprostatic extension, left seminal vesicle involvement, and positive margin at bladder neck. Lymph nodes were negative. His postoperative PSA was barely detectable, and he proceeded with adjuvant IMRT 08/08/16 - 09/28/16.  He developed a solitary metastasis to the left iliac bone in 10/2018. He was started on Eligard and completed SBRT to the lesion 12/09/18 - 12/19/18. He took [place] briefly but stopped due to lack of insurance coverage. He was found to have progressive osseous metastatic disease in 08/2019 and became symptomatic with radiating back pain in 12/2019. [name] started the patient on Xtandi on 01/20/20, in addition to [place]. He was referred back to [place] and was treated with IMRT to T11 through L4 01/21/20 - 02/04/20.  More recently, he underwent restaging CT C/A/P and bone scan on 10/18/21 showing a new/progressive sclerotic lesion in the T8 vertebral body. He has continued on Eligard ADT and Xtandi and his PSA had remained undetectable on treatment until his most recent recheck on 12/11/21,  at which time had become low detectable at 0.2.  He has been kindly referred back to [place] today for consideration of palliative radiation treatment to the painful new/progressive oligometastasis at T8.  PREVIOUS RADIATION THERAPY: Yes  01/21/20 - 02/04/20: The patient received 30 Gy in 10 fractions to T11 to L4, inclusive.   12/09/2018 - 12/19/2018: The isolated oligometastasis (left iliac lesion) was treated to 40 Gy in 5 fractions of 8 Gy (SBRT)   08/08/2016 - 09/28/2016: The prostatic fossa was treated to 68.4 Gy in 38 fractions of 1.8 Gy  PAST MEDICAL HISTORY:  Past Medical History:  Diagnosis Date   Cancer of spine ([place])    Chronic kidney disease    hx kidney stone year ago   Prediabetes    Prostate cancer [facility])    Sleep apnea       PAST SURGICAL HISTORY: Past Surgical History:  Procedure Laterality Date   HERNIA REPAIR     1983 baland    LITHOTRIPSY     2011 by tannenbaum   LYMPHADENECTOMY Bilateral 03/05/2016   Procedure: PELVIC LYMPHADENECTOMY;  Surgeon: Raynelle Bring, MD;  Location: [facility];  Service: Urology;  Laterality: Bilateral;   PROSTATE BIOPSY     ROBOT ASSISTED LAPAROSCOPIC RADICAL PROSTATECTOMY N/A 03/05/2016   Procedure: XI ROBOTIC ASSISTED LAPAROSCOPIC RADICAL PROSTATECTOMY LEVEL 2;  Surgeon: Raynelle Bring, MD;  Location: [facility];  Service: Urology;  Laterality: N/A;    FAMILY HISTORY:  Family History  Problem Relation Age of Onset   Cancer Father 14  Lymphoma   Cancer Brother 70       Prostate   Diabetes Mother    Hyperlipidemia Mother    Hypertension Mother    Bipolar disorder Daughter    Colon cancer Neg Hx    Esophageal cancer Neg Hx    Rectal cancer Neg Hx    Stomach cancer Neg Hx     SOCIAL HISTORY:  Social History   Socioeconomic History   Marital status: Married    Spouse name: Not on file   Number of children: Not on file   Years of education: Not on file   Highest education level: Not on file  Occupational History   Not on file   Tobacco Use   Smoking status: Never   Smokeless tobacco: Never  Substance and Sexual Activity   Alcohol use: Yes    Comment: rare   Drug use: No   Sexual activity: Yes  Other Topics Concern   Not on file  Social History Narrative   Not on file   Social Determinants of Health   Financial Resource Strain: Not on file  Food Insecurity: No Food Insecurity (03/21/2020)   Hunger Vital Sign    Worried About Running Out of Food in the Last Year: Never true    Ran Out of Food in the Last Year: Never true  Transportation Needs: No Transportation Needs (03/21/2020)   PRAPARE - Hydrologist (Medical): No    Lack of Transportation (Non-Medical): No  Physical Activity: Not on file  Stress: Not on file  Social Connections: Not on file  Intimate Partner Violence: Not on file    ALLERGIES: Patient has no known allergies.  MEDICATIONS:  Current Outpatient Medications  Medication Sig Dispense Refill   acetaminophen (TYLENOL) 325 MG tablet Take 650 mg by mouth every 6 (six) hours as needed for mild pain.      enzalutamide (XTANDI) 40 MG tablet TAKE 4 TABLETS (160 MG TOTAL) BY MOUTH DAILY. 120 tablet 3   ezetimibe (ZETIA) 10 MG tablet Take 1 tablet (10 mg total) by mouth daily. (Patient not taking: Reported on 06/08/2021) 90 tablet 3   ibuprofen (ADVIL) 600 MG tablet Take 600 mg by mouth every 6 (six) hours as needed.     INVOKANA 300 MG TABS tablet TAKE 1 TABLET BY MOUTH EVERY DAY BEFORE BREAKFAST 90 tablet 0   lisinopril (ZESTRIL) 5 MG tablet TAKE 1 TABLET BY MOUTH EVERY DAY 90 tablet 3   metFORMIN (GLUCOPHAGE) 850 MG tablet Take 1 tablet (850 mg total) by mouth 2 (two) times daily with a meal. 180 tablet 1   naproxen sodium (ALEVE) 220 MG tablet Take 220 mg by mouth daily as needed.     OneTouch Delica Lancets 85Y MISC See admin instructions.     ONETOUCH VERIO test strip USE AS DIRECTED AS NEEDED 100 strip 11   rosuvastatin (CRESTOR) 40 MG tablet TAKE 1 TABLET  BY MOUTH EVERY DAY 90 tablet 1   tadalafil (CIALIS) 20 MG tablet Take 1 tablet (20 mg total) by mouth daily as needed for erectile dysfunction. 30 tablet 1   venlafaxine XR (EFFEXOR-XR) 37.5 MG 24 hr capsule TAKE 1 CAPSULE BY MOUTH DAILY WITH BREAKFAST. (Patient not taking: Reported on 06/08/2021) 30 capsule 1   No current facility-administered medications for this encounter.   Facility-Administered Medications Ordered in Other Encounters  Medication Dose Route Frequency Provider Last Rate Last Admin   magnesium citrate solution 1 Bottle  1  Bottle Oral Once Raynelle Bring, MD       sodium phosphate (FLEET) 7-19 GM/118ML enema 1 enema  1 enema Rectal Once Raynelle Bring, MD        REVIEW OF SYSTEMS:  On review of systems, the patient reports that he is doing well overall. He has had some mid back pain radiating around to his left flank/ribs for several months but progressively worsening over the past 3-4 weeks. He has been alternating OTC Alleve and Tylenol for the pain which helps but he is using it more and more often to control the pain currently. He is unable to comfortably rest his arms on top of his left side or sleep on the left side at night. He denies any focal weakness in the upper or lower extremities or changes in bowel or bladder habits. He denies any chest pain, shortness of breath, cough, fevers, chills, night sweats, unintended weight changes. He denies any bowel or bladder disturbances, and denies abdominal pain, nausea or vomiting. He denies any new musculoskeletal or joint aches or pains aside from that mentioned above. A complete review of systems is obtained and is otherwise negative.    PHYSICAL EXAM:  Wt Readings from Last 3 Encounters:  01/19/22 222 lb 3.2 oz (100.8 kg)  01/19/22 222 lb 2 oz (100.8 kg)  12/13/21 217 lb 4.8 oz (98.6 kg)   Temp Readings from Last 3 Encounters:  01/19/22 98.3 F (36.8 C) (Temporal)  01/19/22 98.3 F (36.8 C) (Oral)  12/13/21 97.7 F (36.5  C) (Temporal)   BP Readings from Last 3 Encounters:  01/19/22 110/74  01/19/22 110/74  12/13/21 123/76   Pulse Readings from Last 3 Encounters:  01/19/22 (!) 103  01/19/22 (!) 103  12/13/21 94   Pain Assessment Pain Score: 5  (Bilateral upper rib/ mid back)/10  In general this is a well appearing Caucasian male in no acute distress. He's alert and oriented x4 and appropriate throughout the examination. Cardiopulmonary assessment is negative for acute distress and he exhibits normal effort.     KPS = 100  100 - Normal; no complaints; no evidence of disease. 90   - Able to carry on normal activity; minor signs or symptoms of disease. 80   - Normal activity with effort; some signs or symptoms of disease. 29   - Cares for self; unable to carry on normal activity or to do active work. 60   - Requires occasional assistance, but is able to care for most of his personal needs. 50   - Requires considerable assistance and frequent medical care. 41   - Disabled; requires special care and assistance. 53   - Severely disabled; hospital admission is indicated although death not imminent. 59   - Very sick; hospital admission necessary; active supportive treatment necessary. 10   - Moribund; fatal processes progressing rapidly. 0     - Dead  Karnofsky DA, Abelmann [place], Craver LS and Burchenal [facility] [phone]) The use of the nitrogen mustards in the palliative treatment of carcinoma: with particular reference to bronchogenic carcinoma Cancer 1 634-56  LABORATORY DATA:  Lab Results  Component Value Date   WBC 4.8 12/11/2021   HGB 14.9 12/11/2021   HCT 43.9 12/11/2021   MCV 90.1 12/11/2021   PLT 259 12/11/2021   Lab Results  Component Value Date   NA 136 12/11/2021   K 4.8 12/11/2021   CL 101 12/11/2021   CO2 28 12/11/2021   Lab Results  Component Value Date  ALT 14 12/11/2021   AST 13 (L) 12/11/2021   ALKPHOS 59 12/11/2021   BILITOT 0.4 12/11/2021     RADIOGRAPHY: No results  found.    IMPRESSION/PLAN: 1. 72 y.o. 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  Today, we talked to the patient and his wife about the findings and workup thus far. We discussed the natural history of metastatic prostate cancer and general treatment, highlighting the role of radiotherapy in the management. We discussed the available radiation techniques, and focused on the details and logistics of delivery which he is quite familiar with, having had multiple courses of radiation previously, as noted in the HPI. The recommendation is for a 2 week course of daily, palliative radiation to the painful metastasis at T8.  We reviewed the anticipated acute and late sequelae associated with radiation in this setting. The patient and his wife were encouraged to ask questions that were answered to their stated satisfaction.  At the end of our conversation, the patient is in agreement to proceed with the recommended 2 week course of daily, palliative radiation to the painful metastasis at T8. He has freely signed written consent today in the office and a copy of this document will be placed in his medical record. We will share our discussion with [name] and proceed with CT SIM/treatment planning following our visit this morning, in anticipation of beginning his daily treatments in the near future. We enjoyed meeting with him and his wife again today and look forward to continuing to participate in his care.   We personally spent 45 minutes in this encounter including chart review, reviewing radiological studies, meeting face-to-face with the patient, entering orders and completing documentation.    Nicholos Johns, PA-C    Tyler Pita, MD  [facility] Direct Dial: [phone]  Fax: [phone] [email or web address]  Skype  LinkedIn   This document serves as a record of  services personally performed by Tyler Pita, MD and Freeman Caldron, PA-C. It was created on their behalf by Wilburn Mylar, a trained medical scribe. The creation of this record is based on the scribe's personal observations and the provider's statements to them. This document has been checked and approved by the attending provider.

## 2022-01-19 NOTE — Progress Notes (Signed)
Reconsult appointment. I verified patient's identity and began nursing interview w/ spouse Mrs. Aris Even in attendance. Patient reports fatigue, pain 5/10 w/ upper bilateral ribs, and mid back. No other issues reported at this time.  Meaningful use complete.  BP 110/74 (BP Location: Left Arm, Patient Position: Sitting, Cuff Size: Normal)   Pulse (!) 103   Temp 98.3 F (36.8 C) (Temporal)   Resp 18   Ht '5\' 10"'$  (1.778 m)   Wt 222 lb 3.2 oz (100.8 kg)   SpO2 97%   BMI 31.88 kg/m

## 2022-01-21 ENCOUNTER — Other Ambulatory Visit: Payer: Self-pay | Admitting: Family Medicine

## 2022-01-22 ENCOUNTER — Other Ambulatory Visit: Payer: Self-pay | Admitting: Family Medicine

## 2022-01-22 DIAGNOSIS — Z510 Encounter for antineoplastic radiation therapy: Secondary | ICD-10-CM | POA: Diagnosis not present

## 2022-01-22 DIAGNOSIS — C61 Malignant neoplasm of prostate: Secondary | ICD-10-CM | POA: Diagnosis not present

## 2022-01-22 DIAGNOSIS — D225 Melanocytic nevi of trunk: Secondary | ICD-10-CM | POA: Diagnosis not present

## 2022-01-22 DIAGNOSIS — E119 Type 2 diabetes mellitus without complications: Secondary | ICD-10-CM

## 2022-01-22 DIAGNOSIS — Z192 Hormone resistant malignancy status: Secondary | ICD-10-CM | POA: Diagnosis not present

## 2022-01-22 DIAGNOSIS — L570 Actinic keratosis: Secondary | ICD-10-CM | POA: Diagnosis not present

## 2022-01-22 DIAGNOSIS — L814 Other melanin hyperpigmentation: Secondary | ICD-10-CM | POA: Diagnosis not present

## 2022-01-22 DIAGNOSIS — C7951 Secondary malignant neoplasm of bone: Secondary | ICD-10-CM | POA: Diagnosis not present

## 2022-01-24 DIAGNOSIS — G4733 Obstructive sleep apnea (adult) (pediatric): Secondary | ICD-10-CM | POA: Diagnosis not present

## 2022-01-25 ENCOUNTER — Other Ambulatory Visit: Payer: Self-pay

## 2022-01-25 ENCOUNTER — Ambulatory Visit
Admission: RE | Admit: 2022-01-25 | Discharge: 2022-01-25 | Disposition: A | Discharge status: Home / Self Care | Payer: HMO | Source: Ambulatory Visit | Attending: Radiation Oncology | Admitting: Radiation Oncology

## 2022-01-25 DIAGNOSIS — C7951 Secondary malignant neoplasm of bone: Secondary | ICD-10-CM | POA: Diagnosis not present

## 2022-01-25 DIAGNOSIS — Z510 Encounter for antineoplastic radiation therapy: Secondary | ICD-10-CM | POA: Diagnosis not present

## 2022-01-25 DIAGNOSIS — C61 Malignant neoplasm of prostate: Secondary | ICD-10-CM | POA: Diagnosis not present

## 2022-01-25 DIAGNOSIS — Z192 Hormone resistant malignancy status: Secondary | ICD-10-CM | POA: Diagnosis not present

## 2022-01-25 LAB — RAD ONC ARIA SESSION SUMMARY
Course Elapsed Days: 0
Plan Fractions Treated to Date: 1
Plan Prescribed Dose Per Fraction: 3 Gy
Plan Total Fractions Prescribed: 10
Plan Total Prescribed Dose: 30 Gy
Reference Point Dosage Given to Date: 3 Gy
Reference Point Session Dosage Given: 3 Gy
Session Number: 1

## 2022-01-26 ENCOUNTER — Ambulatory Visit
Admission: RE | Admit: 2022-01-26 | Discharge: 2022-01-26 | Disposition: A | Discharge status: Home / Self Care | Payer: HMO | Source: Ambulatory Visit | Attending: Radiation Oncology | Admitting: Radiation Oncology

## 2022-01-26 ENCOUNTER — Other Ambulatory Visit: Payer: Self-pay

## 2022-01-26 DIAGNOSIS — C61 Malignant neoplasm of prostate: Secondary | ICD-10-CM | POA: Diagnosis not present

## 2022-01-26 DIAGNOSIS — Z510 Encounter for antineoplastic radiation therapy: Secondary | ICD-10-CM | POA: Insufficient documentation

## 2022-01-26 DIAGNOSIS — C7951 Secondary malignant neoplasm of bone: Secondary | ICD-10-CM | POA: Insufficient documentation

## 2022-01-26 DIAGNOSIS — Z192 Hormone resistant malignancy status: Secondary | ICD-10-CM | POA: Diagnosis not present

## 2022-01-26 LAB — RAD ONC ARIA SESSION SUMMARY
Course Elapsed Days: 1
Plan Fractions Treated to Date: 2
Plan Prescribed Dose Per Fraction: 3 Gy
Plan Total Fractions Prescribed: 10
Plan Total Prescribed Dose: 30 Gy
Reference Point Dosage Given to Date: 6 Gy
Reference Point Session Dosage Given: 3 Gy
Session Number: 2

## 2022-01-30 ENCOUNTER — Other Ambulatory Visit: Payer: Self-pay

## 2022-01-30 ENCOUNTER — Ambulatory Visit: Payer: HMO

## 2022-01-30 ENCOUNTER — Ambulatory Visit
Admission: RE | Admit: 2022-01-30 | Discharge: 2022-01-30 | Disposition: A | Discharge status: Home / Self Care | Payer: HMO | Source: Ambulatory Visit | Attending: Radiation Oncology | Admitting: Radiation Oncology

## 2022-01-30 DIAGNOSIS — Z510 Encounter for antineoplastic radiation therapy: Secondary | ICD-10-CM | POA: Diagnosis not present

## 2022-01-30 DIAGNOSIS — C61 Malignant neoplasm of prostate: Secondary | ICD-10-CM | POA: Diagnosis not present

## 2022-01-30 DIAGNOSIS — C7951 Secondary malignant neoplasm of bone: Secondary | ICD-10-CM | POA: Diagnosis not present

## 2022-01-30 DIAGNOSIS — Z192 Hormone resistant malignancy status: Secondary | ICD-10-CM | POA: Diagnosis not present

## 2022-01-30 LAB — RAD ONC ARIA SESSION SUMMARY
Course Elapsed Days: 5
Plan Fractions Treated to Date: 3
Plan Prescribed Dose Per Fraction: 3 Gy
Plan Total Fractions Prescribed: 10
Plan Total Prescribed Dose: 30 Gy
Reference Point Dosage Given to Date: 9 Gy
Reference Point Session Dosage Given: 3 Gy
Session Number: 3

## 2022-01-31 ENCOUNTER — Encounter: Payer: Self-pay | Admitting: Internal Medicine

## 2022-01-31 ENCOUNTER — Ambulatory Visit
Admission: RE | Admit: 2022-01-31 | Discharge: 2022-01-31 | Disposition: A | Discharge status: Home / Self Care | Payer: HMO | Source: Ambulatory Visit | Attending: Radiation Oncology | Admitting: Radiation Oncology

## 2022-01-31 ENCOUNTER — Other Ambulatory Visit: Payer: Self-pay

## 2022-01-31 DIAGNOSIS — C61 Malignant neoplasm of prostate: Secondary | ICD-10-CM | POA: Diagnosis not present

## 2022-01-31 DIAGNOSIS — Z192 Hormone resistant malignancy status: Secondary | ICD-10-CM | POA: Diagnosis not present

## 2022-01-31 DIAGNOSIS — Z510 Encounter for antineoplastic radiation therapy: Secondary | ICD-10-CM | POA: Diagnosis not present

## 2022-01-31 DIAGNOSIS — C7951 Secondary malignant neoplasm of bone: Secondary | ICD-10-CM | POA: Diagnosis not present

## 2022-01-31 LAB — RAD ONC ARIA SESSION SUMMARY
Course Elapsed Days: 6
Plan Fractions Treated to Date: 4
Plan Prescribed Dose Per Fraction: 3 Gy
Plan Total Fractions Prescribed: 10
Plan Total Prescribed Dose: 30 Gy
Reference Point Dosage Given to Date: 12 Gy
Reference Point Session Dosage Given: 3 Gy
Session Number: 4

## 2022-02-01 ENCOUNTER — Other Ambulatory Visit: Payer: Self-pay

## 2022-02-01 ENCOUNTER — Ambulatory Visit
Admission: RE | Admit: 2022-02-01 | Discharge: 2022-02-01 | Disposition: A | Discharge status: Home / Self Care | Payer: HMO | Source: Ambulatory Visit | Attending: Radiation Oncology | Admitting: Radiation Oncology

## 2022-02-01 DIAGNOSIS — Z510 Encounter for antineoplastic radiation therapy: Secondary | ICD-10-CM | POA: Diagnosis not present

## 2022-02-01 DIAGNOSIS — Z192 Hormone resistant malignancy status: Secondary | ICD-10-CM | POA: Diagnosis not present

## 2022-02-01 DIAGNOSIS — C61 Malignant neoplasm of prostate: Secondary | ICD-10-CM | POA: Diagnosis not present

## 2022-02-01 DIAGNOSIS — C7951 Secondary malignant neoplasm of bone: Secondary | ICD-10-CM | POA: Diagnosis not present

## 2022-02-01 LAB — RAD ONC ARIA SESSION SUMMARY
Course Elapsed Days: 7
Plan Fractions Treated to Date: 5
Plan Prescribed Dose Per Fraction: 3 Gy
Plan Total Fractions Prescribed: 10
Plan Total Prescribed Dose: 30 Gy
Reference Point Dosage Given to Date: 15 Gy
Reference Point Session Dosage Given: 3 Gy
Session Number: 5

## 2022-02-02 ENCOUNTER — Ambulatory Visit
Admission: RE | Admit: 2022-02-02 | Discharge: 2022-02-02 | Disposition: A | Discharge status: Home / Self Care | Payer: HMO | Source: Ambulatory Visit | Attending: Radiation Oncology | Admitting: Radiation Oncology

## 2022-02-02 ENCOUNTER — Other Ambulatory Visit: Payer: Self-pay

## 2022-02-02 DIAGNOSIS — C7951 Secondary malignant neoplasm of bone: Secondary | ICD-10-CM | POA: Diagnosis not present

## 2022-02-02 DIAGNOSIS — Z192 Hormone resistant malignancy status: Secondary | ICD-10-CM | POA: Diagnosis not present

## 2022-02-02 DIAGNOSIS — C61 Malignant neoplasm of prostate: Secondary | ICD-10-CM | POA: Diagnosis not present

## 2022-02-02 DIAGNOSIS — Z510 Encounter for antineoplastic radiation therapy: Secondary | ICD-10-CM | POA: Diagnosis not present

## 2022-02-02 LAB — RAD ONC ARIA SESSION SUMMARY
Course Elapsed Days: 8
Plan Fractions Treated to Date: 6
Plan Prescribed Dose Per Fraction: 3 Gy
Plan Total Fractions Prescribed: 10
Plan Total Prescribed Dose: 30 Gy
Reference Point Dosage Given to Date: 18 Gy
Reference Point Session Dosage Given: 3 Gy
Session Number: 6

## 2022-02-05 ENCOUNTER — Other Ambulatory Visit: Payer: Self-pay

## 2022-02-05 ENCOUNTER — Ambulatory Visit: Payer: HMO

## 2022-02-05 ENCOUNTER — Ambulatory Visit
Admission: RE | Admit: 2022-02-05 | Discharge: 2022-02-05 | Disposition: A | Discharge status: Home / Self Care | Payer: HMO | Source: Ambulatory Visit | Attending: Radiation Oncology | Admitting: Radiation Oncology

## 2022-02-05 DIAGNOSIS — C61 Malignant neoplasm of prostate: Secondary | ICD-10-CM | POA: Diagnosis not present

## 2022-02-05 DIAGNOSIS — C7951 Secondary malignant neoplasm of bone: Secondary | ICD-10-CM | POA: Diagnosis not present

## 2022-02-05 DIAGNOSIS — Z510 Encounter for antineoplastic radiation therapy: Secondary | ICD-10-CM | POA: Diagnosis not present

## 2022-02-05 DIAGNOSIS — Z192 Hormone resistant malignancy status: Secondary | ICD-10-CM | POA: Diagnosis not present

## 2022-02-05 LAB — RAD ONC ARIA SESSION SUMMARY
Course Elapsed Days: 11
Plan Fractions Treated to Date: 7
Plan Prescribed Dose Per Fraction: 3 Gy
Plan Total Fractions Prescribed: 10
Plan Total Prescribed Dose: 30 Gy
Reference Point Dosage Given to Date: 21 Gy
Reference Point Session Dosage Given: 3 Gy
Session Number: 7

## 2022-02-06 ENCOUNTER — Other Ambulatory Visit: Payer: Self-pay

## 2022-02-06 ENCOUNTER — Ambulatory Visit
Admission: RE | Admit: 2022-02-06 | Discharge: 2022-02-06 | Disposition: A | Discharge status: Home / Self Care | Payer: HMO | Source: Ambulatory Visit | Attending: Radiation Oncology | Admitting: Radiation Oncology

## 2022-02-06 ENCOUNTER — Ambulatory Visit: Payer: HMO

## 2022-02-06 DIAGNOSIS — Z510 Encounter for antineoplastic radiation therapy: Secondary | ICD-10-CM | POA: Diagnosis not present

## 2022-02-06 DIAGNOSIS — Z192 Hormone resistant malignancy status: Secondary | ICD-10-CM | POA: Diagnosis not present

## 2022-02-06 DIAGNOSIS — C7951 Secondary malignant neoplasm of bone: Secondary | ICD-10-CM | POA: Diagnosis not present

## 2022-02-06 DIAGNOSIS — C61 Malignant neoplasm of prostate: Secondary | ICD-10-CM | POA: Diagnosis not present

## 2022-02-06 LAB — RAD ONC ARIA SESSION SUMMARY
Course Elapsed Days: 12
Plan Fractions Treated to Date: 8
Plan Prescribed Dose Per Fraction: 3 Gy
Plan Total Fractions Prescribed: 10
Plan Total Prescribed Dose: 30 Gy
Reference Point Dosage Given to Date: 24 Gy
Reference Point Session Dosage Given: 3 Gy
Session Number: 8

## 2022-02-07 ENCOUNTER — Ambulatory Visit
Admission: RE | Admit: 2022-02-07 | Discharge: 2022-02-07 | Disposition: A | Discharge status: Home / Self Care | Payer: HMO | Source: Ambulatory Visit | Attending: Radiation Oncology | Admitting: Radiation Oncology

## 2022-02-07 ENCOUNTER — Ambulatory Visit: Payer: HMO

## 2022-02-07 ENCOUNTER — Other Ambulatory Visit: Payer: Self-pay

## 2022-02-07 DIAGNOSIS — Z510 Encounter for antineoplastic radiation therapy: Secondary | ICD-10-CM | POA: Diagnosis not present

## 2022-02-07 DIAGNOSIS — C7951 Secondary malignant neoplasm of bone: Secondary | ICD-10-CM | POA: Diagnosis not present

## 2022-02-07 DIAGNOSIS — C61 Malignant neoplasm of prostate: Secondary | ICD-10-CM | POA: Diagnosis not present

## 2022-02-07 DIAGNOSIS — Z192 Hormone resistant malignancy status: Secondary | ICD-10-CM | POA: Diagnosis not present

## 2022-02-07 LAB — RAD ONC ARIA SESSION SUMMARY
Course Elapsed Days: 13
Plan Fractions Treated to Date: 9
Plan Prescribed Dose Per Fraction: 3 Gy
Plan Total Fractions Prescribed: 10
Plan Total Prescribed Dose: 30 Gy
Reference Point Dosage Given to Date: 27 Gy
Reference Point Session Dosage Given: 3 Gy
Session Number: 9

## 2022-02-08 ENCOUNTER — Ambulatory Visit: Payer: HMO

## 2022-02-08 ENCOUNTER — Other Ambulatory Visit: Payer: Self-pay | Admitting: Radiation Oncology

## 2022-02-08 ENCOUNTER — Other Ambulatory Visit: Payer: Self-pay

## 2022-02-08 ENCOUNTER — Encounter: Payer: Self-pay | Admitting: Urology

## 2022-02-08 ENCOUNTER — Ambulatory Visit: Admission: RE | Admit: 2022-02-08 | Payer: HMO | Source: Ambulatory Visit

## 2022-02-08 ENCOUNTER — Ambulatory Visit
Admission: RE | Admit: 2022-02-08 | Discharge: 2022-02-08 | Disposition: A | Discharge status: Home / Self Care | Payer: HMO | Source: Ambulatory Visit | Attending: Radiation Oncology | Admitting: Radiation Oncology

## 2022-02-08 ENCOUNTER — Telehealth: Payer: Self-pay

## 2022-02-08 DIAGNOSIS — Z510 Encounter for antineoplastic radiation therapy: Secondary | ICD-10-CM | POA: Diagnosis not present

## 2022-02-08 DIAGNOSIS — C7951 Secondary malignant neoplasm of bone: Secondary | ICD-10-CM | POA: Diagnosis not present

## 2022-02-08 DIAGNOSIS — C61 Malignant neoplasm of prostate: Secondary | ICD-10-CM | POA: Diagnosis not present

## 2022-02-08 DIAGNOSIS — Z192 Hormone resistant malignancy status: Secondary | ICD-10-CM | POA: Diagnosis not present

## 2022-02-08 LAB — RAD ONC ARIA SESSION SUMMARY
Course Elapsed Days: 14
Plan Fractions Treated to Date: 10
Plan Prescribed Dose Per Fraction: 3 Gy
Plan Total Fractions Prescribed: 10
Plan Total Prescribed Dose: 30 Gy
Reference Point Dosage Given to Date: 30 Gy
Reference Point Session Dosage Given: 3 Gy
Session Number: 10

## 2022-02-08 MED ORDER — SUCRALFATE 1 G PO TABS: 1.0000 g | ORAL_TABLET | Freq: Four times a day (QID) | ORAL | 2 refills | Status: DC

## 2022-02-08 NOTE — Telephone Encounter (Signed)
RN called patient after identity confirmed inform him of prescription sent in to his pharmacy.  Patient appreciative of follow-up call and will stop by the pharmacy to pick up medication.  Nothing else follows.

## 2022-02-09 ENCOUNTER — Ambulatory Visit: Payer: HMO

## 2022-02-12 ENCOUNTER — Ambulatory Visit: Payer: HMO

## 2022-02-13 ENCOUNTER — Telehealth: Payer: Self-pay

## 2022-02-13 NOTE — Telephone Encounter (Signed)
RN spoke with patient about medication that was prescribed for esophagitis.  He expressed that medication prescribed helped a little and he was already taking OTC Mylanta, and drinking milk for several days without relief.  Per Freeman Caldron, PA-C ask patient to reach out to PCP for GI referral as next step.  Patient was accepting of information and stated he will contact his PCP.

## 2022-02-14 ENCOUNTER — Other Ambulatory Visit: Payer: PPO

## 2022-02-15 ENCOUNTER — Telehealth: Payer: Self-pay

## 2022-02-15 NOTE — Telephone Encounter (Signed)
A RN with [facility] [employer] called stating Wesley White has esophagitis and a lot of pain with that.

## 2022-02-16 ENCOUNTER — Ambulatory Visit: Payer: PPO | Admitting: Oncology

## 2022-02-16 ENCOUNTER — Ambulatory Visit: Payer: PPO

## 2022-02-19 ENCOUNTER — Encounter: Payer: Self-pay | Admitting: Family Medicine

## 2022-02-19 ENCOUNTER — Ambulatory Visit (INDEPENDENT_AMBULATORY_CARE_PROVIDER_SITE_OTHER): Payer: HMO | Admitting: Family Medicine

## 2022-02-19 VITALS — BP 126/82 | HR 82 | Temp 98.5°F | Wt 222.4 lb

## 2022-02-19 DIAGNOSIS — Z23 Encounter for immunization: Secondary | ICD-10-CM | POA: Diagnosis not present

## 2022-02-19 DIAGNOSIS — C7951 Secondary malignant neoplasm of bone: Secondary | ICD-10-CM | POA: Diagnosis not present

## 2022-02-19 DIAGNOSIS — K208 Other esophagitis without bleeding: Secondary | ICD-10-CM | POA: Diagnosis not present

## 2022-02-19 DIAGNOSIS — T66XXXA Radiation sickness, unspecified, initial encounter: Secondary | ICD-10-CM

## 2022-02-19 DIAGNOSIS — C61 Malignant neoplasm of prostate: Secondary | ICD-10-CM | POA: Diagnosis not present

## 2022-02-19 NOTE — Progress Notes (Signed)
   Subjective:    Patient ID: Wesley White, male    DOB: 12/20/1950, 71 y.o.   MRN: [id number]  HPI He has underlying metastatic prostate cancer and has had several bony lesions the most recent 1 to T8 requiring radiation.  Was told that it might cause some esophageal pain which she did indeed did do.  He has been using sucralfate and Maalox liquid with fairly good results.  He then added Nexium to this.  At this point things seem to be fairly calm.   Review of Systems     Objective:   Physical Exam Alert and in no distress otherwise not examined       Assessment & Plan:  Radiation esophagitis  Need for influenza vaccination - Plan: Flu Vaccine QUAD High Dose(Fluad)  Prostate cancer metastatic to bone [facility]) We discussed his cancer in regard to bony metastases.  He seems to be handling this fairly well.  He is not letting this get him down as he is planning trips.  Explained that if he does have any bony pain in the future to certainly let us know so we can help with further evaluation and treatment.  Also discussed the possible use of viscous Xylocaine in the future if he has any more radiation related esophageal irritation.  He expressed understanding of all this.

## 2022-02-21 ENCOUNTER — Inpatient Hospital Stay: Payer: HMO | Attending: Oncology

## 2022-02-21 ENCOUNTER — Other Ambulatory Visit: Payer: Self-pay

## 2022-02-21 DIAGNOSIS — C7951 Secondary malignant neoplasm of bone: Secondary | ICD-10-CM | POA: Diagnosis not present

## 2022-02-21 DIAGNOSIS — C61 Malignant neoplasm of prostate: Secondary | ICD-10-CM | POA: Diagnosis not present

## 2022-02-21 DIAGNOSIS — Z923 Personal history of irradiation: Secondary | ICD-10-CM | POA: Insufficient documentation

## 2022-02-21 DIAGNOSIS — M546 Pain in thoracic spine: Secondary | ICD-10-CM | POA: Insufficient documentation

## 2022-02-21 DIAGNOSIS — Z5111 Encounter for antineoplastic chemotherapy: Secondary | ICD-10-CM | POA: Diagnosis not present

## 2022-02-21 DIAGNOSIS — Z79899 Other long term (current) drug therapy: Secondary | ICD-10-CM | POA: Diagnosis not present

## 2022-02-21 LAB — CBC WITH DIFFERENTIAL (CANCER CENTER ONLY)
Abs Immature Granulocytes: 0.07 10*3/uL (ref 0.00–0.07)
Basophils Absolute: 0 10*3/uL (ref 0.0–0.1)
Basophils Relative: 1 %
Eosinophils Absolute: 0.1 10*3/uL (ref 0.0–0.5)
Eosinophils Relative: 1 %
HCT: 40.1 % (ref 39.0–52.0)
Hemoglobin: 14.1 g/dL (ref 13.0–17.0)
Immature Granulocytes: 2 %
Lymphocytes Relative: 10 %
Lymphs Abs: 0.4 10*3/uL — ABNORMAL LOW (ref 0.7–4.0)
MCH: 31.2 pg (ref 26.0–34.0)
MCHC: 35.2 g/dL (ref 30.0–36.0)
MCV: 88.7 fL (ref 80.0–100.0)
Monocytes Absolute: 0.4 10*3/uL (ref 0.1–1.0)
Monocytes Relative: 10 %
Neutro Abs: 3.3 10*3/uL (ref 1.7–7.7)
Neutrophils Relative %: 76 %
Platelet Count: 223 10*3/uL (ref 150–400)
RBC: 4.52 MIL/uL (ref 4.22–5.81)
RDW: 14.2 % (ref 11.5–15.5)
WBC Count: 4.3 10*3/uL (ref 4.0–10.5)
nRBC: 0 % (ref 0.0–0.2)

## 2022-02-21 LAB — CMP (CANCER CENTER ONLY)
ALT: 14 U/L (ref 0–44)
AST: 13 U/L — ABNORMAL LOW (ref 15–41)
Albumin: 4.1 g/dL (ref 3.5–5.0)
Alkaline Phosphatase: 52 U/L (ref 38–126)
Anion gap: 7 (ref 5–15)
BUN: 15 mg/dL (ref 8–23)
CO2: 24 mmol/L (ref 22–32)
Calcium: 9.4 mg/dL (ref 8.9–10.3)
Chloride: 104 mmol/L (ref 98–111)
Creatinine: 0.64 mg/dL (ref 0.61–1.24)
GFR, Estimated: >60 mL/min (ref >60)
Glucose, Bld: 217 mg/dL — ABNORMAL HIGH (ref 70–99)
Potassium: 4.2 mmol/L (ref 3.5–5.1)
Sodium: 135 mmol/L (ref 135–145)
Total Bilirubin: 0.4 mg/dL (ref 0.3–1.2)
Total Protein: 6.9 g/dL (ref 6.5–8.1)

## 2022-02-23 ENCOUNTER — Other Ambulatory Visit: Payer: Self-pay

## 2022-02-23 ENCOUNTER — Inpatient Hospital Stay (HOSPITAL_BASED_OUTPATIENT_CLINIC_OR_DEPARTMENT_OTHER): Payer: HMO | Admitting: Oncology

## 2022-02-23 ENCOUNTER — Inpatient Hospital Stay: Payer: HMO

## 2022-02-23 VITALS — BP 127/79 | HR 101 | Temp 97.7°F | Resp 16 | Wt 222.2 lb

## 2022-02-23 DIAGNOSIS — C61 Malignant neoplasm of prostate: Secondary | ICD-10-CM

## 2022-02-23 DIAGNOSIS — C7951 Secondary malignant neoplasm of bone: Secondary | ICD-10-CM | POA: Diagnosis not present

## 2022-02-23 DIAGNOSIS — Z5111 Encounter for antineoplastic chemotherapy: Secondary | ICD-10-CM | POA: Diagnosis not present

## 2022-02-23 LAB — PROSTATE-SPECIFIC AG, SERUM (LABCORP): Prostate Specific Ag, Serum: 0.4 ng/mL (ref 0.0–4.0)

## 2022-02-23 MED ORDER — DENOSUMAB 120 MG/1.7ML ~~LOC~~ SOLN
120.0000 mg | Freq: Once | SUBCUTANEOUS | Status: AC
  Administered 2022-02-23: 120 mg via SUBCUTANEOUS
  Filled 2022-02-23: qty 1.7

## 2022-02-23 MED ORDER — LEUPROLIDE ACETATE (4 MONTH) 30 MG ~~LOC~~ KIT
30.0000 mg | PACK | Freq: Once | SUBCUTANEOUS | Status: AC
  Administered 2022-02-23: 30 mg via SUBCUTANEOUS
  Filled 2022-02-23: qty 30

## 2022-02-23 NOTE — Progress Notes (Signed)
Hematology and Oncology Follow Up   Wesley White [id number] 1951/02/21 71 y.o. 02/23/2022 2:37 PM [name], MDLalonde, [name], MD     Principle Diagnosis: 71 year old man with advanced prostate cancer to the bone diagnosed in 2020.  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    Prior Therapy:   He is status post laparoscopic radical prostatectomy and pelvic lymphadenectomy on March 05, 2016.  The final pathology showed Gleason score 4+3 equal 7 with 0 out of 9 lymph node involved.  He did have extraprostatic extension at that time.   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  He is status post radiation therapy adjuvantly under the care of Dr. Tammi Klippel completed on May 4 of 2018.  He received 68.4 Gray in 38 fractions.  Status post radiation therapy for isolated left iliac bone metastasis completed in June 2020.  Zytiga 1000 mg daily started in August 2020.  Therapy discontinued because of lack of coverage   He developed worsening back pain and a rise in his PSA up to 2.7 with MRI showed worsening disease in the spine including T12, T1 and T5.  He is status post radiation therapy to the T11 and L4 completed on February 04, 2020 after completing 30 Gray in 10 fractions.   He is status post radiation therapy to the T8 isolated spinal metastasis completed in September 2023.  He received 30 Gray in 10 fractions.   Current therapy:   Eligard 30 mg every 4 months.  He will receive injection today and repeated in 4 months.  Xgeva 120 mg every 2 months.  This will be repeated in 2 months after today's injection.  Xtandi 160 mg daily started on January 20, 2020    Interim History: Wesley White presents today for a repeat follow-up visit.  Since last visit, he completed radiation therapy to the thoracic spine in the last 2 weeks.  He did develop mild esophagitis which has resolved at this time.  He also  reported significant improvement in his back pain.  He denies any chest pain or shortness of breath.  He denies any dyspepsia.  His performance status and quality of life remained excellent.            Medications: Updated on review. Current Outpatient Medications  Medication Sig Dispense Refill   acetaminophen (TYLENOL) 325 MG tablet Take 650 mg by mouth every 6 (six) hours as needed for mild pain.      enzalutamide (XTANDI) 40 MG tablet TAKE 4 TABLETS (160 MG TOTAL) BY MOUTH DAILY. 120 tablet 3   esomeprazole (NEXIUM) 20 MG capsule Take 20 mg by mouth daily at 12 noon.     ezetimibe (ZETIA) 10 MG tablet Take 1 tablet (10 mg total) by mouth daily. 90 tablet 3   ibuprofen (ADVIL) 600 MG tablet Take 600 mg by mouth every 6 (six) hours as needed.     INVOKANA 300 MG TABS tablet TAKE 1 TABLET BY MOUTH EVERY DAY BEFORE BREAKFAST 90 tablet 0   lisinopril (ZESTRIL) 5 MG tablet TAKE 1 TABLET BY MOUTH EVERY DAY 90 tablet 3   metFORMIN (GLUCOPHAGE) 850 MG tablet Take 1 tablet (850 mg total) by mouth 2 (two) times daily with a meal. 180 tablet 1   naproxen sodium (ALEVE) 220 MG tablet Take 220 mg by mouth daily as needed.     OneTouch Delica Lancets 56E MISC See admin  instructions.     ONETOUCH VERIO test strip USE AS DIRECTED AS NEEDED 100 strip 11   rosuvastatin (CRESTOR) 40 MG tablet TAKE 1 TABLET BY MOUTH EVERY DAY 90 tablet 1   sucralfate (CARAFATE) 1 g tablet Take 1 tablet (1 g total) by mouth 4 (four) times daily. Dissolve each tablet in 15 cc water before use. 120 tablet 2   tadalafil (CIALIS) 20 MG tablet Take 1 tablet (20 mg total) by mouth daily as needed for erectile dysfunction. 30 tablet 1   venlafaxine XR (EFFEXOR-XR) 37.5 MG 24 hr capsule TAKE 1 CAPSULE BY MOUTH DAILY WITH BREAKFAST. 30 capsule 1   No current facility-administered medications for this visit.   Facility-Administered Medications Ordered in Other Visits  Medication Dose Route Frequency Provider Last Rate Last  Admin   magnesium citrate solution 1 Bottle  1 Bottle Oral Once Raynelle Bring, MD       sodium phosphate (FLEET) 7-19 GM/118ML enema 1 enema  1 enema Rectal Once Raynelle Bring, MD           Physical Exam:         Blood pressure 127/79, pulse (!) 101, temperature 97.7 F (36.5 C), temperature source Oral, resp. rate 16, weight 222 lb 3.2 oz (100.8 kg), SpO2 93 %.         ECOG: 1   General appearance: Alert, awake without any distress. Head: Atraumatic without abnormalities Oropharynx: Without any thrush or ulcers. Eyes: No scleral icterus. Lymph nodes: No lymphadenopathy noted in the cervical, supraclavicular, or axillary nodes Heart:regular rate and rhythm, without any murmurs or gallops.   Lung: Clear to auscultation without any rhonchi, wheezes or dullness to percussion. Abdomin: Soft, nontender without any shifting dullness or ascites. Musculoskeletal: No clubbing or cyanosis. Neurological: No motor or sensory deficits. Skin: No rashes or lesions.                    Lab Results: Lab Results  Component Value Date   WBC 4.3 02/21/2022   HGB 14.1 02/21/2022   HCT 40.1 02/21/2022   MCV 88.7 02/21/2022   PLT 223 02/21/2022   PSA 10.03 (H) 09/22/2015   PSA 10.03 (H) 09/22/2015     Chemistry      Component Value Date/Time   NA 135 02/21/2022 1312   NA 136 10/17/2018 1139   K 4.2 02/21/2022 1312   CL 104 02/21/2022 1312   CO2 24 02/21/2022 1312   BUN 15 02/21/2022 1312   BUN 11 10/17/2018 1139   CREATININE 0.64 02/21/2022 1312   CREATININE 0.81 06/08/2015 0001      Component Value Date/Time   CALCIUM 9.4 02/21/2022 1312   ALKPHOS 52 02/21/2022 1312   AST 13 (L) 02/21/2022 1312   ALT 14 02/21/2022 1312   BILITOT 0.4 02/21/2022 1312        Latest Reference Range & Units 09/27/21 08:51 12/11/21 13:01 02/21/22 13:12  Prostate Specific Ag, Serum 0.0 - 4.0 ng/mL <0.1 0.2 0.4        Impression and Plan:   71 year old man  with:  1.  Advanced prostate cancer with disease to the bone diagnosed in 2020.  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  His PSA is showing a slight increase currently to 0.4 although he received radiation therapy to oligo progression T8 lesion.  Management options moving forward were discussed which include continuing Xtandi versus switching to Taxotere chemotherapy or a PARP inhibitor.  We will obtain Guardant360 analysis  before the next visit and recommended monitoring for the time being.  He is agreeable to proceed with this plan.     2.  T8 sclerotic lesion noted on CT scan and bone scan in June 2023.  He is status post radiation therapy completed in September 2023.  3.  Androgen deprivation: This will be received today and repeated in 4 months.  Complications including weight gain and hot flashes.  4.  Bone directed therapy: He will proceed with Xgeva today.  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  5.  Prognosis and goals of care: His disease is incurable although aggressive measures are warranted given his excellent performance status and limited disease.   6.  Follow-up: In 2 months for a follow-up.   30  minutes were dedicated to this visit.  The time was spent on updating disease status, treatment choices and addressing complications related to his cancer therapy.   Zola Button, MD 02/23/2022 2:37 PM

## 2022-03-02 ENCOUNTER — Encounter: Payer: Self-pay | Admitting: *Deleted

## 2022-03-02 ENCOUNTER — Telehealth: Payer: Self-pay | Admitting: Family Medicine

## 2022-03-02 NOTE — Telephone Encounter (Signed)
Pt called in and is going out of the country next Thursday and where he will be traveling wants a letter written by his doctor stating medications he is taking, what they are needed for and the dosage amount. He said he can pick up the letter or we can email him the letter [email or web address] .[email or web address]

## 2022-03-14 ENCOUNTER — Telehealth: Payer: Self-pay | Admitting: Oncology

## 2022-03-14 NOTE — Telephone Encounter (Signed)
Called patient regarding upcoming November and December appointments, patient is notified. 

## 2022-03-19 ENCOUNTER — Encounter: Payer: Self-pay | Admitting: Internal Medicine

## 2022-03-20 ENCOUNTER — Encounter: Payer: Self-pay | Admitting: Internal Medicine

## 2022-03-22 ENCOUNTER — Ambulatory Visit: Payer: HMO | Admitting: Urology

## 2022-03-29 ENCOUNTER — Other Ambulatory Visit (HOSPITAL_COMMUNITY): Payer: Self-pay

## 2022-03-30 ENCOUNTER — Telehealth: Payer: Self-pay | Admitting: Pharmacy Technician

## 2022-03-30 NOTE — Telephone Encounter (Signed)
Oral Oncology Patient Advocate Encounter   Received notification that patient is due for re-enrollment for assistance for Xtandi through [employer] process has been initiated and will be submitted upon completion of necessary documents.  Re-enrollment requires POI.  [employer] number [phone].   I will continue to follow until final determination.  Lady Deutscher, CPhT-Adv Oncology Pharmacy Patient [place] Direct Number: [phone]  Fax: [phone]

## 2022-04-04 ENCOUNTER — Other Ambulatory Visit (HOSPITAL_COMMUNITY): Payer: Self-pay

## 2022-04-06 ENCOUNTER — Ambulatory Visit (INDEPENDENT_AMBULATORY_CARE_PROVIDER_SITE_OTHER): Payer: HMO

## 2022-04-06 VITALS — Ht 69.0 in | Wt 220.0 lb

## 2022-04-06 DIAGNOSIS — Z0000 Encounter for general adult medical examination without abnormal findings: Secondary | ICD-10-CM | POA: Diagnosis not present

## 2022-04-06 NOTE — Patient Instructions (Signed)
Wesley White , Thank you for taking time to come for your Medicare Wellness Visit. I appreciate your ongoing commitment to your health goals. Please review the following plan we discussed and let me know if I can assist you in the future.   Screening recommendations/referrals: Colonoscopy: completed 04/17/2012, due 04/17/2022 Recommended yearly ophthalmology/optometry visit for glaucoma screening and checkup Recommended yearly dental visit for hygiene and checkup  Vaccinations: Influenza vaccine: completed 02/19/2022 Pneumococcal vaccine: completed 02/05/2019 Tdap vaccine: due Shingles vaccine: discussed   Covid-19:  02/28/2022, 05/31/2021, 09/07/2020, 07/29/2019, 07/04/2019  Advanced directives: Please bring a copy of your POA (Power of Attorney) and/or Living Will to your next appointment.   Conditions/risks identified: none  Next appointment: Follow up in one year for your annual wellness visit.   Preventive Care 64 Years and Older, Male Preventive care refers to lifestyle choices and visits with your health care provider that can promote health and wellness. What does preventive care include? A yearly physical exam. This is also called an annual well check. Dental exams once or twice a year. Routine eye exams. Ask your health care provider how often you should have your eyes checked. Personal lifestyle choices, including: Daily care of your teeth and gums. Regular physical activity. Eating a healthy diet. Avoiding tobacco and drug use. Limiting alcohol use. Practicing safe sex. Taking low doses of aspirin every day. Taking vitamin and mineral supplements as recommended by your health care provider. What happens during an annual well check? The services and screenings done by your health care provider during your annual well check will depend on your age, overall health, lifestyle risk factors, and family history of disease. Counseling  Your health care provider may ask you questions  about your: Alcohol use. Tobacco use. Drug use. Emotional well-being. Home and relationship well-being. Sexual activity. Eating habits. History of falls. Memory and ability to understand (cognition). Work and work Statistician. Screening  You may have the following tests or measurements: Height, weight, and BMI. Blood pressure. Lipid and cholesterol levels. These may be checked every 5 years, or more frequently if you are over 20 years old. Skin check. Lung cancer screening. You may have this screening every year starting at age 42 if you have a 30-pack-year history of smoking and currently smoke or have quit within the past 15 years. Fecal occult blood test (FOBT) of the stool. You may have this test every year starting at age 5. Flexible sigmoidoscopy or colonoscopy. You may have a sigmoidoscopy every 5 years or a colonoscopy every 10 years starting at age 27. Prostate cancer screening. Recommendations will vary depending on your family history and other risks. Hepatitis C blood test. Hepatitis B blood test. Sexually transmitted disease (STD) testing. Diabetes screening. This is done by checking your blood sugar (glucose) after you have not eaten for a while (fasting). You may have this done every 1-3 years. Abdominal aortic aneurysm (AAA) screening. You may need this if you are a current or former smoker. Osteoporosis. You may be screened starting at age 80 if you are at high risk. Talk with your health care provider about your test results, treatment options, and if necessary, the need for more tests. Vaccines  Your health care provider may recommend certain vaccines, such as: Influenza vaccine. This is recommended every year. Tetanus, diphtheria, and acellular pertussis (Tdap, Td) vaccine. You may need a Td booster every 10 years. Zoster vaccine. You may need this after age 2. Pneumococcal 13-valent conjugate (PCV13) vaccine. One dose is  recommended after age 66. Pneumococcal  polysaccharide (PPSV23) vaccine. One dose is recommended after age 43. Talk to your health care provider about which screenings and vaccines you need and how often you need them. This information is not intended to replace advice given to you by your health care provider. Make sure you discuss any questions you have with your health care provider. Document Released: 06/10/2015 Document Revised: 02/01/2016 Document Reviewed: 03/15/2015 Elsevier Interactive Patient Education  2017 [place] Prevention in the Home Falls can cause injuries. They can happen to people of all ages. There are many things you can do to make your home safe and to help prevent falls. What can I do on the outside of my home? Regularly fix the edges of walkways and driveways and fix any cracks. Remove anything that might make you trip as you walk through a door, such as a raised step or threshold. Trim any bushes or trees on the path to your home. Use bright outdoor lighting. Clear any walking paths of anything that might make someone trip, such as rocks or tools. Regularly check to see if handrails are loose or broken. Make sure that both sides of any steps have handrails. Any raised decks and porches should have guardrails on the edges. Have any leaves, snow, or ice cleared regularly. Use sand or salt on walking paths during winter. Clean up any spills in your garage right away. This includes oil or grease spills. What can I do in the bathroom? Use night lights. Install grab bars by the toilet and in the tub and shower. Do not use towel bars as grab bars. Use non-skid mats or decals in the tub or shower. If you need to sit down in the shower, use a plastic, non-slip stool. Keep the floor dry. Clean up any water that spills on the floor as soon as it happens. Remove soap buildup in the tub or shower regularly. Attach bath mats securely with double-sided non-slip rug tape. Do not have throw rugs and other  things on the floor that can make you trip. What can I do in the bedroom? Use night lights. Make sure that you have a light by your bed that is easy to reach. Do not use any sheets or blankets that are too big for your bed. They should not hang down onto the floor. Have a firm chair that has side arms. You can use this for support while you get dressed. Do not have throw rugs and other things on the floor that can make you trip. What can I do in the kitchen? Clean up any spills right away. Avoid walking on wet floors. Keep items that you use a lot in easy-to-reach places. If you need to reach something above you, use a strong step stool that has a grab bar. Keep electrical cords out of the way. Do not use floor polish or wax that makes floors slippery. If you must use wax, use non-skid floor wax. Do not have throw rugs and other things on the floor that can make you trip. What can I do with my stairs? Do not leave any items on the stairs. Make sure that there are handrails on both sides of the stairs and use them. Fix handrails that are broken or loose. Make sure that handrails are as long as the stairways. Check any carpeting to make sure that it is firmly attached to the stairs. Fix any carpet that is loose or worn. Avoid having  throw rugs at the top or bottom of the stairs. If you do have throw rugs, attach them to the floor with carpet tape. Make sure that you have a light switch at the top of the stairs and the bottom of the stairs. If you do not have them, ask someone to add them for you. What else can I do to help prevent falls? Wear shoes that: Do not have high heels. Have rubber bottoms. Are comfortable and fit you well. Are closed at the toe. Do not wear sandals. If you use a stepladder: Make sure that it is fully opened. Do not climb a closed stepladder. Make sure that both sides of the stepladder are locked into place. Ask someone to hold it for you, if possible. Clearly  mark and make sure that you can see: Any grab bars or handrails. First and last steps. Where the edge of each step is. Use tools that help you move around (mobility aids) if they are needed. These include: Canes. Walkers. Scooters. Crutches. Turn on the lights when you go into a dark area. Replace any light bulbs as soon as they burn out. Set up your furniture so you have a clear path. Avoid moving your furniture around. If any of your floors are uneven, fix them. If there are any pets around you, be aware of where they are. Review your medicines with your doctor. Some medicines can make you feel dizzy. This can increase your chance of falling. Ask your doctor what other things that you can do to help prevent falls. This information is not intended to replace advice given to you by your health care provider. Make sure you discuss any questions you have with your health care provider. Document Released: 03/10/2009 Document Revised: 10/20/2015 Document Reviewed: 06/18/2014 Elsevier Interactive Patient Education  2017 [employer].

## 2022-04-06 NOTE — Progress Notes (Signed)
I connected with Wesley White today by telephone and verified that I am speaking with the correct person using two identifiers. Location patient: home Location provider: work Persons participating in the virtual visit: Wesley White, Glenna Durand LPN.   I discussed the limitations, risks, security and privacy concerns of performing an evaluation and management service by telephone and the availability of in person appointments. I also discussed with the patient that there may be a patient responsible charge related to this service. The patient expressed understanding and verbally consented to this telephonic visit.    Interactive audio and video telecommunications were attempted between this provider and patient, however failed, due to patient having technical difficulties OR patient did not have access to video capability.  We continued and completed visit with audio only.     Vital signs may be patient reported or missing.  Subjective:   Wesley White is a 71 y.o. male who presents for Medicare Annual/Subsequent preventive examination.  Review of Systems     Cardiac Risk Factors include: advanced age (>86mn, >>6women);diabetes mellitus;dyslipidemia;male gender;obesity (BMI >30kg/m2)     Objective:    Today's Vitals   04/06/22 0956  Weight: 220 lb (99.8 kg)  Height: '5\' 9"'$  (1.753 m)   Body mass index is 32.49 kg/m.     04/06/2022   10:06 AM 01/19/2022    8:39 AM 03/28/2021    1:36 PM 03/08/2020   11:59 AM 03/08/2020   11:52 AM 01/15/2020    1:04 PM 01/12/2020    8:25 AM  Advanced Directives  Does Patient Have a Medical Advance Directive? Yes Yes Yes  Yes Yes No  Type of AParamedicof ASenecaLiving will Living will;Healthcare Power of Attorney Living will  Living will HTrappeLiving will   Does patient want to make changes to medical advance directive?  No - Patient declined   No - Patient declined    Copy of HRobardsin Chart? No - copy requested      No - copy requested  Would patient like information on creating a medical advance directive?    No - Patient declined   No - Patient declined    Current Medications (verified) Outpatient Encounter Medications as of 04/06/2022  Medication Sig   acetaminophen (TYLENOL) 325 MG tablet Take 650 mg by mouth every 6 (six) hours as needed for mild pain.    enzalutamide (XTANDI) 40 MG tablet TAKE 4 TABLETS (160 MG TOTAL) BY MOUTH DAILY.   esomeprazole (NEXIUM) 20 MG capsule Take 20 mg by mouth daily at 12 noon.   ibuprofen (ADVIL) 600 MG tablet Take 600 mg by mouth every 6 (six) hours as needed.   INVOKANA 300 MG TABS tablet TAKE 1 TABLET BY MOUTH EVERY DAY BEFORE BREAKFAST   lisinopril (ZESTRIL) 5 MG tablet TAKE 1 TABLET BY MOUTH EVERY DAY   metFORMIN (GLUCOPHAGE) 850 MG tablet Take 1 tablet (850 mg total) by mouth 2 (two) times daily with a meal.   naproxen sodium (ALEVE) 220 MG tablet Take 220 mg by mouth daily as needed.   OneTouch Delica Lancets [id number] See admin instructions.   ONETOUCH VERIO test strip USE AS DIRECTED AS NEEDED   rosuvastatin (CRESTOR) 40 MG tablet TAKE 1 TABLET BY MOUTH EVERY DAY   sucralfate (CARAFATE) 1 g tablet Take 1 tablet (1 g total) by mouth 4 (four) times daily. Dissolve each tablet in 15 cc water before use.   tadalafil (CIALIS)  20 MG tablet Take 1 tablet (20 mg total) by mouth daily as needed for erectile dysfunction.   ezetimibe (ZETIA) 10 MG tablet Take 1 tablet (10 mg total) by mouth daily. (Patient not taking: Reported on 04/06/2022)   venlafaxine XR (EFFEXOR-XR) 37.5 MG 24 hr capsule TAKE 1 CAPSULE BY MOUTH DAILY WITH BREAKFAST. (Patient not taking: Reported on 04/06/2022)   Facility-Administered Encounter Medications as of 04/06/2022  Medication   magnesium citrate solution 1 Bottle   sodium phosphate (FLEET) 7-19 GM/118ML enema 1 enema    Allergies (verified) Patient has no known allergies.   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[place])    Chronic kidney disease    hx kidney stone year ago   Prediabetes    Prostate cancer [facility])    Sleep apnea    Past Surgical History:  Procedure Laterality Date   HERNIA REPAIR     1983 [facility]    LITHOTRIPSY     2011 by tannenbaum   LYMPHADENECTOMY Bilateral 03/05/2016   Procedure: PELVIC LYMPHADENECTOMY;  Surgeon: Raynelle Bring, MD;  Location: [facility];  Service: Urology;  Laterality: Bilateral;   PROSTATE BIOPSY     ROBOT ASSISTED LAPAROSCOPIC RADICAL PROSTATECTOMY N/A 03/05/2016   Procedure: XI ROBOTIC ASSISTED LAPAROSCOPIC RADICAL PROSTATECTOMY LEVEL 2;  Surgeon: Raynelle Bring, MD;  Location: [facility];  Service: Urology;  Laterality: N/A;   Family History  Problem Relation Age of Onset   Cancer Father 29       Lymphoma   Cancer Brother 4       Prostate   Diabetes Mother    Hyperlipidemia Mother    Hypertension Mother    Bipolar disorder Daughter    Colon cancer Neg Hx    Esophageal cancer Neg Hx    Rectal cancer Neg Hx    Stomach cancer Neg Hx    Social History   Socioeconomic History   Marital status: Married    Spouse name: Not on file   Number of children: Not on file   Years of education: Not on file   Highest education level: Not on file  Occupational History   Not on file  Tobacco Use   Smoking status: Never   Smokeless tobacco: Never  Vaping Use   Vaping Use: Never used  Substance and Sexual Activity   Alcohol use: Yes    Comment: rare   Drug use: No   Sexual activity: Yes  Other Topics Concern   Not on file  Social History Narrative   Not on file   Social Determinants of Health   Financial Resource Strain: Low Risk  (04/06/2022)   Overall Financial Resource Strain (CARDIA)    Difficulty of Paying Living Expenses: Not hard at all  Food Insecurity: No Food Insecurity (04/06/2022)   Hunger Vital Sign    Worried About Running Out of Food in the Last Year: Never true    Ran Out of Food in  the Last Year: Never true  Transportation Needs: No Transportation Needs (04/06/2022)   PRAPARE - Hydrologist (Medical): No    Lack of Transportation (Non-Medical): No  Physical Activity: Sufficiently Active (04/06/2022)   Exercise Vital Sign    Days of Exercise per Week: 5 days    Minutes of Exercise per Session: 30 min  Stress: No Stress Concern Present (04/06/2022)   [place]    Feeling of Stress :  Not at all  Social Connections: Not on file    Tobacco Counseling Counseling given: Not Answered   Clinical Intake:  Pre-visit preparation completed: Yes  Pain : No/denies pain     Nutritional Status: BMI > 30  Obese Nutritional Risks: Nausea/ vomitting/ diarrhea (some diarrhea after visit to [place], resolving) Diabetes: Yes  How often do you need to have someone help you when you read instructions, pamphlets, or other written materials from your doctor or pharmacy?: 1 - Never  Diabetic? no  Interpreter Needed?: No  Information entered by :: NAllen LPN   Activities of Daily Living    04/06/2022   10:09 AM  In your present state of health, do you have any difficulty performing the following activities:  Hearing? 0  Vision? 0  Difficulty concentrating or making decisions? 0  Walking or climbing stairs? 1  Comment if more stairs  Dressing or bathing? 0  Doing errands, shopping? 0  Preparing Food and eating ? N  Using the Toilet? N  In the past six months, have you accidently leaked urine? Y  Comment due to prostate cancer  Do you have problems with loss of bowel control? N  Managing your Medications? N  Managing your Finances? N  Housekeeping or managing your Housekeeping? N    Patient Care Team: [name], MD as PCP - General Cira Rue, RN Nurse Navigator as Registered Nurse (Medical Oncology)  Indicate any recent Medical Services you may have received  from other than [facility] providers in the past year (date may be approximate).     Assessment:   This is a routine wellness examination for Wesley White.  Hearing/Vision screen Vision Screening - Comments:: Regular eye exams, [facility]  Dietary issues and exercise activities discussed: Current Exercise Habits: Home exercise routine, Type of exercise: walking, Time (Minutes): 30, Frequency (Times/Week): 5, Weekly Exercise (Minutes/Week): 150   Goals Addressed             This Visit's Progress    Patient Stated       04/06/2022, keep walking enough to lose weight       Depression Screen    04/06/2022   10:07 AM 03/28/2021    1:38 PM 09/15/2020   11:03 AM 01/12/2020    8:27 AM 06/15/2019    1:35 PM 11/20/2018   10:08 AM 07/04/2017    2:17 PM  PHQ 2/9 Scores  PHQ - 2 Score 0 0 0 0 0 0 0  PHQ- 9 Score 2          Fall Risk    04/06/2022   10:06 AM 03/28/2021    1:37 PM 09/15/2020   11:03 AM 01/12/2020    8:26 AM 12/22/2019   12:58 PM  Fall Risk   Falls in the past year? 1 1 0 0 0  Comment fell in [place]    Emmi Telephone Survey: data to providers prior to load  Number falls in past yr: 0 1 0    Comment  this september while hiking     Injury with Fall? 0 0 0    Risk for fall due to : Medication side effect No Fall Risks No Fall Risks No Fall Risks   Follow up Falls evaluation completed;Education provided;Falls prevention discussed Falls evaluation completed Falls evaluation completed      FALL RISK PREVENTION PERTAINING TO THE HOME:  Any stairs in or around the home? Yes  If so, are there any without handrails? No  Home free of loose throw rugs in walkways, pet beds, electrical cords, etc? Yes  Adequate lighting in your home to reduce risk of falls? Yes   ASSISTIVE DEVICES UTILIZED TO PREVENT FALLS:  Life alert? No  Use of a cane, walker or w/c? No  Grab bars in the bathroom? No  Shower chair or bench in shower? Yes  Elevated toilet seat or a handicapped toilet? No    TIMED UP AND GO:  Was the test performed? No .      Cognitive Function:        04/06/2022   10:13 AM  6CIT Screen  What Year? 0 points  What month? 0 points  What time? 0 points  Count back from 20 0 points  Months in reverse 0 points  Repeat phrase 0 points  Total Score 0 points    Immunizations Immunization History  Administered Date(s) Administered   Fluad Quad(high Dose 65+) 02/05/2019, 03/28/2021, 02/19/2022   Influenza, High Dose Seasonal PF 04/25/2017, 06/20/2018   Influenza,inj,Quad PF,6+ Mos 03/06/2016   PFIZER Comirnaty(Gray Top)Covid-19 Tri-Sucrose Vaccine 09/07/2020   PFIZER(Purple Top)SARS-COV-2 Vaccination 07/04/2019, 07/29/2019, 09/07/2020   Pfizer Covid-19 Vaccine Bivalent Booster 45yr & up 05/31/2021   Pneumococcal Conjugate-13 02/05/2019   Pneumococcal Polysaccharide-23 03/06/2016, 04/25/2017   Tdap 03/17/2012   Unspecified SARS-COV-2 Vaccination 02/28/2022   Zoster, Live 03/17/2012    TDAP status: Due, Education has been provided regarding the importance of this vaccine. Advised may receive this vaccine at local pharmacy or Health Dept. Aware to provide a copy of the vaccination record if obtained from local pharmacy or Health Dept. Verbalized acceptance and understanding.  Flu Vaccine status: Up to date  Pneumococcal vaccine status: Up to date  Covid-19 vaccine status: Completed vaccines  Qualifies for Shingles Vaccine? Yes   Zostavax completed Yes   Shingrix Completed?: No.    Education has been provided regarding the importance of this vaccine. Patient has been advised to call insurance company to determine out of pocket expense if they have not yet received this vaccine. Advised may also receive vaccine at local pharmacy or Health Dept. Verbalized acceptance and understanding.  Screening Tests Health Maintenance  Topic Date Due   Zoster Vaccines- Shingrix (1 of 2) Never done   Diabetic kidney evaluation - Urine ACR  09/15/2021    TETANUS/TDAP  03/17/2022   Medicare Annual Wellness (AWV)  03/28/2022   COLONOSCOPY (Pts 45-42yrInsurance coverage will need to be confirmed)  04/17/2022   COVID-19 Vaccine (7 - Pfizer risk series) 04/25/2022   HEMOGLOBIN A1C  04/28/2022   FOOT EXAM  07/13/2022   OPHTHALMOLOGY EXAM  10/28/2022   Diabetic kidney evaluation - GFR measurement  02/22/2023   Pneumonia Vaccine 6535Years old  Completed   INFLUENZA VACCINE  Completed   Hepatitis C Screening  Completed   HPV VACCINES  Aged Out    Health Maintenance  Health Maintenance Due  Topic Date Due   Zoster Vaccines- Shingrix (1 of 2) Never done   Diabetic kidney evaluation - Urine ACR  09/15/2021   TETANUS/TDAP  03/17/2022   Medicare Annual Wellness (AWV)  03/28/2022    Colorectal cancer screening: Type of screening: Colonoscopy. Completed 04/17/2012. Repeat every 10 years  Lung Cancer Screening: (Low Dose CT Chest recommended if Age 71-80ears, 30 pack-year currently smoking OR have quit w/in 15years.) does not qualify.   Lung Cancer Screening Referral: no  Additional Screening:  Hepatitis C Screening: does qualify; Completed 07/28/2015  Vision Screening: Recommended annual  ophthalmology exams for early detection of glaucoma and other disorders of the eye. Is the patient up to date with their annual eye exam?  Yes  Who is the provider or what is the name of the office in which the patient attends annual eye exams? [facility] If pt is not established with a provider, would they like to be referred to a provider to establish care? No .   Dental Screening: Recommended annual dental exams for proper oral hygiene  Community Resource Referral / Chronic Care Management: CRR required this visit?  No   CCM required this visit?  No      Plan:     I have personally reviewed and noted the following in the patient's chart:   Medical and social history Use of alcohol, tobacco or illicit drugs  Current medications and  supplements including opioid prescriptions. Patient is not currently taking opioid prescriptions. Functional ability and status Nutritional status Physical activity Advanced directives List of other physicians Hospitalizations, surgeries, and ER visits in previous 12 months Vitals Screenings to include cognitive, depression, and falls Referrals and appointments  In addition, I have reviewed and discussed with patient certain preventive protocols, quality metrics, and best practice recommendations. A written personalized care plan for preventive services as well as general preventive health recommendations were provided to patient.     Kellie Simmering, LPN   [id number]   Nurse Notes: none  Due to this being a virtual visit, the after visit summary with patients personalized plan was offered to patient via mail or my-chart.  Patient would like to access on my-chart

## 2022-04-12 ENCOUNTER — Ambulatory Visit: Payer: HMO | Admitting: Family Medicine

## 2022-04-13 ENCOUNTER — Other Ambulatory Visit: Payer: Self-pay

## 2022-04-13 ENCOUNTER — Inpatient Hospital Stay: Payer: HMO | Attending: Oncology

## 2022-04-13 DIAGNOSIS — C61 Malignant neoplasm of prostate: Secondary | ICD-10-CM | POA: Insufficient documentation

## 2022-04-13 DIAGNOSIS — C7951 Secondary malignant neoplasm of bone: Secondary | ICD-10-CM | POA: Insufficient documentation

## 2022-04-13 DIAGNOSIS — Z79899 Other long term (current) drug therapy: Secondary | ICD-10-CM | POA: Insufficient documentation

## 2022-04-13 LAB — CBC WITH DIFFERENTIAL (CANCER CENTER ONLY)
Abs Immature Granulocytes: 0.05 10*3/uL (ref 0.00–0.07)
Basophils Absolute: 0 10*3/uL (ref 0.0–0.1)
Basophils Relative: 1 %
Eosinophils Absolute: 0.1 10*3/uL (ref 0.0–0.5)
Eosinophils Relative: 2 %
HCT: 40.1 % (ref 39.0–52.0)
Hemoglobin: 13.3 g/dL (ref 13.0–17.0)
Immature Granulocytes: 1 %
Lymphocytes Relative: 12 %
Lymphs Abs: 0.7 10*3/uL (ref 0.7–4.0)
MCH: 31.1 pg (ref 26.0–34.0)
MCHC: 33.2 g/dL (ref 30.0–36.0)
MCV: 93.9 fL (ref 80.0–100.0)
Monocytes Absolute: 0.5 10*3/uL (ref 0.1–1.0)
Monocytes Relative: 9 %
Neutro Abs: 4.2 10*3/uL (ref 1.7–7.7)
Neutrophils Relative %: 75 %
Platelet Count: 285 10*3/uL (ref 150–400)
RBC: 4.27 MIL/uL (ref 4.22–5.81)
RDW: 14.5 % (ref 11.5–15.5)
WBC Count: 5.5 10*3/uL (ref 4.0–10.5)
nRBC: 0 % (ref 0.0–0.2)

## 2022-04-13 LAB — CMP (CANCER CENTER ONLY)
ALT: 13 U/L (ref 0–44)
AST: 13 U/L — ABNORMAL LOW (ref 15–41)
Albumin: 4.3 g/dL (ref 3.5–5.0)
Alkaline Phosphatase: 64 U/L (ref 38–126)
Anion gap: 8 (ref 5–15)
BUN: 18 mg/dL (ref 8–23)
CO2: 28 mmol/L (ref 22–32)
Calcium: 9.8 mg/dL (ref 8.9–10.3)
Chloride: 103 mmol/L (ref 98–111)
Creatinine: 0.57 mg/dL — ABNORMAL LOW (ref 0.61–1.24)
GFR, Estimated: >60 mL/min (ref >60)
Glucose, Bld: 132 mg/dL — ABNORMAL HIGH (ref 70–99)
Potassium: 5.4 mmol/L — ABNORMAL HIGH (ref 3.5–5.1)
Sodium: 139 mmol/L (ref 135–145)
Total Bilirubin: 0.4 mg/dL (ref 0.3–1.2)
Total Protein: 7.2 g/dL (ref 6.5–8.1)

## 2022-04-15 LAB — PROSTATE-SPECIFIC AG, SERUM (LABCORP): Prostate Specific Ag, Serum: 0.5 ng/mL (ref 0.0–4.0)

## 2022-04-16 NOTE — Progress Notes (Signed)
  Radiation Oncology         [id number]) [phone] ________________________________  Name: Wesley White MRN: [id number]  Date of Service: 04/18/2022  DOB: July 30, 1950  Post Treatment Telephone Note  Diagnosis:  Malignant neoplasm of prostate metastatic to bone [facility]) (as documented in provider EOT note)   The patient was available for call today.  The patient did note fatigue during radiation but has since improved. The patient did not note skin changes in the field of radiation during therapy. The patient has noticed improvement in pain in the area(s) treated with radiation. The patient is not taking dexamethasone. The patient does not have symptoms of  weakness or loss of control of the extremities. The patient does not have symptoms of headache. The patient does not have symptoms of seizure or uncontrolled movement. The patient does not have symptoms of changes in vision. The patient does not have changes in speech.  The patient does not have confusion.   The patient is scheduled for ongoing care with [name] in medical oncology on 04/27/22. The patient was encouraged to call if he  develop concerns or questions regarding radiation.   This concludes the nursing interview.  Leandra Kern, LPN

## 2022-04-17 ENCOUNTER — Other Ambulatory Visit: Payer: Self-pay | Admitting: Oncology

## 2022-04-17 ENCOUNTER — Encounter: Payer: Self-pay | Admitting: Oncology

## 2022-04-17 DIAGNOSIS — C61 Malignant neoplasm of prostate: Secondary | ICD-10-CM

## 2022-04-17 NOTE — Progress Notes (Signed)
                                                                                                                                                             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   Intent: Palliative  Radiation Treatment Dates: 01/25/2022 through 02/08/2022 Site Technique Total Dose (Gy) Dose per Fx (Gy) Completed Fx Beam Energies  Thoracic Spine: Spine_T8 3D 30/30 3 10/10 10X, 15X   Narrative: The patient tolerated radiation therapy relatively well with only mild esophagitis that was managed with switching to a soft foods diet.  Plan: The patient will receive a call in about one month from the radiation oncology department. He will continue follow up with [name] as well.   Nicholos Johns, PA-C    Tyler Pita, MD  [facility] Direct Dial: [phone]  Fax: [phone] .com  Skype  LinkedIn

## 2022-04-18 ENCOUNTER — Ambulatory Visit
Admission: RE | Admit: 2022-04-18 | Discharge: 2022-04-18 | Disposition: A | Discharge status: Home / Self Care | Payer: HMO | Source: Ambulatory Visit | Attending: Urology | Admitting: Urology

## 2022-04-18 ENCOUNTER — Ambulatory Visit: Payer: Self-pay | Admitting: Urology

## 2022-04-18 DIAGNOSIS — C7951 Secondary malignant neoplasm of bone: Secondary | ICD-10-CM

## 2022-04-25 ENCOUNTER — Encounter: Payer: Self-pay | Admitting: Oncology

## 2022-04-26 ENCOUNTER — Encounter: Payer: Self-pay | Admitting: Oncology

## 2022-04-26 ENCOUNTER — Other Ambulatory Visit: Payer: Self-pay | Admitting: Family Medicine

## 2022-04-26 DIAGNOSIS — E119 Type 2 diabetes mellitus without complications: Secondary | ICD-10-CM

## 2022-04-27 ENCOUNTER — Inpatient Hospital Stay: Payer: PPO | Attending: Oncology | Admitting: Oncology

## 2022-04-27 ENCOUNTER — Other Ambulatory Visit: Payer: Self-pay | Admitting: Family Medicine

## 2022-04-27 ENCOUNTER — Other Ambulatory Visit (HOSPITAL_COMMUNITY): Payer: Self-pay

## 2022-04-27 ENCOUNTER — Other Ambulatory Visit: Payer: Self-pay

## 2022-04-27 ENCOUNTER — Inpatient Hospital Stay: Payer: PPO

## 2022-04-27 VITALS — BP 120/82 | HR 101 | Temp 97.7°F | Resp 20 | Ht 69.0 in | Wt 218.2 lb

## 2022-04-27 DIAGNOSIS — M79652 Pain in left thigh: Secondary | ICD-10-CM | POA: Insufficient documentation

## 2022-04-27 DIAGNOSIS — Z79899 Other long term (current) drug therapy: Secondary | ICD-10-CM | POA: Diagnosis not present

## 2022-04-27 DIAGNOSIS — C61 Malignant neoplasm of prostate: Secondary | ICD-10-CM | POA: Insufficient documentation

## 2022-04-27 DIAGNOSIS — C7951 Secondary malignant neoplasm of bone: Secondary | ICD-10-CM | POA: Insufficient documentation

## 2022-04-27 DIAGNOSIS — Z923 Personal history of irradiation: Secondary | ICD-10-CM | POA: Diagnosis not present

## 2022-04-27 DIAGNOSIS — E119 Type 2 diabetes mellitus without complications: Secondary | ICD-10-CM

## 2022-04-27 DIAGNOSIS — M546 Pain in thoracic spine: Secondary | ICD-10-CM | POA: Diagnosis not present

## 2022-04-27 MED ORDER — DENOSUMAB 120 MG/1.7ML ~~LOC~~ SOLN
120.0000 mg | Freq: Once | SUBCUTANEOUS | Status: AC
  Administered 2022-04-27: 120 mg via SUBCUTANEOUS
  Filled 2022-04-27: qty 1.7

## 2022-04-27 NOTE — Telephone Encounter (Signed)
Oral Oncology Patient Advocate Encounter  Called patient to discuss their options for continued financial support on their Xtandi.   Patient prefers to have their prescription sent to [facility] so that they can utilize the funding from their [employer]. They will not be re-enrolling in the Xtandi PAP for 2024.  Patient's grant is $8,000 and is valid until 12/12/2022.  Lady Deutscher, CPhT-Adv Oncology Pharmacy Patient [place] Direct Number: [phone]  Fax: [phone]

## 2022-04-27 NOTE — Progress Notes (Signed)
Hematology and Oncology Follow Up   Square Jowett [id number] 1950-12-05 71 y.o. 04/27/2022 1:07 PM [name], MDLalonde, [name], MD     Principle Diagnosis: 71 year old man with castration-resistant advanced prostate cancer with disease to the bone diagnosed in 2020.  He presented with PSA  10.4 and a Gleason score 4+3 = 7 in 2017 and localized disease.    Prior Therapy:   He is status post laparoscopic radical prostatectomy and pelvic lymphadenectomy on March 05, 2016.  The final pathology showed Gleason score 4+3 equal 7 with 0 out of 9 lymph node involved.  He did have extraprostatic extension at that time.   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  He is status post radiation therapy adjuvantly under the care of Dr. Tammi Klippel completed on May 4 of 2018.  He received 68.4 Gray in 38 fractions.  Status post radiation therapy for isolated left iliac bone metastasis completed in June 2020.  Zytiga 1000 mg daily started in August 2020.  Therapy discontinued because of lack of coverage   He developed worsening back pain and a rise in his PSA up to 2.7 with MRI showed worsening disease in the spine including T12, T1 and T5.  He is status post radiation therapy to the T11 and L4 completed on February 04, 2020 after completing 30 Gray in 10 fractions.   He is status post radiation therapy to the T8 isolated spinal metastasis completed in September 2023.  He received 30 Gray in 10 fractions.   Current therapy:   Eligard 30 mg every 4 months.  This will be repeated in February 2024.   Xgeva 120 mg every 2 months.  Will receive injection today and repeated in 2 months.  Xtandi 160 mg daily started on January 20, 2020.   Interim History: Mr. Grumbine returns today for repeat follow-up.  Since last visit, he reports no major changes in his health.  He denies any recent falls or syncope.  He was able to travel to [place] and do a lot of sightseeing without any  physical limitations.  He has reported occasional left thigh pain which is not persistent at this time.  He does not take any pain medication.  He has tolerated Xtandi reasonably well.            Medications: Reviewed without changes. Current Outpatient Medications  Medication Sig Dispense Refill   acetaminophen (TYLENOL) 325 MG tablet Take 650 mg by mouth every 6 (six) hours as needed for mild pain.      canagliflozin (INVOKANA) 300 MG TABS tablet TAKE 1 TABLET BY MOUTH EVERY DAY BEFORE BREAKFAST 90 tablet 1   esomeprazole (NEXIUM) 20 MG capsule Take 20 mg by mouth daily at 12 noon.     ezetimibe (ZETIA) 10 MG tablet Take 1 tablet (10 mg total) by mouth daily. (Patient not taking: Reported on 04/06/2022) 90 tablet 3   ibuprofen (ADVIL) 600 MG tablet Take 600 mg by mouth every 6 (six) hours as needed.     lisinopril (ZESTRIL) 5 MG tablet TAKE 1 TABLET BY MOUTH EVERY DAY 90 tablet 3   metFORMIN (GLUCOPHAGE) 850 MG tablet TAKE 1 TABLET BY MOUTH TWICE A DAY WITH MEAL 180 tablet 0   naproxen sodium (ALEVE) 220 MG tablet Take 220 mg by mouth daily as needed.     OneTouch Delica Lancets 15Q MISC See admin instructions.     ONETOUCH VERIO test strip USE AS DIRECTED AS NEEDED 100 strip  11   rosuvastatin (CRESTOR) 40 MG tablet TAKE 1 TABLET BY MOUTH EVERY DAY 90 tablet 1   sucralfate (CARAFATE) 1 g tablet Take 1 tablet (1 g total) by mouth 4 (four) times daily. Dissolve each tablet in 15 cc water before use. 120 tablet 2   tadalafil (CIALIS) 20 MG tablet Take 1 tablet (20 mg total) by mouth daily as needed for erectile dysfunction. 30 tablet 1   venlafaxine XR (EFFEXOR-XR) 37.5 MG 24 hr capsule TAKE 1 CAPSULE BY MOUTH DAILY WITH BREAKFAST. (Patient not taking: Reported on 04/06/2022) 30 capsule 1   XTANDI 40 MG tablet TAKE 4 TABLETS (160 MG TOTAL) BY MOUTH DAILY. 120 tablet 2   No current facility-administered medications for this visit.   Facility-Administered Medications Ordered in Other  Visits  Medication Dose Route Frequency Provider Last Rate Last Admin   magnesium citrate solution 1 Bottle  1 Bottle Oral Once Raynelle Bring, MD       sodium phosphate (FLEET) 7-19 GM/118ML enema 1 enema  1 enema Rectal Once Raynelle Bring, MD           Physical Exam:   Blood pressure 120/82, pulse (!) 101, temperature 97.7 F (36.5 C), temperature source Temporal, resp. rate 20, height '5\' 9"'$  (1.753 m), weight 218 lb 3.2 oz (99 kg), SpO2 98 %.    ECOG: 1    General appearance: Comfortable appearing without any discomfort Head: Normocephalic without any trauma Oropharynx: Mucous membranes are moist and pink without any thrush or ulcers. Eyes: Pupils are equal and round reactive to light. Lymph nodes: No cervical, supraclavicular, inguinal or axillary lymphadenopathy.   Heart:regular rate and rhythm.  S1 and S2 without leg edema. Lung: Clear without any rhonchi or wheezes.  No dullness to percussion. Abdomin: Soft, nontender, nondistended with good bowel sounds.  No hepatosplenomegaly. Musculoskeletal: No joint deformity or effusion.  Full range of motion noted. Neurological: No deficits noted on motor, sensory and deep tendon reflex exam. Skin: No petechial rash or dryness.  Appeared moist.                     Lab Results: Lab Results  Component Value Date   WBC 5.5 04/13/2022   HGB 13.3 04/13/2022   HCT 40.1 04/13/2022   MCV 93.9 04/13/2022   PLT 285 04/13/2022   PSA 10.03 (H) 09/22/2015   PSA 10.03 (H) 09/22/2015     Chemistry      Component Value Date/Time   NA 139 04/13/2022 1331   NA 136 10/17/2018 1139   K 5.4 (H) 04/13/2022 1331   CL 103 04/13/2022 1331   CO2 28 04/13/2022 1331   BUN 18 04/13/2022 1331   BUN 11 10/17/2018 1139   CREATININE 0.57 (L) 04/13/2022 1331   CREATININE 0.81 06/08/2015 0001      Component Value Date/Time   CALCIUM 9.8 04/13/2022 1331   ALKPHOS 64 04/13/2022 1331   AST 13 (L) 04/13/2022 1331   ALT 13  04/13/2022 1331   BILITOT 0.4 04/13/2022 1331        Latest Reference Range & Units 12/11/21 13:01 02/21/22 13:12 04/13/22 13:31  Prostate Specific Ag, Serum 0.0 - 4.0 ng/mL 0.2 0.4 0.5       Impression and Plan:   71 year old man with:  1.  Castration-resistant advanced prostate cancer with disease to the bone diagnosed in 2020.    Treatment options moving forward were reviewed at this time.  His PSA continues to be under  reasonable control although very slowly rising.  His Guardant360 analysis did not show any actionable mutation I recommended continuing Xtandi at this time.  He has rapid rise in the future and symptomatic progression, I recommend repeating his PSMA PET scan and consider Taxotere chemotherapy.  Following Taxotere chemotherapy Jevtana chemotherapy as well as Pluvicto can be considered.  After discussion today, I recommended continuing Xtandi.  I recommended repeat his staging scan if he has worsening bone pain or rapid rise in his PSA.  Palliative radiation therapy could be considered to any other areas of metastasis that is problematic.      2.  T8 sclerotic lesion noted on CT scan and bone scan in June 2023.  No residual complications at this time.  He is status post radiation therapy.  3.  Androgen deprivation: This will be repeated in 2 months from now.  4.  Bone directed therapy: Complication associated with Xgeva including hypocalcemia and osteonecrosis of the jaw were reiterated.  He will receive Xgeva today and repeated in 2 months.  5.  Prognosis and goals of care: His disease is incurable although aggressive measures are warranted given his reasonable performance status.   6.  Follow-up: He will return in 2 months for a follow-up.   30  minutes were spent on this encounter.  The time was dedicated to updating disease status, treatment choices and outlining future plan of care review.   Zola Button, MD 04/27/2022 1:07 PM

## 2022-04-30 LAB — GUARDANT 360

## 2022-05-01 ENCOUNTER — Other Ambulatory Visit (HOSPITAL_COMMUNITY): Payer: Self-pay

## 2022-05-01 ENCOUNTER — Other Ambulatory Visit: Payer: Self-pay | Admitting: *Deleted

## 2022-05-01 DIAGNOSIS — C61 Malignant neoplasm of prostate: Secondary | ICD-10-CM

## 2022-05-01 MED ORDER — ENZALUTAMIDE 40 MG PO TABS
ORAL_TABLET | ORAL | 2 refills | Status: DC
  Filled 2022-05-01: qty 120, 30d supply, fill #0
  Filled 2022-05-22: qty 120, 30d supply, fill #1
  Filled 2022-06-19: qty 120, 30d supply, fill #2

## 2022-05-03 ENCOUNTER — Other Ambulatory Visit (HOSPITAL_COMMUNITY): Payer: Self-pay

## 2022-05-11 ENCOUNTER — Ambulatory Visit (INDEPENDENT_AMBULATORY_CARE_PROVIDER_SITE_OTHER): Payer: PPO | Admitting: Family Medicine

## 2022-05-11 ENCOUNTER — Encounter: Payer: Self-pay | Admitting: Family Medicine

## 2022-05-11 ENCOUNTER — Encounter: Payer: Self-pay | Admitting: Oncology

## 2022-05-11 VITALS — BP 110/70 | HR 82 | Ht 67.0 in | Wt 214.4 lb

## 2022-05-11 DIAGNOSIS — C7951 Secondary malignant neoplasm of bone: Secondary | ICD-10-CM

## 2022-05-11 DIAGNOSIS — G4733 Obstructive sleep apnea (adult) (pediatric): Secondary | ICD-10-CM

## 2022-05-11 DIAGNOSIS — C61 Malignant neoplasm of prostate: Secondary | ICD-10-CM | POA: Diagnosis not present

## 2022-05-11 DIAGNOSIS — E1169 Type 2 diabetes mellitus with other specified complication: Secondary | ICD-10-CM

## 2022-05-11 DIAGNOSIS — E669 Obesity, unspecified: Secondary | ICD-10-CM | POA: Diagnosis not present

## 2022-05-11 DIAGNOSIS — I700 Atherosclerosis of aorta: Secondary | ICD-10-CM

## 2022-05-11 DIAGNOSIS — E119 Type 2 diabetes mellitus without complications: Secondary | ICD-10-CM | POA: Diagnosis not present

## 2022-05-11 DIAGNOSIS — Z0000 Encounter for general adult medical examination without abnormal findings: Secondary | ICD-10-CM

## 2022-05-11 DIAGNOSIS — E785 Hyperlipidemia, unspecified: Secondary | ICD-10-CM

## 2022-05-11 LAB — POCT UA - MICROALBUMIN
Albumin/Creatinine Ratio, Urine, POC: 33.6
Creatinine, POC: 79.5 mg/dL
Microalbumin Ur, POC: 26.7 mg/L

## 2022-05-11 LAB — POCT GLYCOSYLATED HEMOGLOBIN (HGB A1C): Hemoglobin A1C: 7.7 % — AB (ref 4.0–5.6)

## 2022-05-11 NOTE — Patient Instructions (Signed)
Keep up the good work.  See you in 6 months.

## 2022-05-11 NOTE — Progress Notes (Signed)
Complete physical exam  Patient: Wesley White   DOB: Aug 01, 1950   71 y.o. Male  MRN: [id number]  Subjective:    Chief Complaint  Patient presents with   Annual Exam    Fasting. No additional problems. Had AWV with [facility] on 04/06/2022.    Wesley White is a 71 y.o. male who presents today for a complete physical exam. He reports consuming a general diet. Home exercise routine includes walking 3-4 times a week. He generally feels well. He reports sleeping fairly well. He does not have additional problems to discuss today.  He continues to be followed by urology for his underlying metastatic prostate cancer and is now using Xtandi.  He did have a metastatic thoracic lesion that was radiated and he is now having no difficulty with that.  The only complaint he has is fatigue.  He is not taking Effexor as he states it made him have brain fog.  He continues on Invokana and Glucophage.  He is also taking Crestor.  Does have x-ray evidence of aortic atherosclerosis.  His CPAP is working quite well.  He is getting 10 to 12 hours of sleep.  Otherwise he has no concerns or complaints.   Most recent fall risk assessment:    04/06/2022   10:06 AM  [place] in the past year? 1  Comment fell in [place]  Number falls in past yr: 0  Injury with Fall? 0  Risk for fall due to : Medication side effect  Follow up Falls evaluation completed;Education provided;Falls prevention discussed     Most recent depression screenings:    04/06/2022   10:07 AM 03/28/2021    1:38 PM  PHQ 2/9 Scores  PHQ - 2 Score 0 0  PHQ- 9 Score 2    .lastpsa  Vision:Within last year, Dental: No current dental problems, and PSA:     Patient Care Team: [name], MD as PCP - General Cira Rue, RN Nurse Navigator as Registered Nurse (Medical Oncology)   Outpatient Medications Prior to Visit  Medication Sig   acetaminophen (TYLENOL) 325 MG tablet Take 650 mg by mouth every 6 (six) hours as needed for mild  pain.    canagliflozin (INVOKANA) 300 MG TABS tablet TAKE 1 TABLET BY MOUTH EVERY DAY BEFORE BREAKFAST   enzalutamide (XTANDI) 40 MG tablet TAKE 4 TABLETS (160 MG TOTAL) BY MOUTH DAILY.   esomeprazole (NEXIUM) 20 MG capsule Take 20 mg by mouth daily at 12 noon.   ibuprofen (ADVIL) 600 MG tablet Take 600 mg by mouth every 6 (six) hours as needed.   lisinopril (ZESTRIL) 5 MG tablet TAKE 1 TABLET BY MOUTH EVERY DAY   metFORMIN (GLUCOPHAGE) 850 MG tablet TAKE 1 TABLET BY MOUTH TWICE A DAY WITH MEAL   naproxen sodium (ALEVE) 220 MG tablet Take 220 mg by mouth daily as needed.   OneTouch Delica Lancets 87F MISC See admin instructions.   ONETOUCH VERIO test strip USE AS DIRECTED AS NEEDED   rosuvastatin (CRESTOR) 40 MG tablet TAKE 1 TABLET BY MOUTH EVERY DAY   ezetimibe (ZETIA) 10 MG tablet Take 1 tablet (10 mg total) by mouth daily. (Patient not taking: Reported on 04/06/2022)   sucralfate (CARAFATE) 1 g tablet Take 1 tablet (1 g total) by mouth 4 (four) times daily. Dissolve each tablet in 15 cc water before use. (Patient not taking: Reported on 05/11/2022)   tadalafil (CIALIS) 20 MG tablet Take 1 tablet (20 mg total) by mouth  daily as needed for erectile dysfunction. (Patient not taking: Reported on 05/11/2022)   venlafaxine XR (EFFEXOR-XR) 37.5 MG 24 hr capsule TAKE 1 CAPSULE BY MOUTH DAILY WITH BREAKFAST. (Patient not taking: Reported on 04/06/2022)   Facility-Administered Medications Prior to Visit  Medication Dose Route Frequency Provider   magnesium citrate solution 1 Bottle  1 Bottle Oral Once Raynelle Bring, MD   sodium phosphate (FLEET) 7-19 GM/118ML enema 1 enema  1 enema Rectal Once Raynelle Bring, MD    Review of Systems  All other systems reviewed and are negative.         Objective:  Alert and in no distress. Tympanic membranes and canals are normal. Pharyngeal area is normal. Neck is supple without adenopathy or thyromegaly. Cardiac exam shows a regular sinus rhythm without  murmurs or gallops. Lungs are clear to auscultation. Hemoglobin A1c is 7.7   BP 110/70   Pulse 82   Ht _0  (1.702 m)   Wt 214 lb 6.4 oz (97.3 kg)   SpO2 97% Comment: room air  BMI 33.58 kg/m    Physical Exam       Assessment & Plan:   Routine general medical examination at a health care facility  OSA on CPAP  Prostate cancer metastatic to bone ([place])  Controlled type 2 diabetes mellitus without complication, without long-term current use of insulin ([place]) - Plan: POCT UA - Microalbumin, POCT glycosylated hemoglobin (Hb A1C)  Obesity (BMI 30.0-34.9)  Hyperlipidemia associated with type 2 diabetes mellitus ([place]) - Plan: Lipid panel  Aortic atherosclerosis ([place])  Immunization History  Administered Date(s) Administered   Fluad Quad(high Dose 65+) 02/05/2019, 03/28/2021, 02/19/2022   Influenza, High Dose Seasonal PF 04/25/2017, 06/20/2018   Influenza,inj,Quad PF,6+ Mos 03/06/2016   PFIZER Comirnaty(Gray Top)Covid-19 Tri-Sucrose Vaccine 09/07/2020   PFIZER(Purple Top)SARS-COV-2 Vaccination 07/04/2019, 07/29/2019, 09/07/2020   Pfizer Covid-19 Vaccine Bivalent Booster 74yr & up 05/31/2021   Pneumococcal Conjugate-13 02/05/2019   Pneumococcal Polysaccharide-23 03/06/2016, 04/25/2017   Tdap 03/17/2012   Unspecified SARS-COV-2 Vaccination 02/28/2022   Zoster, Live 03/17/2012    Health Maintenance  Topic Date Due   Zoster Vaccines- Shingrix (1 of 2) Never done   Diabetic kidney evaluation - Urine ACR  09/15/2021   DTaP/Tdap/Td (2 - Td or Tdap) 03/17/2022   COLONOSCOPY (Pts 45-437yrInsurance coverage will need to be confirmed)  04/17/2022   COVID-19 Vaccine (7 - 2023-24 season) 04/25/2022   HEMOGLOBIN A1C  04/28/2022   FOOT EXAM  07/13/2022   OPHTHALMOLOGY EXAM  10/28/2022   Medicare Annual Wellness (AWV)  04/07/2023   Diabetic kidney evaluation - eGFR measurement  04/14/2023   Pneumonia Vaccine [age]Years old  Completed   INFLUENZA VACCINE  Completed   Hepatitis C  Screening  Completed   HPV VACCINES  Aged Out     Problem List Items Addressed This Visit     Aortic atherosclerosis ([facility]  Controlled type 2 diabetes mellitus without complication, without long-term current use of insulin ([place]  Hyperlipidemia associated with type 2 diabetes mellitus ([place]  Obesity (BMI 30.0-34.9)   OSA on CPAP   Other Visit Diagnoses     Routine general medical examination at a health care facility    -  Primary   Encounter for Medicare annual wellness exam       Prostate cancer metastatic to bone ([facility]        He is doing well on his present medication regimen and I see no reason to change it.  He is comfortable with this. Follow-up 21-month   JJill Alexanders MD

## 2022-05-12 LAB — LIPID PANEL
Chol/HDL Ratio: 3 ratio (ref 0.0–5.0)
Cholesterol, Total: 152 mg/dL (ref 100–199)
HDL: 51 mg/dL (ref >39)
LDL Chol Calc (NIH): 78 mg/dL (ref 0–99)
Triglycerides: 130 mg/dL (ref 0–149)
VLDL Cholesterol Cal: 23 mg/dL (ref 5–40)

## 2022-05-14 ENCOUNTER — Telehealth: Payer: Self-pay | Admitting: *Deleted

## 2022-05-14 NOTE — Telephone Encounter (Signed)
Stephanie says he would like to see Dr Benay Spice at [facility] when Dr Alen Blew moves. He was VERY appreciative of everything Dr Alen Blew has done for him!

## 2022-05-22 ENCOUNTER — Other Ambulatory Visit (HOSPITAL_COMMUNITY): Payer: Self-pay

## 2022-05-23 ENCOUNTER — Other Ambulatory Visit: Payer: Self-pay

## 2022-05-23 ENCOUNTER — Other Ambulatory Visit (HOSPITAL_COMMUNITY): Payer: Self-pay

## 2022-05-24 ENCOUNTER — Other Ambulatory Visit: Payer: Self-pay

## 2022-06-19 ENCOUNTER — Other Ambulatory Visit (HOSPITAL_COMMUNITY): Payer: Self-pay

## 2022-06-21 DIAGNOSIS — H53143 Visual discomfort, bilateral: Secondary | ICD-10-CM | POA: Diagnosis not present

## 2022-06-21 DIAGNOSIS — H52221 Regular astigmatism, right eye: Secondary | ICD-10-CM | POA: Diagnosis not present

## 2022-06-21 DIAGNOSIS — Q149 Congenital malformation of posterior segment of eye, unspecified: Secondary | ICD-10-CM | POA: Diagnosis not present

## 2022-06-21 DIAGNOSIS — H524 Presbyopia: Secondary | ICD-10-CM | POA: Diagnosis not present

## 2022-06-21 DIAGNOSIS — H2513 Age-related nuclear cataract, bilateral: Secondary | ICD-10-CM | POA: Diagnosis not present

## 2022-06-21 DIAGNOSIS — H5203 Hypermetropia, bilateral: Secondary | ICD-10-CM | POA: Diagnosis not present

## 2022-06-21 DIAGNOSIS — E119 Type 2 diabetes mellitus without complications: Secondary | ICD-10-CM | POA: Diagnosis not present

## 2022-06-21 DIAGNOSIS — H25813 Combined forms of age-related cataract, bilateral: Secondary | ICD-10-CM | POA: Diagnosis not present

## 2022-06-21 DIAGNOSIS — H11152 Pinguecula, left eye: Secondary | ICD-10-CM | POA: Diagnosis not present

## 2022-06-21 LAB — HM DIABETES EYE EXAM

## 2022-06-22 ENCOUNTER — Other Ambulatory Visit: Payer: Self-pay | Admitting: *Deleted

## 2022-06-22 DIAGNOSIS — C7951 Secondary malignant neoplasm of bone: Secondary | ICD-10-CM

## 2022-06-26 DIAGNOSIS — G4733 Obstructive sleep apnea (adult) (pediatric): Secondary | ICD-10-CM | POA: Diagnosis not present

## 2022-06-27 ENCOUNTER — Other Ambulatory Visit (HOSPITAL_COMMUNITY): Payer: Self-pay

## 2022-06-28 ENCOUNTER — Inpatient Hospital Stay: Payer: PPO | Attending: Oncology

## 2022-06-28 ENCOUNTER — Inpatient Hospital Stay: Payer: PPO

## 2022-06-28 ENCOUNTER — Inpatient Hospital Stay (HOSPITAL_BASED_OUTPATIENT_CLINIC_OR_DEPARTMENT_OTHER): Payer: PPO | Admitting: Oncology

## 2022-06-28 ENCOUNTER — Other Ambulatory Visit: Payer: Self-pay

## 2022-06-28 ENCOUNTER — Encounter: Payer: Self-pay | Admitting: Oncology

## 2022-06-28 VITALS — BP 110/66 | HR 96 | Temp 98.1°F | Resp 18 | Ht 67.0 in | Wt 211.8 lb

## 2022-06-28 DIAGNOSIS — Z9079 Acquired absence of other genital organ(s): Secondary | ICD-10-CM | POA: Diagnosis not present

## 2022-06-28 DIAGNOSIS — M545 Low back pain, unspecified: Secondary | ICD-10-CM | POA: Diagnosis not present

## 2022-06-28 DIAGNOSIS — R5383 Other fatigue: Secondary | ICD-10-CM | POA: Diagnosis not present

## 2022-06-28 DIAGNOSIS — C7951 Secondary malignant neoplasm of bone: Secondary | ICD-10-CM

## 2022-06-28 DIAGNOSIS — C61 Malignant neoplasm of prostate: Secondary | ICD-10-CM | POA: Diagnosis not present

## 2022-06-28 DIAGNOSIS — Z79899 Other long term (current) drug therapy: Secondary | ICD-10-CM | POA: Insufficient documentation

## 2022-06-28 DIAGNOSIS — Z5111 Encounter for antineoplastic chemotherapy: Secondary | ICD-10-CM | POA: Insufficient documentation

## 2022-06-28 DIAGNOSIS — E119 Type 2 diabetes mellitus without complications: Secondary | ICD-10-CM | POA: Diagnosis not present

## 2022-06-28 DIAGNOSIS — Z923 Personal history of irradiation: Secondary | ICD-10-CM | POA: Diagnosis not present

## 2022-06-28 DIAGNOSIS — R232 Flushing: Secondary | ICD-10-CM | POA: Diagnosis not present

## 2022-06-28 LAB — CMP (CANCER CENTER ONLY)
ALT: 14 U/L (ref 0–44)
AST: 14 U/L — ABNORMAL LOW (ref 15–41)
Albumin: 4.6 g/dL (ref 3.5–5.0)
Alkaline Phosphatase: 56 U/L (ref 38–126)
Anion gap: 10 (ref 5–15)
BUN: 18 mg/dL (ref 8–23)
CO2: 28 mmol/L (ref 22–32)
Calcium: 10.3 mg/dL (ref 8.9–10.3)
Chloride: 99 mmol/L (ref 98–111)
Creatinine: 0.7 mg/dL (ref 0.61–1.24)
GFR, Estimated: >60 mL/min (ref >60)
Glucose, Bld: 169 mg/dL — ABNORMAL HIGH (ref 70–99)
Potassium: 4.8 mmol/L (ref 3.5–5.1)
Sodium: 137 mmol/L (ref 135–145)
Total Bilirubin: 0.4 mg/dL (ref 0.3–1.2)
Total Protein: 7.3 g/dL (ref 6.5–8.1)

## 2022-06-28 LAB — CBC WITH DIFFERENTIAL (CANCER CENTER ONLY)
Abs Immature Granulocytes: 0.04 10*3/uL (ref 0.00–0.07)
Basophils Absolute: 0 10*3/uL (ref 0.0–0.1)
Basophils Relative: 1 %
Eosinophils Absolute: 0.1 10*3/uL (ref 0.0–0.5)
Eosinophils Relative: 1 %
HCT: 44.6 % (ref 39.0–52.0)
Hemoglobin: 14.9 g/dL (ref 13.0–17.0)
Immature Granulocytes: 1 %
Lymphocytes Relative: 16 %
Lymphs Abs: 0.8 10*3/uL (ref 0.7–4.0)
MCH: 30.3 pg (ref 26.0–34.0)
MCHC: 33.4 g/dL (ref 30.0–36.0)
MCV: 90.8 fL (ref 80.0–100.0)
Monocytes Absolute: 0.5 10*3/uL (ref 0.1–1.0)
Monocytes Relative: 10 %
Neutro Abs: 3.5 10*3/uL (ref 1.7–7.7)
Neutrophils Relative %: 71 %
Platelet Count: 258 10*3/uL (ref 150–400)
RBC: 4.91 MIL/uL (ref 4.22–5.81)
RDW: 13.8 % (ref 11.5–15.5)
WBC Count: 4.9 10*3/uL (ref 4.0–10.5)
nRBC: 0 % (ref 0.0–0.2)

## 2022-06-28 MED ORDER — DENOSUMAB 120 MG/1.7ML ~~LOC~~ SOLN
120.0000 mg | Freq: Once | SUBCUTANEOUS | Status: AC
  Administered 2022-06-28: 120 mg via SUBCUTANEOUS
  Filled 2022-06-28: qty 1.7

## 2022-06-28 MED ORDER — LEUPROLIDE ACETATE (4 MONTH) 30 MG ~~LOC~~ KIT
30.0000 mg | PACK | Freq: Once | SUBCUTANEOUS | Status: AC
  Administered 2022-06-28: 30 mg via SUBCUTANEOUS
  Filled 2022-06-28: qty 30

## 2022-06-28 NOTE — Progress Notes (Signed)
[place] OFFICE PROGRESS NOTE   Diagnosis: Prostate cancer  INTERVAL HISTORY:   Wesley White has been followed by [name] since being diagnosed with prostate cancer in 2017.  He underwent a prostatectomy for a Gleason 4+3 lesion with a detectable PSA.  He completed radiation to the prostatic fossa 08/08/2016 - 09/28/2017, 68.4 Gray in 38 fractions.  He developed a recurrence to the left iliac bone and completed radiation in June 2020.  He began [place] in August 2020 and this was discontinued secondary to lack of insurance coverage.  He began Lupron in August 2020 and is currently maintained on Lupron every 4 months.  He has completed radiation to multiple bone lesions including T11 and L4 in September 2021 and T8 in September 2023.  He began [place] on 01/20/2020.  He continues Xtandi.  He is currently maintained on Xgeva every 2 months.  He is referred to the [facility] for continuation of oncology care.  He is due for [facility] and leuprolide today.  Past medical history: Diabetes  Past surgical history: Prostatectomy 03/05/2016, Gleason 4+3 equal 7, extraprostatic extension lymphovascular and perineural invasion identified,pT3b,pN0 2.  Inguinal hernia repair in 1983  Family history: His father had lymphoma, a brother had prostate cancer  Social history: He lives in [place] with his wife.  He is retired from [employer] as a Engineer, technical sales, he does not use cigarettes.  Rare alcohol use.  No transfusion history.  No risk factor for HIV or hepatitis.  Systems: Positives-right lower back pain, fatigue, hot flashes-now less frequent weight loss-he relates this to a change in his diet A complete review of systems was otherwise negative    Objective:  Vital signs in last 24 hours:  Blood pressure 110/66, pulse 96, temperature 98.1 F (36.7 C), temperature source Oral, resp. rate 18, height '5\' 7"'$  (1.702 m), weight 211 lb 12.8 oz (96.1 kg), SpO2 99 %.     Lymphatics: No cervical, supraclavicular, axillary, or inguinal nodes Resp: Lungs clear bilaterally Cardio: Regular rate and rhythm GI: No hepatosplenomegaly Vascular: No leg edema Neuro: The motor exam appears intact in the upper and lower extremities bilaterally Skin: No rash  Lab Results:  Lab Results  Component Value Date   WBC 4.9 06/28/2022   HGB 14.9 06/28/2022   HCT 44.6 06/28/2022   MCV 90.8 06/28/2022   PLT 258 06/28/2022   NEUTROABS 3.5 06/28/2022    CMP  Lab Results  Component Value Date   NA 137 06/28/2022   K 4.8 06/28/2022   CL 99 06/28/2022   CO2 28 06/28/2022   GLUCOSE 169 (H) 06/28/2022   BUN 18 06/28/2022   CREATININE 0.70 06/28/2022   CALCIUM 10.3 06/28/2022   PROT 7.3 06/28/2022   ALBUMIN 4.6 06/28/2022   AST 14 (L) 06/28/2022   ALT 14 06/28/2022   ALKPHOS 56 06/28/2022   BILITOT 0.4 06/28/2022   GFRNONAA >60 06/28/2022   GFRAA >60 12/31/2019   Guardant360 04/14/2022-AR T878A alteration, BRCA2 VUS, MSI high not detected  Imaging:  No results found.  Medications: I have reviewed the patient's current medications.   Assessment/Plan: 1.  Prostate cancer Prostatectomy and pelvic lymphadenectomy 03/05/2016-Gleason 4+3 equal 7, 0/9 nodes, extraprostatic extension,pT3b,pN0 Radiation to the prostatic bed completed 09/28/2016, 68.4 Gray in 38 fractions Recurrent disease June 2020, status post radiation to the isolated left iliac metastasis Lupron beginning August 2020  Zytiga August 2020-discontinued due to lack of insurance coverage Rising PSA with MRI confirming progressive disease at  multiple vertebrae Radiation to T11 and L4 completed 02/04/2020 30 Gray in 10 fractions Xtandi starting 01/20/2020 CTs 10/18/2021-new T8 metastasis and other stable bone metastases, no evidence of extraosseous metastases Bone scan 10/18/2021-new T8 metastasis, stable uptake in the left iliac bone Radiation to T8 metastasis September 2023, 30 Gray in 10  fractions Guardant360 04/14/2022- AR T878A, BRCA2 VUS MSI high not detected  2.  Diabetes   Disposition: Wesley White has metastatic prostate cancer, currently being treated with every 15-monthleuprolide and Xtandi.  He has been maintained on Xtandi since August 2021.  The PSA has been slightly higher and within the normal range over the past 6 months.  We will follow-up on the PSA from today.  He has been treated to multiple sites of bone metastases over the past few years. His overall clinical status appears stable.  He last underwent surveillance imaging in May 2023.  He will continue every 224-monthgDelton Seeevery 4-21-monthuprolide, and daily Xtandi for now.  He will return for a PSA and office visit in 2 months.  We will plan for a PSMA PET if he develops a significant rise in the PSA.  We discussed general systemic therapy options including Pluvicto and docetaxel.  He has discussed cabazitaxel with Dr. ShaAlen BlewGarBetsy CoderD  06/28/2022  2:44 PM

## 2022-06-28 NOTE — Patient Instructions (Signed)
Denosumab Injection (Oncology) What is this medication? DENOSUMAB (den oh SUE mab) prevents weakened bones caused by cancer. It may also be used to treat noncancerous bone tumors that cannot be removed by surgery. It can also be used to treat high calcium levels in the blood caused by cancer. It works by blocking a protein that causes bones to break down quickly. This slows down the release of calcium from bones, which lowers calcium levels in your blood. It also makes your bones stronger and less likely to break (fracture). This medicine may be used for other purposes; ask your health care provider or pharmacist if you have questions. COMMON BRAND NAME(S): XGEVA What should I tell my care team before I take this medication? They need to know if you have any of these conditions: Dental disease Having surgery or tooth extraction Infection Kidney disease Low levels of calcium or vitamin D in the blood Malnutrition On hemodialysis Skin conditions or sensitivity Thyroid or parathyroid disease An unusual reaction to denosumab, other medications, foods, dyes, or preservatives Pregnant or trying to get pregnant Breast-feeding How should I use this medication? This medication is for injection under the skin. It is given by your care team in a hospital or clinic setting. A special MedGuide will be given to you before each treatment. Be sure to read this information carefully each time. Talk to your care team about the use of this medication in children. While it may be prescribed for children as young as 13 years for selected conditions, precautions do apply. Overdosage: If you think you have taken too much of this medicine contact a poison control center or emergency room at once. NOTE: This medicine is only for you. Do not share this medicine with others. What if I miss a dose? Keep appointments for follow-up doses. It is important not to miss your dose. Call your care team if you are unable to  keep an appointment. What may interact with this medication? Do not take this medication with any of the following: Other medications containing denosumab This medication may also interact with the following: Medications that lower your chance of fighting infection Steroid medications, such as prednisone or cortisone This list may not describe all possible interactions. Give your health care provider a list of all the medicines, herbs, non-prescription drugs, or dietary supplements you use. Also tell them if you smoke, drink alcohol, or use illegal drugs. Some items may interact with your medicine. What should I watch for while using this medication? Your condition will be monitored carefully while you are receiving this medication. You may need blood work while taking this medication. This medication may increase your risk of getting an infection. Call your care team for advice if you get a fever, chills, sore throat, or other symptoms of a cold or flu. Do not treat yourself. Try to avoid being around people who are sick. You should make sure you get enough calcium and vitamin D while you are taking this medication, unless your care team tells you not to. Discuss the foods you eat and the vitamins you take with your care team. Some people who take this medication have severe bone, joint, or muscle pain. This medication may also increase your risk for jaw problems or a broken thigh bone. Tell your care team right away if you have severe pain in your jaw, bones, joints, or muscles. Tell your care team if you have any pain that does not go away or that gets worse. Talk  to your care team if you may be pregnant. Serious birth defects can occur if you take this medication during pregnancy and for 5 months after the last dose. You will need a negative pregnancy test before starting this medication. Contraception is recommended while taking this medication and for 5 months after the last dose. Your care team  can help you find the option that works for you. What side effects may I notice from receiving this medication? Side effects that you should report to your care team as soon as possible: Allergic reactions--skin rash, itching, hives, swelling of the face, lips, tongue, or throat Bone, joint, or muscle pain Low calcium level--muscle pain or cramps, confusion, tingling, or numbness in the hands or feet Osteonecrosis of the jaw--pain, swelling, or redness in the mouth, numbness of the jaw, poor healing after dental work, unusual discharge from the mouth, visible bones in the mouth Side effects that usually do not require medical attention (report to your care team if they continue or are bothersome): Cough Diarrhea Fatigue Headache Nausea This list may not describe all possible side effects. Call your doctor for medical advice about side effects. You may report side effects to FDA at 1-800-FDA-1088. Where should I keep my medication? This medication is given in a hospital or clinic. It will not be stored at home. NOTE: This sheet is a summary. It may not cover all possible information. If you have questions about this medicine, talk to your doctor, pharmacist, or health care provider.  2023 Elsevier/Gold Standard (2021-10-02 00:00:00)  Leuprolide Suspension for Injection (Prostate Cancer) What is this medication? LEUPROLIDE (loo PROE lide) reduces the symptoms of prostate cancer. It works by decreasing levels of the hormone testosterone in the body. This prevents prostate cancer cells from spreading or growing. This medicine may be used for other purposes; ask your health care provider or pharmacist if you have questions. COMMON BRAND NAME(S): Eligard, Lupron Depot, Lupron Depot-Ped, Lutrate Depot, Viadur What should I tell my care team before I take this medication? They need to know if you have any of these conditions: Diabetes Heart disease Heart failure High or low levels of  electrolytes, such as magnesium, potassium, or sodium in your blood Irregular heartbeat or rhythm Seizures An unusual or allergic reaction to leuprolide, other medications, foods, dyes, or preservatives Pregnant or trying to get pregnant Breast-feeding How should I use this medication? This medication is injected under the skin or into a muscle. It is given by your care team in a hospital or clinic setting. Talk to your care team about the use of this medication in children. Special care may be needed. Overdosage: If you think you have taken too much of this medicine contact a poison control center or emergency room at once. NOTE: This medicine is only for you. Do not share this medicine with others. What if I miss a dose? Keep appointments for follow-up doses. It is important not to miss your dose. Call your care team if you are unable to keep an appointment. What may interact with this medication? Do not take this medication with any of the following: Cisapride Dronedarone Ketoconazole Levoketoconazole Pimozide Thioridazine This medication may also interact with the following: Other medications that cause heart rhythm changes This list may not describe all possible interactions. Give your health care provider a list of all the medicines, herbs, non-prescription drugs, or dietary supplements you use. Also tell them if you smoke, drink alcohol, or use illegal drugs. Some items may interact  with your medicine. What should I watch for while using this medication? Visit your care team for regular checks on your progress. Tell your care team if your symptoms do not start to get better or if they get worse. This medication may increase blood sugar. The risk may be higher in patients who already have diabetes. Ask your care team what you can do to lower the risk of diabetes while taking this medication. This medication may cause infertility. Talk to your care team if you are concerned about your  fertility. Heart attacks and strokes have been reported with the use of this medication. Get emergency help if you develop signs or symptoms of a heart attack or stroke. Talk to your care team about the risks and benefits of this medication. What side effects may I notice from receiving this medication? Side effects that you should report to your care team as soon as possible: Allergic reactions--skin rash, itching, hives, swelling of the face, lips, tongue, or throat Heart attack--pain or tightness in the chest, shoulders, arms, or jaw, nausea, shortness of breath, cold or clammy skin, feeling faint or lightheaded Heart rhythm changes--fast or irregular heartbeat, dizziness, feeling faint or lightheaded, chest pain, trouble breathing High blood sugar (hyperglycemia)--increased thirst or amount of urine, unusual weakness or fatigue, blurry vision Mood swings, irritability, hostility Seizures Stroke--sudden numbness or weakness of the face, arm, or leg, trouble speaking, confusion, trouble walking, loss of balance or coordination, dizziness, severe headache, change in vision Thoughts of suicide or self-harm, worsening mood, feelings of depression Side effects that usually do not require medical attention (report to your care team if they continue or are bothersome): Bone pain Change in sex drive or performance General discomfort and fatigue Hot flashes Muscle pain Pain, redness, or irritation at injection site Swelling of the ankles, hands, or feet This list may not describe all possible side effects. Call your doctor for medical advice about side effects. You may report side effects to FDA at 1-800-FDA-1088. Where should I keep my medication? This medication is given in a hospital or clinic. It will not be stored at home. NOTE: This sheet is a summary. It may not cover all possible information. If you have questions about this medicine, talk to your doctor, pharmacist, or health care  provider.  2023 Elsevier/Gold Standard (2021-07-26 00:00:00)

## 2022-06-30 LAB — PROSTATE-SPECIFIC AG, SERUM (LABCORP): Prostate Specific Ag, Serum: 0.9 ng/mL (ref 0.0–4.0)

## 2022-07-02 ENCOUNTER — Telehealth: Payer: Self-pay

## 2022-07-02 ENCOUNTER — Other Ambulatory Visit: Payer: Self-pay | Admitting: Oncology

## 2022-07-02 ENCOUNTER — Encounter: Payer: Self-pay | Admitting: Oncology

## 2022-07-02 NOTE — Telephone Encounter (Signed)
-----   Message from Ladell Pier, MD sent at 07/01/2022  6:50 AM EST ----- Please call patient, PSA is stable, f/u as scheduled

## 2022-07-02 NOTE — Telephone Encounter (Signed)
Patient gave verbal understanding and had no further questions or concerns at this time 

## 2022-07-05 ENCOUNTER — Inpatient Hospital Stay: Payer: PPO

## 2022-07-05 NOTE — Progress Notes (Signed)
Wesley White  Initial Assessment   Wesley White is a 72 y.o. year old male contacted by phone. Clinical Social White was referred by new patient protocol for assessment of psychosocial needs.   SDOH (Social Determinants of Health) assessments performed: Yes SDOH Interventions    Flowsheet Row Office Visit from 06/28/2022 in [facility] from 04/06/2022 in [facility] Interventions Intervention Not Indicated Intervention Not Indicated  Housing Interventions Intervention Not Indicated --  Transportation Interventions Intervention Not Indicated Intervention Not Indicated  Utilities Interventions Intervention Not Indicated --  Depression Interventions/Treatment  -- DJM4-2 Score <4 Follow-up Not Indicated  Financial Strain Interventions -- Intervention Not Indicated  Physical Activity Interventions -- Intervention Not Indicated  Stress Interventions -- Intervention Not Indicated       [place]: No Food Insecurity (06/28/2022)  Housing: Low Risk  (06/28/2022)  Transportation Needs: No Transportation Needs (06/28/2022)  Utilities: Not At Risk (06/28/2022)  Depression (PHQ2-9): Low Risk  (06/28/2022)  Financial Resource Strain: Low Risk  (04/06/2022)  Physical Activity: Sufficiently Active (04/06/2022)  Stress: No Stress Concern Present (04/06/2022)  Tobacco Use: Low Risk  (05/11/2022)     Distress Screen completed: No    01/19/2022    8:41 AM  ONCBCN DISTRESS SCREENING  Distress experienced in past week (1-10) 4  Emotional problem type Nervousness/Anxiety;Adjusting to illness      Family/Social Information:  Housing Arrangement: patient lives with his wife, Wesley White. Family members/support persons in your life? Family and Friends.  His daughter lives in [place] and is also supportive.  He has been married 64 years.  Wesley White and Wesley White have been  traveling the world.  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 Transportation concerns: no  Employment: Retired   Income source: Paediatric nurse concerns: No Type of concern: None Food access concerns: no Religious or spiritual practice: Not known Services Currently in place:  [employer]  Coping/ Adjustment to diagnosis: Patient understands treatment plan and what happens next? yes Concerns about diagnosis and/or treatment: I'm not especially worried about anything Patient reported stressors:  Patient denied stressors. Hopes and/or priorities: Having the best quality of life possible. Patient enjoys time with family/ friends Current coping skills/ strengths: Ability for insight , Average or above average intelligence , Capable of independent living , Communication skills , Financial means , General fund of knowledge , and Supportive family/friends     SUMMARY: Current SDOH Barriers:  None per patient.  Clinical Social White Clinical Goal(s):  No clinical social White goals at this time  Interventions: Discussed common feeling and emotions when being diagnosed with cancer, and the importance of support during treatment Informed patient of the support team roles and support services at [facility] Provided [place] contact information and encouraged patient to call with any questions or concerns Provided patient with information about the [employer]  He agreed to allow CSW to mail him program information and contact information.   Follow Up Plan: Patient will contact CSW with any support or resource needs Patient verbalizes understanding of plan: Yes    Rodman Pickle Mady Oubre, LCSW

## 2022-07-20 ENCOUNTER — Other Ambulatory Visit: Payer: Self-pay | Admitting: Oncology

## 2022-07-20 ENCOUNTER — Other Ambulatory Visit (HOSPITAL_COMMUNITY): Payer: Self-pay

## 2022-07-20 DIAGNOSIS — C7951 Secondary malignant neoplasm of bone: Secondary | ICD-10-CM

## 2022-07-20 MED ORDER — ENZALUTAMIDE 40 MG PO TABS
ORAL_TABLET | ORAL | 2 refills | Status: DC
  Filled 2022-07-20: qty 120, 30d supply, fill #0
  Filled 2022-08-23: qty 120, 30d supply, fill #1
  Filled 2022-09-18: qty 120, 30d supply, fill #2

## 2022-07-25 DIAGNOSIS — L814 Other melanin hyperpigmentation: Secondary | ICD-10-CM | POA: Diagnosis not present

## 2022-07-25 DIAGNOSIS — L821 Other seborrheic keratosis: Secondary | ICD-10-CM | POA: Diagnosis not present

## 2022-07-25 DIAGNOSIS — D225 Melanocytic nevi of trunk: Secondary | ICD-10-CM | POA: Diagnosis not present

## 2022-07-29 ENCOUNTER — Other Ambulatory Visit: Payer: Self-pay | Admitting: Family Medicine

## 2022-07-29 DIAGNOSIS — E119 Type 2 diabetes mellitus without complications: Secondary | ICD-10-CM

## 2022-07-31 ENCOUNTER — Other Ambulatory Visit (HOSPITAL_COMMUNITY): Payer: Self-pay

## 2022-08-21 ENCOUNTER — Inpatient Hospital Stay: Payer: PPO | Attending: Oncology

## 2022-08-21 DIAGNOSIS — C7951 Secondary malignant neoplasm of bone: Secondary | ICD-10-CM | POA: Insufficient documentation

## 2022-08-21 DIAGNOSIS — C61 Malignant neoplasm of prostate: Secondary | ICD-10-CM | POA: Diagnosis not present

## 2022-08-21 DIAGNOSIS — E119 Type 2 diabetes mellitus without complications: Secondary | ICD-10-CM | POA: Diagnosis not present

## 2022-08-21 DIAGNOSIS — Z79899 Other long term (current) drug therapy: Secondary | ICD-10-CM | POA: Diagnosis not present

## 2022-08-21 LAB — CMP (CANCER CENTER ONLY)
ALT: 14 U/L (ref 0–44)
AST: 14 U/L — ABNORMAL LOW (ref 15–41)
Albumin: 4.5 g/dL (ref 3.5–5.0)
Alkaline Phosphatase: 65 U/L (ref 38–126)
Anion gap: 9 (ref 5–15)
BUN: 14 mg/dL (ref 8–23)
CO2: 25 mmol/L (ref 22–32)
Calcium: 9.9 mg/dL (ref 8.9–10.3)
Chloride: 101 mmol/L (ref 98–111)
Creatinine: 0.65 mg/dL (ref 0.61–1.24)
GFR, Estimated: >60 mL/min (ref >60)
Glucose, Bld: 150 mg/dL — ABNORMAL HIGH (ref 70–99)
Potassium: 4.4 mmol/L (ref 3.5–5.1)
Sodium: 135 mmol/L (ref 135–145)
Total Bilirubin: 0.6 mg/dL (ref 0.3–1.2)
Total Protein: 7 g/dL (ref 6.5–8.1)

## 2022-08-21 LAB — CBC WITH DIFFERENTIAL (CANCER CENTER ONLY)
Abs Immature Granulocytes: 0.03 10*3/uL (ref 0.00–0.07)
Basophils Absolute: 0 10*3/uL (ref 0.0–0.1)
Basophils Relative: 1 %
Eosinophils Absolute: 0 10*3/uL (ref 0.0–0.5)
Eosinophils Relative: 1 %
HCT: 42.5 % (ref 39.0–52.0)
Hemoglobin: 14.5 g/dL (ref 13.0–17.0)
Immature Granulocytes: 1 %
Lymphocytes Relative: 18 %
Lymphs Abs: 0.7 10*3/uL (ref 0.7–4.0)
MCH: 30.7 pg (ref 26.0–34.0)
MCHC: 34.1 g/dL (ref 30.0–36.0)
MCV: 90 fL (ref 80.0–100.0)
Monocytes Absolute: 0.4 10*3/uL (ref 0.1–1.0)
Monocytes Relative: 10 %
Neutro Abs: 2.8 10*3/uL (ref 1.7–7.7)
Neutrophils Relative %: 69 %
Platelet Count: 270 10*3/uL (ref 150–400)
RBC: 4.72 MIL/uL (ref 4.22–5.81)
RDW: 14.2 % (ref 11.5–15.5)
WBC Count: 3.9 10*3/uL — ABNORMAL LOW (ref 4.0–10.5)
nRBC: 0 % (ref 0.0–0.2)

## 2022-08-22 LAB — PROSTATE-SPECIFIC AG, SERUM (LABCORP): Prostate Specific Ag, Serum: 1.7 ng/mL (ref 0.0–4.0)

## 2022-08-23 ENCOUNTER — Inpatient Hospital Stay (HOSPITAL_BASED_OUTPATIENT_CLINIC_OR_DEPARTMENT_OTHER): Payer: PPO | Admitting: Oncology

## 2022-08-23 ENCOUNTER — Other Ambulatory Visit: Payer: Self-pay

## 2022-08-23 ENCOUNTER — Inpatient Hospital Stay: Payer: PPO

## 2022-08-23 ENCOUNTER — Other Ambulatory Visit: Payer: Self-pay | Admitting: Oncology

## 2022-08-23 ENCOUNTER — Other Ambulatory Visit (HOSPITAL_COMMUNITY): Payer: Self-pay

## 2022-08-23 VITALS — BP 113/80 | HR 100 | Temp 98.1°F | Resp 18 | Ht 67.0 in | Wt 212.3 lb

## 2022-08-23 DIAGNOSIS — C7951 Secondary malignant neoplasm of bone: Secondary | ICD-10-CM | POA: Diagnosis not present

## 2022-08-23 DIAGNOSIS — C61 Malignant neoplasm of prostate: Secondary | ICD-10-CM

## 2022-08-23 LAB — URINALYSIS, COMPLETE (UACMP) WITH MICROSCOPIC
Bacteria, UA: NONE SEEN
Bilirubin Urine: NEGATIVE
Glucose, UA: >1000 mg/dL — AB
Ketones, ur: 15 mg/dL — AB
Leukocytes,Ua: NEGATIVE
Nitrite: NEGATIVE
Protein, ur: NEGATIVE mg/dL
Specific Gravity, Urine: 1.041 — ABNORMAL HIGH (ref 1.005–1.030)
pH: 6 (ref 5.0–8.0)

## 2022-08-23 MED ORDER — HYDROCODONE-ACETAMINOPHEN 5-325 MG PO TABS: 1.0000 | ORAL_TABLET | Freq: Four times a day (QID) | ORAL | 0 refills | Status: DC | PRN

## 2022-08-23 MED ORDER — DENOSUMAB 120 MG/1.7ML ~~LOC~~ SOLN
120.0000 mg | Freq: Once | SUBCUTANEOUS | Status: AC
  Administered 2022-08-23: 120 mg via SUBCUTANEOUS
  Filled 2022-08-23: qty 1.7

## 2022-08-23 NOTE — Patient Instructions (Signed)
Denosumab Injection (Oncology) What is this medication? DENOSUMAB (den oh SUE mab) prevents weakened bones caused by cancer. It may also be used to treat noncancerous bone tumors that cannot be removed by surgery. It can also be used to treat high calcium levels in the blood caused by cancer. It works by blocking a protein that causes bones to break down quickly. This slows down the release of calcium from bones, which lowers calcium levels in your blood. It also makes your bones stronger and less likely to break (fracture). This medicine may be used for other purposes; ask your health care provider or pharmacist if you have questions. COMMON BRAND NAME(S): XGEVA What should I tell my care team before I take this medication? They need to know if you have any of these conditions: Dental disease Having surgery or tooth extraction Infection Kidney disease Low levels of calcium or vitamin D in the blood Malnutrition On hemodialysis Skin conditions or sensitivity Thyroid or parathyroid disease An unusual reaction to denosumab, other medications, foods, dyes, or preservatives Pregnant or trying to get pregnant Breast-feeding How should I use this medication? This medication is for injection under the skin. It is given by your care team in a hospital or clinic setting. A special MedGuide will be given to you before each treatment. Be sure to read this information carefully each time. Talk to your care team about the use of this medication in children. While it may be prescribed for children as young as 13 years for selected conditions, precautions do apply. Overdosage: If you think you have taken too much of this medicine contact a poison control center or emergency room at once. NOTE: This medicine is only for you. Do not share this medicine with others. What if I miss a dose? Keep appointments for follow-up doses. It is important not to miss your dose. Call your care team if you are unable to  keep an appointment. What may interact with this medication? Do not take this medication with any of the following: Other medications containing denosumab This medication may also interact with the following: Medications that lower your chance of fighting infection Steroid medications, such as prednisone or cortisone This list may not describe all possible interactions. Give your health care provider a list of all the medicines, herbs, non-prescription drugs, or dietary supplements you use. Also tell them if you smoke, drink alcohol, or use illegal drugs. Some items may interact with your medicine. What should I watch for while using this medication? Your condition will be monitored carefully while you are receiving this medication. You may need blood work while taking this medication. This medication may increase your risk of getting an infection. Call your care team for advice if you get a fever, chills, sore throat, or other symptoms of a cold or flu. Do not treat yourself. Try to avoid being around people who are sick. You should make sure you get enough calcium and vitamin D while you are taking this medication, unless your care team tells you not to. Discuss the foods you eat and the vitamins you take with your care team. Some people who take this medication have severe bone, joint, or muscle pain. This medication may also increase your risk for jaw problems or a broken thigh bone. Tell your care team right away if you have severe pain in your jaw, bones, joints, or muscles. Tell your care team if you have any pain that does not go away or that gets worse. Talk   to your care team if you may be pregnant. Serious birth defects can occur if you take this medication during pregnancy and for 5 months after the last dose. You will need a negative pregnancy test before starting this medication. Contraception is recommended while taking this medication and for 5 months after the last dose. Your care team  can help you find the option that works for you. What side effects may I notice from receiving this medication? Side effects that you should report to your care team as soon as possible: Allergic reactions--skin rash, itching, hives, swelling of the face, lips, tongue, or throat Bone, joint, or muscle pain Low calcium level--muscle pain or cramps, confusion, tingling, or numbness in the hands or feet Osteonecrosis of the jaw--pain, swelling, or redness in the mouth, numbness of the jaw, poor healing after dental work, unusual discharge from the mouth, visible bones in the mouth Side effects that usually do not require medical attention (report to your care team if they continue or are bothersome): Cough Diarrhea Fatigue Headache Nausea This list may not describe all possible side effects. Call your doctor for medical advice about side effects. You may report side effects to FDA at 1-800-FDA-1088. Where should I keep my medication? This medication is given in a hospital or clinic. It will not be stored at home. NOTE: This sheet is a summary. It may not cover all possible information. If you have questions about this medicine, talk to your doctor, pharmacist, or health care provider.  2023 Elsevier/Gold Standard (2021-10-02 00:00:00)  

## 2022-08-23 NOTE — Progress Notes (Signed)
  [place] OFFICE PROGRESS NOTE   Diagnosis: Prostate cancer  INTERVAL HISTORY:   Mr Saldanha returns as scheduled.  He continues Xtandi.  He reports increased pain at the mid back and right iliac areas.  The pain is not adequately relieved with over-the-counter medications.  He has low abdominal discomfort when his bladder is "full ".  He has noted bubbles in the urine.  This has occurred for the past year.  No bleeding or burning with urination.  Objective:  Vital signs in last 24 hours:  Blood pressure 113/80, pulse 100, temperature 98.1 F (36.7 C), temperature source Oral, resp. rate 18, height 5\' 7"  (1.702 m), weight 212 lb 4.8 oz (96.3 kg), SpO2 98 %.    Resp: Lungs clear bilaterally Cardio: Regular rate and rhythm GI: No mass, nontender, no hepatosplenomegaly Vascular: No leg edema Musculoskeletal: Mild tenderness right iliac   Lab Results:  Lab Results  Component Value Date   WBC 3.9 (L) 08/21/2022   HGB 14.5 08/21/2022   HCT 42.5 08/21/2022   MCV 90.0 08/21/2022   PLT 270 08/21/2022   NEUTROABS 2.8 08/21/2022    CMP  Lab Results  Component Value Date   NA 135 08/21/2022   K 4.4 08/21/2022   CL 101 08/21/2022   CO2 25 08/21/2022   GLUCOSE 150 (H) 08/21/2022   BUN 14 08/21/2022   CREATININE 0.65 08/21/2022   CALCIUM 9.9 08/21/2022   PROT 7.0 08/21/2022   ALBUMIN 4.5 08/21/2022   AST 14 (L) 08/21/2022   ALT 14 08/21/2022   ALKPHOS 65 08/21/2022   BILITOT 0.6 08/21/2022   GFRNONAA >60 08/21/2022   GFRAA >60 12/31/2019    Medications: I have reviewed the patient's current medications.   Assessment/Plan: Prostate cancer Prostatectomy and pelvic lymphadenectomy 03/05/2016-Gleason 4+3 equal 7, 0/9 nodes, extraprostatic extension,pT3b,pN0 Radiation to the prostatic bed completed 09/28/2016, 68.4 Gray in 38 fractions Recurrent disease June 2020, status post radiation to the isolated left iliac metastasis Lupron beginning August 2020   Zytiga August 2020-discontinued due to lack of insurance coverage Rising PSA with MRI confirming progressive disease at multiple vertebrae Radiation to T11 and L4 completed 02/04/2020 30 Gray in 10 fractions Xtandi starting 01/20/2020 CTs 10/18/2021-new T8 metastasis and other stable bone metastases, no evidence of extraosseous metastases Bone scan 10/18/2021-new T8 metastasis, stable uptake in the left iliac bone Radiation to T8 metastasis September 2023, 30 Gray in 10 fractions Guardant360 04/14/2022- AR T878A, BRCA2 VUS MSI high not detected  2.  Diabetes     Disposition: Mr. Andrada has metastatic prostate cancer.  The PSA remains in the normal range, but has been higher over the past several months.  He continues Xtandi, every 2 months Xgeva, and every 101-month leuprolide.  He will be referred for a staging PMSA PET.  He will return for an office visit after the PET to discuss treatment options which will include docetaxel and Pluvicto.  I reviewed the May 2023 bone scan images with him today.  He will begin a trial of hydrocodone to use as needed for pain.  [name], MD  08/23/2022  9:18 AM

## 2022-08-24 ENCOUNTER — Telehealth: Payer: Self-pay | Admitting: *Deleted

## 2022-08-24 ENCOUNTER — Other Ambulatory Visit: Payer: Self-pay

## 2022-08-24 NOTE — Telephone Encounter (Signed)
Notified Wesley White of PET(PSMA) scan at [facility] on 09/13/22 @ 2:40 pm w/arrival 30 minutes prior. No prep. Also made aware that urinalysis shows no infection, but he does have a lot of glucose in urine. Made aware of his f/u with Dr. Benay Spice on 4/23.

## 2022-08-28 ENCOUNTER — Telehealth: Payer: Self-pay

## 2022-08-28 DIAGNOSIS — E119 Type 2 diabetes mellitus without complications: Secondary | ICD-10-CM

## 2022-08-28 MED ORDER — METFORMIN HCL 850 MG PO TABS: ORAL_TABLET | ORAL | 0 refills | Status: DC

## 2022-08-28 NOTE — Telephone Encounter (Signed)
Pt's wife called to request a refill for metformin. Last sent on 04/26/22, 180 tabs with 0 refills.

## 2022-09-08 ENCOUNTER — Other Ambulatory Visit: Payer: Self-pay | Admitting: Family Medicine

## 2022-09-08 DIAGNOSIS — E119 Type 2 diabetes mellitus without complications: Secondary | ICD-10-CM

## 2022-09-13 ENCOUNTER — Ambulatory Visit (HOSPITAL_COMMUNITY)
Admission: RE | Admit: 2022-09-13 | Discharge: 2022-09-13 | Disposition: A | Discharge status: Home / Self Care | Payer: PPO | Source: Ambulatory Visit | Attending: Oncology | Admitting: Oncology

## 2022-09-13 DIAGNOSIS — C7951 Secondary malignant neoplasm of bone: Secondary | ICD-10-CM | POA: Diagnosis not present

## 2022-09-13 DIAGNOSIS — C61 Malignant neoplasm of prostate: Secondary | ICD-10-CM | POA: Diagnosis not present

## 2022-09-13 MED ORDER — PIFLIFOLASTAT F 18 (PYLARIFY) INJECTION
9.0000 | Freq: Once | INTRAVENOUS | Status: AC
  Administered 2022-09-13: 9.2 via INTRAVENOUS

## 2022-09-18 ENCOUNTER — Inpatient Hospital Stay: Payer: PPO | Attending: Oncology | Admitting: Oncology

## 2022-09-18 ENCOUNTER — Other Ambulatory Visit (HOSPITAL_COMMUNITY): Payer: Self-pay

## 2022-09-18 VITALS — BP 135/74 | HR 100 | Temp 98.1°F | Resp 18 | Ht 67.0 in | Wt 218.4 lb

## 2022-09-18 DIAGNOSIS — C7951 Secondary malignant neoplasm of bone: Secondary | ICD-10-CM | POA: Diagnosis not present

## 2022-09-18 DIAGNOSIS — C61 Malignant neoplasm of prostate: Secondary | ICD-10-CM | POA: Diagnosis not present

## 2022-09-18 DIAGNOSIS — Z79899 Other long term (current) drug therapy: Secondary | ICD-10-CM | POA: Diagnosis not present

## 2022-09-18 DIAGNOSIS — Z923 Personal history of irradiation: Secondary | ICD-10-CM | POA: Insufficient documentation

## 2022-09-18 DIAGNOSIS — E119 Type 2 diabetes mellitus without complications: Secondary | ICD-10-CM | POA: Insufficient documentation

## 2022-09-18 NOTE — Progress Notes (Signed)
[facility] OFFICE PROGRESS NOTE   Diagnosis: Prostate cancer  INTERVAL HISTORY:   Mr. Dec returns as scheduled.  He continues Xtandi.  He generally feels well.  He was able to travel to [place] to view the solar eclipse.  He continues to have mild pain at the right iliac and mid back.  He takes ibuprofen and naproxen as needed.  He has not started hydrocodone.  Objective:  Vital signs in last 24 hours:  Blood pressure 135/74, pulse 100, temperature 98.1 F (36.7 C), temperature source Oral, resp. rate 18, height  (1.702 m), weight 218 lb 6.4 oz (99.1 kg), SpO2 98 %.   Resp: Lungs clear bilaterally Cardio: Regular rate and rhythm GI: No hepatosplenomegaly Vascular: No leg edema Musculoskeletal: No spinal or iliac tenderness    Lab Results:  Lab Results  Component Value Date   WBC 3.9 (L) 08/21/2022   HGB 14.5 08/21/2022   HCT 42.5 08/21/2022   MCV 90.0 08/21/2022   PLT 270 08/21/2022   NEUTROABS 2.8 08/21/2022    CMP  Lab Results  Component Value Date   NA 135 08/21/2022   K 4.4 08/21/2022   CL 101 08/21/2022   CO2 25 08/21/2022   GLUCOSE 150 (H) 08/21/2022   BUN 14 08/21/2022   CREATININE 0.65 08/21/2022   CALCIUM 9.9 08/21/2022   PROT 7.0 08/21/2022   ALBUMIN 4.5 08/21/2022   AST 14 (L) 08/21/2022   ALT 14 08/21/2022   ALKPHOS 65 08/21/2022   BILITOT 0.6 08/21/2022   GFRNONAA >60 08/21/2022   GFRAA >60 12/31/2019     Imaging:  Prostate PET 09/13/2022 reviewed with Mr. Honse  Medications: I have reviewed the patient's current medications.   Assessment/Plan: Prostate cancer Prostatectomy and pelvic lymphadenectomy 03/05/2016-Gleason 4+3 equal 7, 0/9 nodes, extraprostatic extension,pT3b,pN0 Radiation to the prostatic bed completed 09/28/2016, 68.4 Gray in 38 fractions Recurrent disease June 2020, status post radiation to the isolated left iliac metastasis Lupron beginning August 2020  Zytiga August 2020-discontinued due to  lack of insurance coverage Rising PSA with MRI confirming progressive disease at multiple vertebrae Radiation to T11 and L4 completed 02/04/2020 30 Gray in 10 fractions Xtandi starting 01/20/2020 CTs 10/18/2021-new T8 metastasis and other stable bone metastases, no evidence of extraosseous metastases Bone scan 10/18/2021-new T8 metastasis, stable uptake in the left iliac bone Radiation to T8 metastasis September 2023, 30 Gray in 10 fractions Guardant360 04/14/2022- AR T878A, BRCA2 VUS MSI high not detected 09/13/2022-PSMA PET-multiple sclerotic bone metastases, medial left iliac lesion with an SUV of 5.5, sclerotic T8 lesion with an SUV of 7.4, other sclerotic lesions are similar to the 10/18/2021 CT, no evidence of extraosseous metastases, new small pericardial effusion  2.  Diabetes     Disposition: Mr. Hietala has metastatic prostate cancer.  I reviewed the PSMA PET findings and images with him.  There are multiple sclerotic bone lesions including hypermetabolic lesions at the left iliac and T8 areas.  Other lesions do not appear hypermetabolic.  There is no evidence of extraosseous metastatic disease.  His overall clinical status appears unchanged.  The PSA is higher within the normal range over the past 6 months.  We discussed treatment options.  We discussed continuing Xtandi/leuprolide versus beginning docetaxel.  We discussed the lack of easily measured disease given the normal PSA and limited hypermetabolic bone lesions.  Of the bone lesions do not appear to take up the PSMA agent.  We discussed his quality of life now versus while on  chemotherapy.  We decided to continue the current systemic therapy for now.  He will return for an office visit, Xgeva, and Lupron in 2 months.  He will continue Xtandi.    Thornton Papas, MD  09/18/2022  9:48 AM

## 2022-09-19 ENCOUNTER — Other Ambulatory Visit (HOSPITAL_COMMUNITY): Payer: Self-pay

## 2022-09-25 DIAGNOSIS — G4733 Obstructive sleep apnea (adult) (pediatric): Secondary | ICD-10-CM | POA: Diagnosis not present

## 2022-10-16 ENCOUNTER — Other Ambulatory Visit: Payer: Self-pay

## 2022-10-16 ENCOUNTER — Other Ambulatory Visit: Payer: Self-pay | Admitting: Oncology

## 2022-10-16 DIAGNOSIS — C61 Malignant neoplasm of prostate: Secondary | ICD-10-CM

## 2022-10-17 ENCOUNTER — Other Ambulatory Visit: Payer: Self-pay

## 2022-10-17 MED ORDER — ENZALUTAMIDE 40 MG PO TABS
ORAL_TABLET | ORAL | 2 refills | Status: DC
Start: 2022-10-17 — End: 2023-01-30
  Filled 2022-10-17: qty 120, 30d supply, fill #0
  Filled 2022-11-14: qty 120, 30d supply, fill #1
  Filled 2022-12-11: qty 120, 30d supply, fill #2

## 2022-10-24 ENCOUNTER — Other Ambulatory Visit (HOSPITAL_COMMUNITY): Payer: Self-pay

## 2022-10-29 ENCOUNTER — Inpatient Hospital Stay: Payer: PPO | Attending: Oncology

## 2022-10-29 DIAGNOSIS — E119 Type 2 diabetes mellitus without complications: Secondary | ICD-10-CM | POA: Diagnosis not present

## 2022-10-29 DIAGNOSIS — C7951 Secondary malignant neoplasm of bone: Secondary | ICD-10-CM | POA: Insufficient documentation

## 2022-10-29 DIAGNOSIS — Z5111 Encounter for antineoplastic chemotherapy: Secondary | ICD-10-CM | POA: Diagnosis not present

## 2022-10-29 DIAGNOSIS — Z923 Personal history of irradiation: Secondary | ICD-10-CM | POA: Insufficient documentation

## 2022-10-29 DIAGNOSIS — Z79899 Other long term (current) drug therapy: Secondary | ICD-10-CM | POA: Insufficient documentation

## 2022-10-29 DIAGNOSIS — C61 Malignant neoplasm of prostate: Secondary | ICD-10-CM | POA: Insufficient documentation

## 2022-10-29 DIAGNOSIS — M545 Low back pain, unspecified: Secondary | ICD-10-CM | POA: Diagnosis not present

## 2022-10-29 LAB — CMP (CANCER CENTER ONLY)
ALT: 11 U/L (ref 0–44)
AST: 12 U/L — ABNORMAL LOW (ref 15–41)
Albumin: 4.2 g/dL (ref 3.5–5.0)
Alkaline Phosphatase: 114 U/L (ref 38–126)
Anion gap: 9 (ref 5–15)
BUN: 14 mg/dL (ref 8–23)
CO2: 26 mmol/L (ref 22–32)
Calcium: 9.4 mg/dL (ref 8.9–10.3)
Chloride: 101 mmol/L (ref 98–111)
Creatinine: 0.64 mg/dL (ref 0.61–1.24)
GFR, Estimated: >60 mL/min (ref >60)
Glucose, Bld: 215 mg/dL — ABNORMAL HIGH (ref 70–99)
Potassium: 5 mmol/L (ref 3.5–5.1)
Sodium: 136 mmol/L (ref 135–145)
Total Bilirubin: 0.5 mg/dL (ref 0.3–1.2)
Total Protein: 7 g/dL (ref 6.5–8.1)

## 2022-10-29 LAB — CBC WITH DIFFERENTIAL (CANCER CENTER ONLY)
Abs Immature Granulocytes: 0.03 10*3/uL (ref 0.00–0.07)
Basophils Absolute: 0 10*3/uL (ref 0.0–0.1)
Basophils Relative: 1 %
Eosinophils Absolute: 0.1 10*3/uL (ref 0.0–0.5)
Eosinophils Relative: 1 %
HCT: 40.7 % (ref 39.0–52.0)
Hemoglobin: 13.6 g/dL (ref 13.0–17.0)
Immature Granulocytes: 1 %
Lymphocytes Relative: 22 %
Lymphs Abs: 0.9 10*3/uL (ref 0.7–4.0)
MCH: 30.5 pg (ref 26.0–34.0)
MCHC: 33.4 g/dL (ref 30.0–36.0)
MCV: 91.3 fL (ref 80.0–100.0)
Monocytes Absolute: 0.4 10*3/uL (ref 0.1–1.0)
Monocytes Relative: 9 %
Neutro Abs: 2.6 10*3/uL (ref 1.7–7.7)
Neutrophils Relative %: 66 %
Platelet Count: 282 10*3/uL (ref 150–400)
RBC: 4.46 MIL/uL (ref 4.22–5.81)
RDW: 14.2 % (ref 11.5–15.5)
WBC Count: 3.9 10*3/uL — ABNORMAL LOW (ref 4.0–10.5)
nRBC: 0 % (ref 0.0–0.2)

## 2022-10-30 LAB — PROSTATE-SPECIFIC AG, SERUM (LABCORP): Prostate Specific Ag, Serum: 3.9 ng/mL (ref 0.0–4.0)

## 2022-10-31 ENCOUNTER — Telehealth: Payer: Self-pay | Admitting: Radiation Therapy

## 2022-10-31 ENCOUNTER — Other Ambulatory Visit: Payer: Self-pay | Admitting: Radiation Therapy

## 2022-10-31 ENCOUNTER — Inpatient Hospital Stay: Payer: PPO

## 2022-10-31 ENCOUNTER — Inpatient Hospital Stay (HOSPITAL_BASED_OUTPATIENT_CLINIC_OR_DEPARTMENT_OTHER): Payer: PPO | Admitting: Oncology

## 2022-10-31 VITALS — BP 108/74 | HR 92 | Temp 98.1°F | Resp 20 | Ht 67.0 in | Wt 213.0 lb

## 2022-10-31 DIAGNOSIS — C7951 Secondary malignant neoplasm of bone: Secondary | ICD-10-CM

## 2022-10-31 DIAGNOSIS — C61 Malignant neoplasm of prostate: Secondary | ICD-10-CM

## 2022-10-31 DIAGNOSIS — Z192 Hormone resistant malignancy status: Secondary | ICD-10-CM | POA: Diagnosis not present

## 2022-10-31 DIAGNOSIS — Z5111 Encounter for antineoplastic chemotherapy: Secondary | ICD-10-CM | POA: Diagnosis not present

## 2022-10-31 MED ORDER — DENOSUMAB 120 MG/1.7ML ~~LOC~~ SOLN
120.0000 mg | Freq: Once | SUBCUTANEOUS | Status: AC
  Administered 2022-10-31: 120 mg via SUBCUTANEOUS
  Filled 2022-10-31: qty 1.7

## 2022-10-31 MED ORDER — LEUPROLIDE ACETATE (4 MONTH) 30 MG ~~LOC~~ KIT
30.0000 mg | PACK | Freq: Once | SUBCUTANEOUS | Status: AC
  Administered 2022-10-31: 30 mg via SUBCUTANEOUS
  Filled 2022-10-31: qty 30

## 2022-10-31 NOTE — Patient Instructions (Signed)
Leuprolide Suspension for Injection (Prostate Cancer) What is this medication? LEUPROLIDE (loo PROE lide) reduces the symptoms of prostate cancer. It works by decreasing levels of the hormone testosterone in the body. This prevents prostate cancer cells from spreading or growing. This medicine may be used for other purposes; ask your health care provider or pharmacist if you have questions. COMMON BRAND NAME(S): Eligard, Lupron Depot, Lupron Depot-Ped, Lutrate Depot, Viadur What should I tell my care team before I take this medication? They need to know if you have any of these conditions: Diabetes Heart disease Heart failure High or low levels of electrolytes, such as magnesium, potassium, or sodium in your blood Irregular heartbeat or rhythm Seizures An unusual or allergic reaction to leuprolide, other medications, foods, dyes, or preservatives Pregnant or trying to get pregnant Breast-feeding How should I use this medication? This medication is injected under the skin or into a muscle. It is given by your care team in a hospital or clinic setting. Talk to your care team about the use of this medication in children. Special care may be needed. Overdosage: If you think you have taken too much of this medicine contact a poison control center or emergency room at once. NOTE: This medicine is only for you. Do not share this medicine with others. What if I miss a dose? Keep appointments for follow-up doses. It is important not to miss your dose. Call your care team if you are unable to keep an appointment. What may interact with this medication? Do not take this medication with any of the following: Cisapride Dronedarone Ketoconazole Levoketoconazole Pimozide Thioridazine This medication may also interact with the following: Other medications that cause heart rhythm changes This list may not describe all possible interactions. Give your health care provider a list of all the medicines,  herbs, non-prescription drugs, or dietary supplements you use. Also tell them if you smoke, drink alcohol, or use illegal drugs. Some items may interact with your medicine. What should I watch for while using this medication? Visit your care team for regular checks on your progress. Tell your care team if your symptoms do not start to get better or if they get worse. This medication may increase blood sugar. The risk may be higher in patients who already have diabetes. Ask your care team what you can do to lower the risk of diabetes while taking this medication. This medication may cause infertility. Talk to your care team if you are concerned about your fertility. Heart attacks and strokes have been reported with the use of this medication. Get emergency help if you develop signs or symptoms of a heart attack or stroke. Talk to your care team about the risks and benefits of this medication. What side effects may I notice from receiving this medication? Side effects that you should report to your care team as soon as possible: Allergic reactions--skin rash, itching, hives, swelling of the face, lips, tongue, or throat Heart attack--pain or tightness in the chest, shoulders, arms, or jaw, nausea, shortness of breath, cold or clammy skin, feeling faint or lightheaded Heart rhythm changes--fast or irregular heartbeat, dizziness, feeling faint or lightheaded, chest pain, trouble breathing High blood sugar (hyperglycemia)--increased thirst or amount of urine, unusual weakness or fatigue, blurry vision Mood swings, irritability, hostility Seizures Stroke--sudden numbness or weakness of the face, arm, or leg, trouble speaking, confusion, trouble walking, loss of balance or coordination, dizziness, severe headache, change in vision Thoughts of suicide or self-harm, worsening mood, feelings of depression Side   effects that usually do not require medical attention (report to your care team if they continue or  are bothersome): Bone pain Change in sex drive or performance General discomfort and fatigue Hot flashes Muscle pain Pain, redness, or irritation at injection site Swelling of the ankles, hands, or feet This list may not describe all possible side effects. Call your doctor for medical advice about side effects. You may report side effects to FDA at 1-800-FDA-1088. Where should I keep my medication? This medication is given in a hospital or clinic. It will not be stored at home. NOTE: This sheet is a summary. It may not cover all possible information. If you have questions about this medicine, talk to your doctor, pharmacist, or health care provider.  2024 Elsevier/Gold Standard (2021-07-26 00:00:00) Denosumab Injection (Oncology) What is this medication? DENOSUMAB (den oh SUE mab) prevents weakened bones caused by cancer. It may also be used to treat noncancerous bone tumors that cannot be removed by surgery. It can also be used to treat high calcium levels in the blood caused by cancer. It works by blocking a protein that causes bones to break down quickly. This slows down the release of calcium from bones, which lowers calcium levels in your blood. It also makes your bones stronger and less likely to break (fracture). This medicine may be used for other purposes; ask your health care provider or pharmacist if you have questions. COMMON BRAND NAME(S): XGEVA What should I tell my care team before I take this medication? They need to know if you have any of these conditions: Dental disease Having surgery or tooth extraction Infection Kidney disease Low levels of calcium or vitamin D in the blood Malnutrition On hemodialysis Skin conditions or sensitivity Thyroid or parathyroid disease An unusual reaction to denosumab, other medications, foods, dyes, or preservatives Pregnant or trying to get pregnant Breast-feeding How should I use this medication? This medication is for injection  under the skin. It is given by your care team in a hospital or clinic setting. A special MedGuide will be given to you before each treatment. Be sure to read this information carefully each time. Talk to your care team about the use of this medication in children. While it may be prescribed for children as Wilmarie Sparlin as 13 years for selected conditions, precautions do apply. Overdosage: If you think you have taken too much of this medicine contact a poison control center or emergency room at once. NOTE: This medicine is only for you. Do not share this medicine with others. What if I miss a dose? Keep appointments for follow-up doses. It is important not to miss your dose. Call your care team if you are unable to keep an appointment. What may interact with this medication? Do not take this medication with any of the following: Other medications containing denosumab This medication may also interact with the following: Medications that lower your chance of fighting infection Steroid medications, such as prednisone or cortisone This list may not describe all possible interactions. Give your health care provider a list of all the medicines, herbs, non-prescription drugs, or dietary supplements you use. Also tell them if you smoke, drink alcohol, or use illegal drugs. Some items may interact with your medicine. What should I watch for while using this medication? Your condition will be monitored carefully while you are receiving this medication. You may need blood work while taking this medication. This medication may increase your risk of getting an infection. Call your care team for   advice if you get a fever, chills, sore throat, or other symptoms of a cold or flu. Do not treat yourself. Try to avoid being around people who are sick. You should make sure you get enough calcium and vitamin D while you are taking this medication, unless your care team tells you not to. Discuss the foods you eat and the  vitamins you take with your care team. Some people who take this medication have severe bone, joint, or muscle pain. This medication may also increase your risk for jaw problems or a broken thigh bone. Tell your care team right away if you have severe pain in your jaw, bones, joints, or muscles. Tell your care team if you have any pain that does not go away or that gets worse. Talk to your care team if you may be pregnant. Serious birth defects can occur if you take this medication during pregnancy and for 5 months after the last dose. You will need a negative pregnancy test before starting this medication. Contraception is recommended while taking this medication and for 5 months after the last dose. Your care team can help you find the option that works for you. What side effects may I notice from receiving this medication? Side effects that you should report to your care team as soon as possible: Allergic reactions--skin rash, itching, hives, swelling of the face, lips, tongue, or throat Bone, joint, or muscle pain Low calcium level--muscle pain or cramps, confusion, tingling, or numbness in the hands or feet Osteonecrosis of the jaw--pain, swelling, or redness in the mouth, numbness of the jaw, poor healing after dental work, unusual discharge from the mouth, visible bones in the mouth Side effects that usually do not require medical attention (report to your care team if they continue or are bothersome): Cough Diarrhea Fatigue Headache Nausea This list may not describe all possible side effects. Call your doctor for medical advice about side effects. You may report side effects to FDA at 1-800-FDA-1088. Where should I keep my medication? This medication is given in a hospital or clinic. It will not be stored at home. NOTE: This sheet is a summary. It may not cover all possible information. If you have questions about this medicine, talk to your doctor, pharmacist, or health care provider.   2024 Elsevier/Gold Standard (2021-10-04 00:00:00)  

## 2022-10-31 NOTE — Telephone Encounter (Signed)
Called to introduce myself to the patient and to discuss his upcoming radiation treatment planning appointments. Mr. Winterrowd was very happy for the call and has my contact information to call back if he has any other questions.   Jalene Mullet R.T.(R)(T) Radiation Special Procedures Navigator

## 2022-10-31 NOTE — Progress Notes (Signed)
  [facility] OFFICE PROGRESS NOTE   Diagnosis: Prostate cancer  INTERVAL HISTORY:   [facility] returns as scheduled.  He continues Xtandi.  He has lower back pain.  He takes ibuprofen and naproxen.  He has not used hydrocodone.  Good appetite.  Objective:  Vital signs in last 24 hours:  Blood pressure 108/74, pulse 92, temperature 98.1 F (36.7 C), resp. rate 20, height 5\' 7"  (1.702 m), weight 213 lb (96.6 kg), SpO2 98 %.    Resp: Lungs clear bilaterally Cardio: Regular rate and rhythm GI: No hepatosplenomegaly, no mass, nontender Vascular: No leg edema Musculoskeletal: No spine or iliac tenderness, the area of pain localizes to the low thoracic/upper lumbar region  Lab Results:  Lab Results  Component Value Date   WBC 3.9 (L) 10/29/2022   HGB 13.6 10/29/2022   HCT 40.7 10/29/2022   MCV 91.3 10/29/2022   PLT 282 10/29/2022   NEUTROABS 2.6 10/29/2022    CMP  Lab Results  Component Value Date   NA 136 10/29/2022   K 5.0 10/29/2022   CL 101 10/29/2022   CO2 26 10/29/2022   GLUCOSE 215 (H) 10/29/2022   BUN 14 10/29/2022   CREATININE 0.64 10/29/2022   CALCIUM 9.4 10/29/2022   PROT 7.0 10/29/2022   ALBUMIN 4.2 10/29/2022   AST 12 (L) 10/29/2022   ALT 11 10/29/2022   ALKPHOS 114 10/29/2022   BILITOT 0.5 10/29/2022   GFRNONAA >60 10/29/2022   GFRAA >60 12/31/2019     Medications: I have reviewed the patient's current medications.   Assessment/Plan:  Prostate cancer Prostatectomy and pelvic lymphadenectomy 03/05/2016-Gleason 4+3 equal 7, 0/9 nodes, extraprostatic extension,pT3b,pN0 Radiation to the prostatic bed completed 09/28/2016, 68.4 Gray in 38 fractions Recurrent disease June 2020, status post radiation to the isolated left iliac metastasis Lupron beginning August 2020  Zytiga August 2020-discontinued due to lack of insurance coverage Rising PSA with MRI confirming progressive disease at multiple vertebrae Radiation to T11 and L4  completed 02/04/2020 30 Gray in 10 fractions Xtandi starting 01/20/2020 CTs 10/18/2021-new T8 metastasis and other stable bone metastases, no evidence of extraosseous metastases Bone scan 10/18/2021-new T8 metastasis, stable uptake in the left iliac bone Radiation to T8 metastasis September 2023, 30 Gray in 10 fractions Guardant360 04/14/2022- AR T878A, BRCA2 VUS MSI high not detected 09/13/2022-PSMA PET-multiple sclerotic bone metastases, medial left iliac lesion with an SUV of 5.5, sclerotic T8 lesion with an SUV of 7.4, other sclerotic lesions are similar to the 10/18/2021 CT, no evidence of extraosseous metastases, new small pericardial effusion  2.  Diabetes     Disposition: [facility] appears stable.  The PSA is now in the upper end of the normal range.  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  We discussed treatment options including docetaxel and Pluvicto.  The plan is to continue Xtandi/leuprolide for now.  He will receive leuprolide and Xgeva today.  I will refer him back to Dr. Kathrynn Running to see whether he can receive additional radiation to the spine.  He will return for an office and lab visit in 2 months.  Wesley Papas, MD  10/31/2022  10:26 AM

## 2022-11-06 NOTE — Progress Notes (Incomplete)
Histology and Location of Primary Cancer: Prostate Ca with metastatic disease to bone  Prostatectomy and pelvic lymphadenectomy bilaterally in 2017.  Sites of Visceral and Bony Metastatic Disease: T8  Location(s) of Symptomatic Metastases: T8   09/13/2022 Dr. Thornton Papas NM PET (PSMA) Skull to Mid Thigh CLINICAL DATA:  Prostate cancer. Metastatic disease. Evaluate treatment response. Prostatectomy and pelvic lymphadenectomy bilaterally in 2017. On androgen deprivation therapy.   IMPRESSION: 1. Multifocal tracer avid osseous metastasis, relatively similar to 10/18/2021 abdominopelvic CT given cross modality comparison. 2. No evidence of extraosseous tracer avid metastasis. 3. Small pericardial effusion, new since 10/18/2021. 4.  Aortic Atherosclerosis (ICD10-I70.0).  Hepatic steatosis.   10/18/2021 Dr. Eli Hose CT Chest Abdomen Pelvis with Contrast CLINICAL DATA: Prostate cancer restaging.  * Tracking Code:  BO *  IMPRESSION: 1. New sclerosis anteriorly in the T8 vertebral body measuring 2 cm in diameter compatible with new or progressive metastatic disease compared to 11/01/2020. Otherwise scattered osseous lesions compatible with prior metastatic disease appear stable. 2. No adenopathy or current findings of active extra osseous malignancy. 3. Other imaging findings of potential clinical significance: Aortic Atherosclerosis (ICD10-I70.0). Stable small pericardial effusion. Hepatic cysts. Possible low-grade scler   10/18/2021 NM Bone Scan Whole Body Dr. Eli Hose CLINICAL DATA: Prostate cancer Mets, assess treatment response.   IMPRESSION: No focus of intense radiotracer activity in the T8 vertebral body corresponds with a new sclerotic lesion seen on same day CT and is consistent with osteoblastic metastatic disease.  Similar intense radiotracer uptake in the left iliac bone sclerotic metastatic lesion.   Past/Anticipated chemotherapy by medical oncology,  if any:   10/31/2022 Dr. Thornton Papas    Pain on a scale of 0-10 is: {Number; 1-10  not applicable:20727}    If Spine Met(s), symptoms, if any, include: Bowel/Bladder retention or incontinence (please describe): *** Numbness or weakness in extremities (please describe): *** Current Decadron regimen, if applicable: ***  Ambulatory status? Walker? Wheelchair?: {VQI Ambulatory Status:20974}  SAFETY ISSUES: Prior radiation? {:18581} Pacemaker/ICD? {:18581} Possible current pregnancy? Male Is the patient on methotrexate? No  Current Complaints / other details:  ***

## 2022-11-07 ENCOUNTER — Ambulatory Visit (HOSPITAL_COMMUNITY)
Admission: RE | Admit: 2022-11-07 | Discharge: 2022-11-07 | Disposition: A | Discharge status: Home / Self Care | Payer: PPO | Source: Ambulatory Visit | Attending: Radiation Oncology | Admitting: Radiation Oncology

## 2022-11-07 DIAGNOSIS — C7951 Secondary malignant neoplasm of bone: Secondary | ICD-10-CM | POA: Insufficient documentation

## 2022-11-07 DIAGNOSIS — C61 Malignant neoplasm of prostate: Secondary | ICD-10-CM | POA: Diagnosis not present

## 2022-11-07 DIAGNOSIS — C499 Malignant neoplasm of connective and soft tissue, unspecified: Secondary | ICD-10-CM | POA: Diagnosis not present

## 2022-11-07 MED ORDER — GADOBUTROL 1 MMOL/ML IV SOLN
9.0000 mL | Freq: Once | INTRAVENOUS | Status: AC | PRN
  Administered 2022-11-07: 9 mL via INTRAVENOUS

## 2022-11-08 ENCOUNTER — Ambulatory Visit: Payer: Self-pay | Admitting: Radiation Oncology

## 2022-11-08 ENCOUNTER — Encounter: Payer: Self-pay | Admitting: Radiation Oncology

## 2022-11-08 ENCOUNTER — Ambulatory Visit
Admission: RE | Admit: 2022-11-08 | Discharge: 2022-11-08 | Disposition: A | Discharge status: Home / Self Care | Payer: PPO | Source: Ambulatory Visit | Attending: Radiation Oncology | Admitting: Radiation Oncology

## 2022-11-08 ENCOUNTER — Other Ambulatory Visit: Payer: Self-pay

## 2022-11-08 VITALS — BP 103/73 | HR 101 | Temp 97.9°F | Resp 20 | Ht 69.0 in | Wt 216.6 lb

## 2022-11-08 DIAGNOSIS — C7951 Secondary malignant neoplasm of bone: Secondary | ICD-10-CM | POA: Diagnosis not present

## 2022-11-08 DIAGNOSIS — Z79899 Other long term (current) drug therapy: Secondary | ICD-10-CM | POA: Diagnosis not present

## 2022-11-08 DIAGNOSIS — C61 Malignant neoplasm of prostate: Secondary | ICD-10-CM | POA: Diagnosis not present

## 2022-11-08 DIAGNOSIS — Z510 Encounter for antineoplastic radiation therapy: Secondary | ICD-10-CM | POA: Insufficient documentation

## 2022-11-08 DIAGNOSIS — Z7984 Long term (current) use of oral hypoglycemic drugs: Secondary | ICD-10-CM | POA: Insufficient documentation

## 2022-11-08 DIAGNOSIS — Z192 Hormone resistant malignancy status: Secondary | ICD-10-CM | POA: Diagnosis not present

## 2022-11-08 DIAGNOSIS — Z923 Personal history of irradiation: Secondary | ICD-10-CM | POA: Insufficient documentation

## 2022-11-09 NOTE — Progress Notes (Signed)
Radiation Oncology         [phone]) [phone] ________________________________  Name: Wesley White MRN: [id number]  Date: 11/08/2022  DOB: 12-22-1950  Re-Evaluation Visit Note  CC: Ronnald Nian, MD  Ladene Artist, MD  Diagnosis:   72 yo man with progressive metastasis at previously treated T8 level and L-spine    ICD-10-CM   1. Metastatic cancer to spine (HCC)  C79.51     2. Malignant neoplasm of prostate metastatic to bone [facility])  C61    C79.51       Interval Since Last Radiation:  9 months 01/25/2022 through 02/08/2022 Site Technique Total Dose (Gy) Dose per Fx (Gy) Completed Fx Beam Energies  Thoracic Spine: Spine_T8 3D 30/30 3 10/10 10X, 15X   01/21/20 - 02/04/20: The patient received 30 Gy in 10 fractions to T11 to L4, inclusive.   12/09/2018 - 12/19/2018: The isolated oligometastasis (left iliac lesion) was treated to 40 Gy in 5 fractions of 8 Gy (SBRT)   08/08/2016 - 09/28/2016: The prostatic fossa was treated to 68.4 Gy in 38 fractions of 1.8 Gy  Narrative:  The patient returns today for re-evaluation.  Recent PSMA PET showed progressive metastasis at previously treated T8 level, and he has lower back pain.  MRI of T and L spine shows additional sites of disease in the L-spine at L4, L5 and S1 correlating with low back pain.  ALLERGIES:  has No Known Allergies.  Meds: Current Outpatient Medications  Medication Sig Dispense Refill   Calcium Carbonate (CALCIUM 600 PO) Take 600 mg by mouth daily.     enzalutamide (XTANDI) 40 MG tablet TAKE 4 TABLETS (160 MG TOTAL) BY MOUTH DAILY. 120 tablet 2   HYDROcodone-acetaminophen (NORCO) 5-325 MG tablet Take 1 tablet by mouth every 6 (six) hours as needed for moderate pain. Do not drive while taking hydrocodone (Patient not taking: Reported on 09/18/2022) 60 tablet 0   ibuprofen (ADVIL) 600 MG tablet Take 600 mg by mouth every 6 (six) hours as needed.     INVOKANA 300 MG TABS tablet TAKE 1 TABLET BY MOUTH EVERY DAY BEFORE BREAKFAST 90 tablet  1   lisinopril (ZESTRIL) 5 MG tablet TAKE 1 TABLET BY MOUTH EVERY DAY 90 tablet 1   metFORMIN (GLUCOPHAGE) 850 MG tablet TAKE 1 TABLET BY MOUTH TWICE A DAY WITH MEAL 180 tablet 0   naproxen sodium (ALEVE) 220 MG tablet Take 220 mg by mouth daily as needed.     OneTouch Delica Lancets 33G MISC See admin instructions.     ONETOUCH VERIO test strip USE AS DIRECTED AS NEEDED 100 strip 11   rosuvastatin (CRESTOR) 40 MG tablet TAKE 1 TABLET BY MOUTH EVERY DAY 90 tablet 1   No current facility-administered medications for this encounter.   Facility-Administered Medications Ordered in Other Encounters  Medication Dose Route Frequency Provider Last Rate Last Admin   magnesium citrate solution 1 Bottle  1 Bottle Oral Once Heloise Purpura, MD       sodium phosphate (FLEET) 7-19 GM/118ML enema 1 enema  1 enema Rectal Once Heloise Purpura, MD        Physical Findings: The patient is in no acute distress. Patient is alert and oriented.  height is 5\' 9"  (1.753 m) and weight is 216 lb 9.6 oz (98.2 kg). His temperature is 97.9 F (36.6 C). His blood pressure is 103/73 and his pulse is 101 (abnormal). His respiration is 20 and oxygen saturation is 97%. .  No significant changes.  Lab Findings: Lab Results  Component Value Date   WBC 3.9 (L) 10/29/2022   WBC 5.8 02/15/2016   HGB 13.6 10/29/2022   HGB 16.6 10/17/2018   HCT 40.7 10/29/2022   HCT 47.8 10/17/2018   PLT 282 10/29/2022   PLT 244 10/17/2018    Lab Results  Component Value Date   NA 136 10/29/2022   NA 136 10/17/2018   K 5.0 10/29/2022   CO2 26 10/29/2022   GLUCOSE 215 (H) 10/29/2022   BUN 14 10/29/2022   BUN 11 10/17/2018   CREATININE 0.64 10/29/2022   CREATININE 0.81 06/08/2015   BILITOT 0.5 10/29/2022   ALKPHOS 114 10/29/2022   AST 12 (L) 10/29/2022   ALT 11 10/29/2022   PROT 7.0 10/29/2022   PROT 7.1 10/17/2018   ALBUMIN 4.2 10/29/2022   ALBUMIN 4.7 10/17/2018   CALCIUM 9.4 10/29/2022   ANIONGAP 9 10/29/2022     Radiographic Findings: MR THORACIC SPINE W WO CONTRAST  Result Date: 11/08/2022 CLINICAL DATA:  Metastatic disease evaluation. Radiation planning for T8. EXAM: MRI THORACIC WITHOUT AND WITH CONTRAST TECHNIQUE: Multiplanar and multiecho pulse sequences of the thoracic spine were obtained without and with intravenous contrast. CONTRAST:  9mL GADAVIST GADOBUTROL 1 MMOL/ML IV SOLN COMPARISON:  01/07/2020 MRI.  PET scan 09/13/2022. FINDINGS: Alignment:  Slight worsening of kyphotic curvature.  Mild scoliosis. Vertebrae: T1: Slight progression of metastatic disease within the inferior T1 vertebral body. No extraosseous tumor. T2: Negative T3: Negative T4: Sclerotic metastasis within the posterior vertebral body, newly seen since the prior examination, diameter 1.5 cm. No extraosseous tumor. T5: Slight progression of 2 metastatic lesions within the posterior T5 vertebral body. No extraosseous tumor. T6: Small metastasis in the left pedicle. No extraosseous extension. T7: Fatty change related to previous radiation.  No T7 lesion. T8: Large sclerotic metastasis occupying the majority of the T8 vertebral body, new since the prior exam. No extraosseous extension. No posterior element involvement. T9: Signal abnormalities at the superior endplate of probably relate to metastatic disease. These could be degenerative but metastatic disease is favored. No extraosseous tumor. T10: Metastatic disease in the right transverse process and in the left transverse process. No extraosseous extension. T11: Tiny metastatic deposit in the central vertebral body. Probable metastatic disease in the right transverse process. T12: Previously treated metastatic disease in the left posterior vertebral body and posterior elements without progression. Cord:  No cord compression or metastatic disease affecting the cord. Paraspinal and other soft tissues: Negative Disc levels: Ordinary minimal disc bulges. No compressive narrowing of the canal  or foramina. IMPRESSION: Progression of metastatic disease at multiple levels as outlined above. No extraosseous extension. No cord compression or cord lesion. Most extensive disease is at the T8 level. The only levels without identified tumor are T2 and T3. Electronically Signed   By: Paulina Fusi M.D.   On: 11/08/2022 09:52   MR Lumbar Spine W Wo Contrast  Result Date: 11/08/2022 CLINICAL DATA:  Metastatic disease evaluation.  Prostate cancer. EXAM: MRI LUMBAR SPINE WITHOUT AND WITH CONTRAST TECHNIQUE: Multiplanar and multiecho pulse sequences of the lumbar spine were obtained without and with intravenous contrast. CONTRAST:  9mL GADAVIST GADOBUTROL 1 MMOL/ML IV SOLN COMPARISON:  01/07/2020 MRI.  PET scan 09/13/2022. FINDINGS: Segmentation: 5 lumbar type vertebral bodies as numbered previously. Alignment:  Normal Vertebrae: T12: Treated metastatic lesion affecting the left posterior vertebral body, left pedicle and left posterior elements. No progressive change. No extraosseous extension. L1: Negative L2: Negative L3: Treated metastatic lesion  at the right posterior vertebral body, right pedicle and right posterior elements. No progression. No extraosseous tumor. L4: Slight enlargement of a metastatic focus in the right posterosuperior corner of the vertebral body and under surface of the right pedicle. Slight progression of a tiny metastasis in the bone just underneath the superior endplate. Slight enlargement of a metastasis in the posterior left pedicle. No cortical breakthrough. No extraosseous tumor. L5: Slight enlargement of a metastasis in the posterior central vertebral body, now 7.5 mm, previously 6 mm. No cortical breakthrough. Newly seen metastasis in the left posterior L5 vertebral body measuring up to 1.4 cm in diameter. No cortical breakthrough or extraosseous tumor. S1: Slight enlargement of a previously seen lesion within the posterior vertebral body, maximal dimension presently 12.5 mm,  previously 11 mm. No extraosseous extension. Progression of a metastatic lesion within the left iliac bone, not fully evaluated. This is a larger lesion, measuring up to 3.6 cm. Conus medullaris and cauda equina: Conus extends to the T12-L1 level. Conus and cauda equina appear normal. Paraspinal and other soft tissues: Negative Disc levels: No significant disc level pathology. Minimal non-compressive disc bulges. No stenosis or neural compression. IMPRESSION: 1. Treated metastatic lesions at T12 and L3 without progression. 2. Slight enlargement of metastatic lesions at L4, L5 and S1. Newly seen metastasis in the left posterior L5 vertebral body measuring up to 1.4 cm. No cortical breakthrough or extraosseous tumor at these locations. 3. Progression of a metastatic lesion in the left iliac bone, not fully evaluated. This is a larger lesion, measuring up to 3.6 cm. Electronically Signed   By: Paulina Fusi M.D.   On: 11/08/2022 09:44    Impression:  The patient has progressive spinal metastases.  Plan:  Today, I talked to the patient and family about the findings and work-up thus far.  We discussed the natural history of spinal metastases and general treatment, highlighting the role of radiotherapy in the management.  We discussed the available radiation techniques, and focused on the details of logistics and delivery.  We reviewed the anticipated acute and late sequelae associated with radiation in this setting.  The patient was encouraged to ask questions that I answered to the best of my ability.    Due to previous conventional radiotherapy, I would recommend SBRT to T8 and conventional radiation to lumbar spine.  The patient would like to proceed with radiation and will be scheduled for CT simulation today  I personally spent 45 minutes in this encounter including chart review, reviewing radiological studies, meeting face-to-face with the patient, entering orders and completing documentation.     ________________________________  Marguarite Arbour, PA-C    Margaretmary Dys, MD  [facility]  Radiation Oncology Direct Dial: [phone]  Fax: [phone] [email or web address]  Skype  LinkedIn

## 2022-11-09 NOTE — Progress Notes (Signed)
  Radiation Oncology         [phone]) [phone] ________________________________  Name: Wesley White MRN: [id number]  Date: 11/08/2022  DOB: 1951-04-09  STEREOTACTIC BODY RADIOTHERAPY SIMULATION AND TREATMENT PLANNING NOTE    ICD-10-CM   1. Malignant neoplasm of prostate metastatic to bone (HCC)  C61    C79.51       DIAGNOSIS:  72 yo man with progressive metastasis at previously treated T8 thoracic spine level and new symptomatic metastases in L4, L5 and S1 spine levels  NARRATIVE:  The patient was brought to the CT Simulation planning suite.  Identity was confirmed.  All relevant records and images related to the planned course of therapy were reviewed.  The patient freely provided informed written consent to proceed with treatment after reviewing the details related to the planned course of therapy. The consent form was witnessed and verified by the simulation staff.  Then, the patient was set-up in a stable reproducible  supine position for radiation therapy.  A BodyFix immobilization pillow was fabricated for reproducible positioning.  Surface markings were placed.  The CT images were loaded into the planning software.  The gross target volumes (GTV) and planning target volumes (PTV) were delinieated, and avoidance structures were contoured.  Treatment planning then occurred.  The radiation prescription was entered and confirmed.  A total of two complex treatment devices were fabricated in the form of the BodyFix immobilization pillow and a neck accuform cushion.  I have requested : 3D Simulation  I have requested a DVH of the following structures: targets and all normal structures near the target including esophagus, heart, left lung, right lung spinal cord and skin as noted on the radiation plan to maintain doses in adherence with established limits  SPECIAL TREATMENT PROCEDURE:  The planned course of therapy using radiation constitutes a special treatment procedure. Special care is required in the  management of this patient for the following reasons. High dose per fraction requiring special monitoring for increased toxicities of treatment including daily imaging..  The special nature of the planned course of radiotherapy will require increased physician supervision and oversight to ensure patient's safety with optimal treatment outcomes.    This requires extended time and effort.    PLAN:  The patient will receive 40 Gy in 5 fractions to T8 with 30 Gy in 10 fractions to the L-spine.  ________________________________  Artist Pais. Kathrynn Running, M.D.

## 2022-11-13 DIAGNOSIS — C61 Malignant neoplasm of prostate: Secondary | ICD-10-CM | POA: Diagnosis not present

## 2022-11-13 DIAGNOSIS — Z510 Encounter for antineoplastic radiation therapy: Secondary | ICD-10-CM | POA: Diagnosis not present

## 2022-11-13 DIAGNOSIS — C7951 Secondary malignant neoplasm of bone: Secondary | ICD-10-CM | POA: Diagnosis not present

## 2022-11-13 DIAGNOSIS — Z192 Hormone resistant malignancy status: Secondary | ICD-10-CM | POA: Diagnosis not present

## 2022-11-14 ENCOUNTER — Ambulatory Visit
Admission: RE | Admit: 2022-11-14 | Discharge: 2022-11-14 | Disposition: A | Discharge status: Home / Self Care | Payer: PPO | Source: Ambulatory Visit | Attending: Radiation Oncology | Admitting: Radiation Oncology

## 2022-11-14 ENCOUNTER — Other Ambulatory Visit: Payer: Self-pay

## 2022-11-14 DIAGNOSIS — C7951 Secondary malignant neoplasm of bone: Secondary | ICD-10-CM | POA: Diagnosis not present

## 2022-11-14 DIAGNOSIS — C61 Malignant neoplasm of prostate: Secondary | ICD-10-CM | POA: Diagnosis not present

## 2022-11-14 DIAGNOSIS — Z510 Encounter for antineoplastic radiation therapy: Secondary | ICD-10-CM | POA: Diagnosis not present

## 2022-11-14 DIAGNOSIS — Z192 Hormone resistant malignancy status: Secondary | ICD-10-CM | POA: Diagnosis not present

## 2022-11-14 LAB — RAD ONC ARIA SESSION SUMMARY
Course Elapsed Days: 0
Plan Fractions Treated to Date: 1
Plan Fractions Treated to Date: 1
Plan Prescribed Dose Per Fraction: 3 Gy
Plan Prescribed Dose Per Fraction: 8 Gy
Plan Total Fractions Prescribed: 10
Plan Total Fractions Prescribed: 5
Plan Total Prescribed Dose: 30 Gy
Plan Total Prescribed Dose: 40 Gy
Reference Point Dosage Given to Date: 3 Gy
Reference Point Dosage Given to Date: 8 Gy
Reference Point Session Dosage Given: 3 Gy
Reference Point Session Dosage Given: 8 Gy
Session Number: 1

## 2022-11-15 ENCOUNTER — Other Ambulatory Visit: Payer: Self-pay

## 2022-11-15 ENCOUNTER — Ambulatory Visit
Admission: RE | Admit: 2022-11-15 | Discharge: 2022-11-15 | Disposition: A | Discharge status: Home / Self Care | Payer: PPO | Source: Ambulatory Visit | Attending: Radiation Oncology | Admitting: Radiation Oncology

## 2022-11-15 DIAGNOSIS — C61 Malignant neoplasm of prostate: Secondary | ICD-10-CM | POA: Diagnosis not present

## 2022-11-15 DIAGNOSIS — C7951 Secondary malignant neoplasm of bone: Secondary | ICD-10-CM | POA: Diagnosis not present

## 2022-11-15 DIAGNOSIS — Z510 Encounter for antineoplastic radiation therapy: Secondary | ICD-10-CM | POA: Diagnosis not present

## 2022-11-15 DIAGNOSIS — Z192 Hormone resistant malignancy status: Secondary | ICD-10-CM | POA: Diagnosis not present

## 2022-11-15 LAB — RAD ONC ARIA SESSION SUMMARY
Course Elapsed Days: 1
Plan Fractions Treated to Date: 2
Plan Prescribed Dose Per Fraction: 3 Gy
Plan Total Fractions Prescribed: 10
Plan Total Prescribed Dose: 30 Gy
Reference Point Dosage Given to Date: 6 Gy
Reference Point Session Dosage Given: 3 Gy
Session Number: 2

## 2022-11-16 ENCOUNTER — Ambulatory Visit
Admission: RE | Admit: 2022-11-16 | Discharge: 2022-11-16 | Disposition: A | Discharge status: Home / Self Care | Payer: PPO | Source: Ambulatory Visit | Attending: Radiation Oncology | Admitting: Radiation Oncology

## 2022-11-16 ENCOUNTER — Other Ambulatory Visit: Payer: Self-pay

## 2022-11-16 DIAGNOSIS — C7951 Secondary malignant neoplasm of bone: Secondary | ICD-10-CM | POA: Diagnosis not present

## 2022-11-16 DIAGNOSIS — C61 Malignant neoplasm of prostate: Secondary | ICD-10-CM | POA: Diagnosis not present

## 2022-11-16 DIAGNOSIS — Z510 Encounter for antineoplastic radiation therapy: Secondary | ICD-10-CM | POA: Diagnosis not present

## 2022-11-16 DIAGNOSIS — Z192 Hormone resistant malignancy status: Secondary | ICD-10-CM | POA: Diagnosis not present

## 2022-11-16 LAB — RAD ONC ARIA SESSION SUMMARY
Course Elapsed Days: 2
Plan Fractions Treated to Date: 2
Plan Fractions Treated to Date: 3
Plan Prescribed Dose Per Fraction: 3 Gy
Plan Prescribed Dose Per Fraction: 8 Gy
Plan Total Fractions Prescribed: 10
Plan Total Fractions Prescribed: 5
Plan Total Prescribed Dose: 30 Gy
Plan Total Prescribed Dose: 40 Gy
Reference Point Dosage Given to Date: 16 Gy
Reference Point Dosage Given to Date: 9 Gy
Reference Point Session Dosage Given: 3 Gy
Reference Point Session Dosage Given: 8 Gy
Session Number: 3

## 2022-11-19 ENCOUNTER — Ambulatory Visit
Admission: RE | Admit: 2022-11-19 | Discharge: 2022-11-19 | Disposition: A | Discharge status: Home / Self Care | Payer: PPO | Source: Ambulatory Visit | Attending: Radiation Oncology | Admitting: Radiation Oncology

## 2022-11-19 ENCOUNTER — Other Ambulatory Visit: Payer: Self-pay

## 2022-11-19 ENCOUNTER — Other Ambulatory Visit (HOSPITAL_COMMUNITY): Payer: Self-pay

## 2022-11-19 DIAGNOSIS — C61 Malignant neoplasm of prostate: Secondary | ICD-10-CM | POA: Diagnosis not present

## 2022-11-19 DIAGNOSIS — Z192 Hormone resistant malignancy status: Secondary | ICD-10-CM | POA: Diagnosis not present

## 2022-11-19 DIAGNOSIS — C7951 Secondary malignant neoplasm of bone: Secondary | ICD-10-CM

## 2022-11-19 DIAGNOSIS — Z510 Encounter for antineoplastic radiation therapy: Secondary | ICD-10-CM | POA: Diagnosis not present

## 2022-11-19 LAB — RAD ONC ARIA SESSION SUMMARY
Course Elapsed Days: 5
Plan Fractions Treated to Date: 3
Plan Fractions Treated to Date: 4
Plan Prescribed Dose Per Fraction: 3 Gy
Plan Prescribed Dose Per Fraction: 8 Gy
Plan Total Fractions Prescribed: 10
Plan Total Fractions Prescribed: 5
Plan Total Prescribed Dose: 30 Gy
Plan Total Prescribed Dose: 40 Gy
Reference Point Dosage Given to Date: 12 Gy
Reference Point Dosage Given to Date: 24 Gy
Reference Point Session Dosage Given: 3 Gy
Reference Point Session Dosage Given: 8 Gy
Session Number: 4

## 2022-11-20 ENCOUNTER — Ambulatory Visit: Admission: RE | Admit: 2022-11-20 | Payer: PPO | Source: Ambulatory Visit

## 2022-11-21 ENCOUNTER — Ambulatory Visit: Admission: RE | Admit: 2022-11-21 | Payer: PPO | Source: Ambulatory Visit | Admitting: Radiation Oncology

## 2022-11-21 ENCOUNTER — Ambulatory Visit
Admission: RE | Admit: 2022-11-21 | Discharge: 2022-11-21 | Disposition: A | Discharge status: Home / Self Care | Payer: PPO | Source: Ambulatory Visit | Attending: Radiation Oncology | Admitting: Radiation Oncology

## 2022-11-21 ENCOUNTER — Other Ambulatory Visit: Payer: Self-pay

## 2022-11-21 DIAGNOSIS — Z192 Hormone resistant malignancy status: Secondary | ICD-10-CM | POA: Diagnosis not present

## 2022-11-21 DIAGNOSIS — C7951 Secondary malignant neoplasm of bone: Secondary | ICD-10-CM | POA: Diagnosis not present

## 2022-11-21 DIAGNOSIS — C61 Malignant neoplasm of prostate: Secondary | ICD-10-CM | POA: Diagnosis not present

## 2022-11-21 DIAGNOSIS — Z510 Encounter for antineoplastic radiation therapy: Secondary | ICD-10-CM | POA: Diagnosis not present

## 2022-11-21 LAB — RAD ONC ARIA SESSION SUMMARY
Course Elapsed Days: 7
Plan Fractions Treated to Date: 4
Plan Fractions Treated to Date: 5
Plan Prescribed Dose Per Fraction: 3 Gy
Plan Prescribed Dose Per Fraction: 8 Gy
Plan Total Fractions Prescribed: 10
Plan Total Fractions Prescribed: 5
Plan Total Prescribed Dose: 30 Gy
Plan Total Prescribed Dose: 40 Gy
Reference Point Dosage Given to Date: 15 Gy
Reference Point Dosage Given to Date: 32 Gy
Reference Point Session Dosage Given: 3 Gy
Reference Point Session Dosage Given: 8 Gy
Session Number: 5

## 2022-11-22 ENCOUNTER — Ambulatory Visit
Admission: RE | Admit: 2022-11-22 | Discharge: 2022-11-22 | Disposition: A | Discharge status: Home / Self Care | Payer: PPO | Source: Ambulatory Visit | Attending: Radiation Oncology | Admitting: Radiation Oncology

## 2022-11-22 ENCOUNTER — Other Ambulatory Visit: Payer: Self-pay

## 2022-11-22 DIAGNOSIS — C61 Malignant neoplasm of prostate: Secondary | ICD-10-CM | POA: Diagnosis not present

## 2022-11-22 DIAGNOSIS — C7951 Secondary malignant neoplasm of bone: Secondary | ICD-10-CM | POA: Diagnosis not present

## 2022-11-22 DIAGNOSIS — Z192 Hormone resistant malignancy status: Secondary | ICD-10-CM | POA: Diagnosis not present

## 2022-11-22 DIAGNOSIS — Z510 Encounter for antineoplastic radiation therapy: Secondary | ICD-10-CM | POA: Diagnosis not present

## 2022-11-22 LAB — RAD ONC ARIA SESSION SUMMARY
Course Elapsed Days: 8
Plan Fractions Treated to Date: 6
Plan Prescribed Dose Per Fraction: 3 Gy
Plan Total Fractions Prescribed: 10
Plan Total Prescribed Dose: 30 Gy
Reference Point Dosage Given to Date: 18 Gy
Reference Point Session Dosage Given: 3 Gy
Session Number: 6

## 2022-11-23 ENCOUNTER — Ambulatory Visit
Admission: RE | Admit: 2022-11-23 | Discharge: 2022-11-23 | Disposition: A | Discharge status: Home / Self Care | Payer: PPO | Source: Ambulatory Visit | Attending: Radiation Oncology | Admitting: Radiation Oncology

## 2022-11-23 ENCOUNTER — Other Ambulatory Visit: Payer: Self-pay

## 2022-11-23 DIAGNOSIS — C61 Malignant neoplasm of prostate: Secondary | ICD-10-CM

## 2022-11-23 DIAGNOSIS — C7951 Secondary malignant neoplasm of bone: Secondary | ICD-10-CM | POA: Diagnosis not present

## 2022-11-23 DIAGNOSIS — Z510 Encounter for antineoplastic radiation therapy: Secondary | ICD-10-CM | POA: Diagnosis not present

## 2022-11-23 DIAGNOSIS — Z192 Hormone resistant malignancy status: Secondary | ICD-10-CM | POA: Diagnosis not present

## 2022-11-23 LAB — RAD ONC ARIA SESSION SUMMARY
Course Elapsed Days: 9
Plan Fractions Treated to Date: 5
Plan Fractions Treated to Date: 7
Plan Prescribed Dose Per Fraction: 3 Gy
Plan Prescribed Dose Per Fraction: 8 Gy
Plan Total Fractions Prescribed: 10
Plan Total Fractions Prescribed: 5
Plan Total Prescribed Dose: 30 Gy
Plan Total Prescribed Dose: 40 Gy
Reference Point Dosage Given to Date: 21 Gy
Reference Point Dosage Given to Date: 40 Gy
Reference Point Session Dosage Given: 3 Gy
Reference Point Session Dosage Given: 8 Gy
Session Number: 7

## 2022-11-24 ENCOUNTER — Other Ambulatory Visit: Payer: Self-pay | Admitting: Family Medicine

## 2022-11-24 DIAGNOSIS — E119 Type 2 diabetes mellitus without complications: Secondary | ICD-10-CM

## 2022-11-24 NOTE — Telephone Encounter (Signed)
Has an appt in December 2024

## 2022-11-26 ENCOUNTER — Other Ambulatory Visit: Payer: Self-pay

## 2022-11-26 ENCOUNTER — Ambulatory Visit
Admission: RE | Admit: 2022-11-26 | Discharge: 2022-11-26 | Disposition: A | Discharge status: Home / Self Care | Payer: PPO | Source: Ambulatory Visit | Attending: Radiation Oncology | Admitting: Radiation Oncology

## 2022-11-26 DIAGNOSIS — C7951 Secondary malignant neoplasm of bone: Secondary | ICD-10-CM | POA: Insufficient documentation

## 2022-11-26 DIAGNOSIS — C61 Malignant neoplasm of prostate: Secondary | ICD-10-CM | POA: Insufficient documentation

## 2022-11-26 DIAGNOSIS — Z510 Encounter for antineoplastic radiation therapy: Secondary | ICD-10-CM | POA: Diagnosis not present

## 2022-11-26 DIAGNOSIS — Z192 Hormone resistant malignancy status: Secondary | ICD-10-CM | POA: Diagnosis not present

## 2022-11-26 LAB — RAD ONC ARIA SESSION SUMMARY
Course Elapsed Days: 12
Plan Fractions Treated to Date: 8
Plan Prescribed Dose Per Fraction: 3 Gy
Plan Total Fractions Prescribed: 10
Plan Total Prescribed Dose: 30 Gy
Reference Point Dosage Given to Date: 24 Gy
Reference Point Session Dosage Given: 3 Gy
Session Number: 8

## 2022-11-27 ENCOUNTER — Other Ambulatory Visit: Payer: Self-pay

## 2022-11-27 ENCOUNTER — Ambulatory Visit
Admission: RE | Admit: 2022-11-27 | Discharge: 2022-11-27 | Disposition: A | Discharge status: Home / Self Care | Payer: PPO | Source: Ambulatory Visit | Attending: Radiation Oncology | Admitting: Radiation Oncology

## 2022-11-27 ENCOUNTER — Encounter: Payer: Self-pay | Admitting: *Deleted

## 2022-11-27 ENCOUNTER — Ambulatory Visit: Payer: PPO

## 2022-11-27 DIAGNOSIS — C61 Malignant neoplasm of prostate: Secondary | ICD-10-CM | POA: Diagnosis not present

## 2022-11-27 DIAGNOSIS — Z192 Hormone resistant malignancy status: Secondary | ICD-10-CM | POA: Diagnosis not present

## 2022-11-27 DIAGNOSIS — C7951 Secondary malignant neoplasm of bone: Secondary | ICD-10-CM | POA: Diagnosis not present

## 2022-11-27 DIAGNOSIS — Z510 Encounter for antineoplastic radiation therapy: Secondary | ICD-10-CM | POA: Diagnosis not present

## 2022-11-27 LAB — RAD ONC ARIA SESSION SUMMARY
Course Elapsed Days: 13
Plan Fractions Treated to Date: 9
Plan Prescribed Dose Per Fraction: 3 Gy
Plan Total Fractions Prescribed: 10
Plan Total Prescribed Dose: 30 Gy
Reference Point Dosage Given to Date: 27 Gy
Reference Point Session Dosage Given: 3 Gy
Session Number: 9

## 2022-11-27 NOTE — Progress Notes (Signed)
[facility] Quality Team Note  Name: Wesley White Date of Birth: [birth year] MRN: [id number] Date: 11/27/2022  [facility] Quality Team has reviewed this patient's chart, please see recommendations below:  [facility] Quality Other; Pt has open gaps for colon screening and A1C.  Called pt.  He declined both for now.  He is focusing on his cancer treatments right now.  Pt may discuss when he has ov 05/16/2023.  Routing as FYI.

## 2022-11-28 ENCOUNTER — Ambulatory Visit
Admission: RE | Admit: 2022-11-28 | Discharge: 2022-11-28 | Disposition: A | Discharge status: Home / Self Care | Payer: PPO | Source: Ambulatory Visit | Attending: Radiation Oncology | Admitting: Radiation Oncology

## 2022-11-28 ENCOUNTER — Other Ambulatory Visit: Payer: Self-pay

## 2022-11-28 DIAGNOSIS — C7951 Secondary malignant neoplasm of bone: Secondary | ICD-10-CM | POA: Diagnosis not present

## 2022-11-28 DIAGNOSIS — Z192 Hormone resistant malignancy status: Secondary | ICD-10-CM | POA: Diagnosis not present

## 2022-11-28 DIAGNOSIS — C61 Malignant neoplasm of prostate: Secondary | ICD-10-CM

## 2022-11-28 DIAGNOSIS — Z510 Encounter for antineoplastic radiation therapy: Secondary | ICD-10-CM | POA: Diagnosis not present

## 2022-11-28 LAB — RAD ONC ARIA SESSION SUMMARY
Course Elapsed Days: 14
Plan Fractions Treated to Date: 10
Plan Prescribed Dose Per Fraction: 3 Gy
Plan Total Fractions Prescribed: 10
Plan Total Prescribed Dose: 30 Gy
Reference Point Dosage Given to Date: 30 Gy
Reference Point Session Dosage Given: 3 Gy
Session Number: 10

## 2022-11-30 NOTE — Radiation Completion Notes (Addendum)
Radiation Oncology         [phone] ________________________________  Name: Wesley White MRN: [id number]  Date: 11/28/2022  DOB: [birth year]  Referring Physician: Sharlot Gowda, M.D. Date of Service: 2022-11-30 Radiation Oncologist: Margaretmary Bayley, M.D. [facility] [place]     RADIATION ONCOLOGY END OF TREATMENT NOTE     Diagnosis: 72 yo man with progressive metastasis at previously treated T8 level and L-spine  Intent: Palliative     ==========DELIVERED PLANS==========  First Treatment Date: 2022-11-14 - Last Treatment Date: 2022-11-28   Plan Name: Spine_T8_SBRT Site: Thoracic Spine Technique: IMRT Mode: Photon Dose Per Fraction: 8 Gy Prescribed Dose (Delivered / Prescribed): 40 Gy / 40 Gy Prescribed Fxs (Delivered / Prescribed): 5 / 5   Plan Name: Spine_L Site: Lumbar Spine Technique: 3D Mode: Photon Dose Per Fraction: 3 Gy Prescribed Dose (Delivered / Prescribed): 30 Gy / 30 Gy Prescribed Fxs (Delivered / Prescribed): 10 / 10     ==========ON TREATMENT VISIT DATES========== 2022-11-16, 2022-11-16, 2022-11-16, 2022-11-19, 2022-11-21, 2022-11-23, 2022-11-23     See weekly On Treatment Notes in Epic for details. He tolerated the treatments well with only modest fatigue. The patient will receive a call in about one month from the radiation oncology department. He will continue follow up with Dr. Truett Perna as well.  ------------------------------------------------   Margaretmary Dys, MD [facility]  Radiation Oncology Direct Dial: [phone]  Fax: [phone] [email or web address]  Skype  LinkedIn

## 2022-12-11 ENCOUNTER — Other Ambulatory Visit (HOSPITAL_COMMUNITY): Payer: Self-pay

## 2022-12-14 ENCOUNTER — Ambulatory Visit (INDEPENDENT_AMBULATORY_CARE_PROVIDER_SITE_OTHER): Payer: PPO | Admitting: Medical

## 2022-12-14 VITALS — BP 110/70 | HR 92 | Temp 97.4°F | Resp 16 | Wt 212.2 lb

## 2022-12-14 DIAGNOSIS — C61 Malignant neoplasm of prostate: Secondary | ICD-10-CM | POA: Diagnosis not present

## 2022-12-14 DIAGNOSIS — R0602 Shortness of breath: Secondary | ICD-10-CM

## 2022-12-14 DIAGNOSIS — R5383 Other fatigue: Secondary | ICD-10-CM | POA: Diagnosis not present

## 2022-12-14 DIAGNOSIS — C7951 Secondary malignant neoplasm of bone: Secondary | ICD-10-CM | POA: Diagnosis not present

## 2022-12-14 DIAGNOSIS — E119 Type 2 diabetes mellitus without complications: Secondary | ICD-10-CM

## 2022-12-14 NOTE — Progress Notes (Signed)
Subjective:  Wesley White is a 72 y.o. male who presents for Chief Complaint  Patient presents with   Shortness of Breath    Had cancer radiation end July 3rd so SOB and fatigue is normal for him but today it lasted 2-3 hours this morning as usually only last 20-30. Has gotten better since this morning      Here for concerns about fatigue.  Here with wife.  He reports SOB and fatigue in general for a year or more, but progressively worse in recent weeks, and worse in last day or so.  He denies chest pain, no weight gain or LE edema.  No palpitations.    He has prostate cancer with metastasis to bone and spine.  Just finished 15 rounds of radiation in recent weeks ending the first week of July.  No recent URI symptoms.  Sleeps about 10 hours daily.    Lately though feels as thought he is panting for breath  It has been 90+ degrees this past 2 weeks, but he hasn't been outside much.    Has noticed some SOB even sitting on toilet or in shower, panting like at times.   Compliant with CPAP  No recent unusual sugar readings  No other aggravating or relieving factors.    No other c/o.   Past Medical History:  Diagnosis Date   Cancer of spine (HCC)    Chronic kidney disease    hx kidney stone year ago   Prediabetes    Prostate cancer (HCC)    Sleep apnea    Current Outpatient Medications on File Prior to Visit  Medication Sig Dispense Refill   Calcium Carbonate (CALCIUM 600 PO) Take 600 mg by mouth daily.     enzalutamide (XTANDI) 40 MG tablet TAKE 4 TABLETS (160 MG TOTAL) BY MOUTH DAILY. 120 tablet 2   ibuprofen (ADVIL) 600 MG tablet Take 600 mg by mouth every 6 (six) hours as needed.     INVOKANA 300 MG TABS tablet TAKE 1 TABLET BY MOUTH EVERY DAY BEFORE BREAKFAST 90 tablet 1   lisinopril (ZESTRIL) 5 MG tablet TAKE 1 TABLET BY MOUTH EVERY DAY 90 tablet 1   metFORMIN (GLUCOPHAGE) 850 MG tablet TAKE 1 TABLET BY MOUTH TWICE A DAY WITH FOOD 180 tablet 1   OneTouch Delica Lancets  33G MISC See admin instructions.     ONETOUCH VERIO test strip USE AS DIRECTED AS NEEDED 100 strip 11   rosuvastatin (CRESTOR) 40 MG tablet TAKE 1 TABLET BY MOUTH EVERY DAY 90 tablet 1   HYDROcodone-acetaminophen (NORCO) 5-325 MG tablet Take 1 tablet by mouth every 6 (six) hours as needed for moderate pain. Do not drive while taking hydrocodone (Patient not taking: Reported on 12/14/2022) 60 tablet 0   naproxen sodium (ALEVE) 220 MG tablet Take 220 mg by mouth daily as needed. (Patient not taking: Reported on 12/14/2022)     Current Facility-Administered Medications on File Prior to Visit  Medication Dose Route Frequency Provider Last Rate Last Admin   magnesium citrate solution 1 Bottle  1 Bottle Oral Once Heloise Purpura, MD       sodium phosphate (FLEET) 7-19 GM/118ML enema 1 enema  1 enema Rectal Once Heloise Purpura, MD        The following portions of the patient's history were reviewed and updated as appropriate: allergies, current medications, past family history, past medical history, past social history, past surgical history and problem list.  ROS Otherwise as in subjective above  Objective: BP 110/70   Pulse 92   Temp (!) 97.4 F (36.3 C)   Resp 16   Wt 212 lb 3.2 oz (96.3 kg)   SpO2 98%   BMI 31.34 kg/m   General appearance: alert, no distress, well developed, well nourished HEENT: normocephalic, sclerae anicteric, conjunctiva pink and moist, TMs pearly, nares patent, no discharge or erythema, pharynx normal Oral cavity: MMM, no lesions Neck: supple, no lymphadenopathy, no thyromegaly, no masses, no JVD or bruits Heart: RRR, normal S1, S2, no murmurs Lungs: CTA bilaterally, no wheezes, rhonchi, or rales Abdomen: +bs, soft, non tender, non distended, no masses, no hepatomegaly, no splenomegaly Pulses: 2+ radial pulses, 2+ pedal pulses, normal cap refill Ext: no edema  EKG normal except axis -10.    PFT normal    Assessment: Encounter Diagnoses  Name Primary?    SOB (shortness of breath) Yes   Other fatigue    Malignant neoplasm of prostate metastatic to bone [facility])    Controlled type 2 diabetes mellitus without complication, without long-term current use of insulin (HCC)      Plan: Discussed symptoms, possible causes.  More chronic in nature, but since symptoms a little worse today, will check some STAT labs. EKG reviewed without major worrisome findings, PFT normal  He is on androgen deprivation therapy given prostate cancer along with recent hot temperatures and radiation therapy can contribute to fatigue  Deconditioning is a likely component.    He seems to be doing ok with CPAP  Pending labs, consider chest xray.  If much worse over weekend, then go to emergency dept  Echo was seen today for shortness of breath.  Diagnoses and all orders for this visit:  SOB (shortness of breath) -     TSH -     CBC -     Basic metabolic panel -     EKG 12-Lead -     Spirometry with Graph -     Troponin T, STAT ([facility])  Other fatigue -     TSH -     CBC -     Basic metabolic panel -     EKG 12-Lead -     Troponin T, STAT ([facility])  Malignant neoplasm of prostate metastatic to bone (HCC) -     TSH -     CBC -     Basic metabolic panel -     EKG 12-Lead -     Troponin T, STAT ([facility])  Controlled type 2 diabetes mellitus without complication, without long-term current use of insulin (HCC) -     TSH -     CBC -     Basic metabolic panel -     EKG 12-Lead -     Troponin T, STAT ([facility])    Follow up: pending labs

## 2022-12-15 LAB — BASIC METABOLIC PANEL
BUN/Creatinine Ratio: 21 (ref 10–24)
BUN: 11 mg/dL (ref 8–27)
CO2: 21 mmol/L (ref 20–29)
Calcium: 9.3 mg/dL (ref 8.6–10.2)
Chloride: 96 mmol/L (ref 96–106)
Creatinine, Ser: 0.53 mg/dL — ABNORMAL LOW (ref 0.76–1.27)
Glucose: 115 mg/dL — ABNORMAL HIGH (ref 70–99)
Potassium: 4.6 mmol/L (ref 3.5–5.2)
Sodium: 133 mmol/L — ABNORMAL LOW (ref 134–144)
eGFR: 107 mL/min/{1.73_m2} (ref >59)

## 2022-12-15 LAB — CBC
Hematocrit: 39.7 % (ref 37.5–51.0)
Hemoglobin: 13.4 g/dL (ref 13.0–17.7)
MCH: 30 pg (ref 26.6–33.0)
MCHC: 33.8 g/dL (ref 31.5–35.7)
MCV: 89 fL (ref 79–97)
Platelets: 288 10*3/uL (ref 150–450)
RBC: 4.47 x10E6/uL (ref 4.14–5.80)
RDW: 14.4 % (ref 11.6–15.4)
WBC: 4.2 10*3/uL (ref 3.4–10.8)

## 2022-12-15 LAB — TROPONIN T: Troponin T (Highly Sensitive): 9 ng/L (ref 0–22)

## 2022-12-15 LAB — TSH: TSH: 2 u[IU]/mL (ref 0.450–4.500)

## 2022-12-16 ENCOUNTER — Other Ambulatory Visit: Payer: Self-pay | Admitting: Medical

## 2022-12-16 DIAGNOSIS — R0602 Shortness of breath: Secondary | ICD-10-CM

## 2022-12-16 DIAGNOSIS — R5383 Other fatigue: Secondary | ICD-10-CM

## 2022-12-16 IMAGING — CT CT CHEST-ABD-PELV W/ CM
3 of 5 series · 15 of 36 positions shown, 17 images · IV contrast (OMNIPAQUE)
Comparison: 11/01/2020

CLINICAL DATA: Prostate cancer restaging

* Tracking Code: BO *
EXAM:
CT CHEST, ABDOMEN, AND PELVIS WITH CONTRAST
TECHNIQUE: Multidetector CT imaging of the chest, abdomen and pelvis was
performed following the standard protocol during bolus
administration of intravenous contrast.

[Series 2: cap with · axial · 0.92mm/px · z∈[-650,-125]mm · 10 of 129 slices shown, 12 images]
[im 12/129  mediastinal]
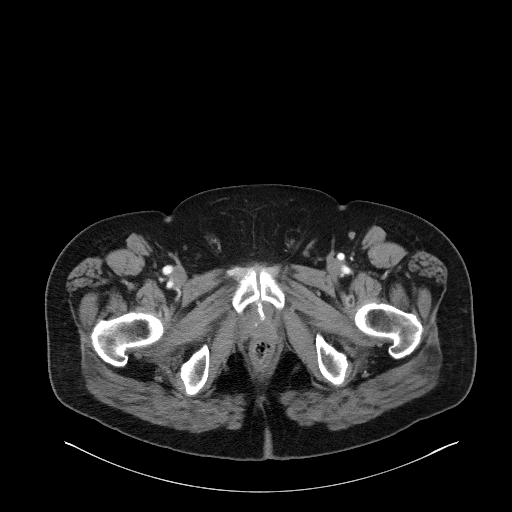
[im 12/129  bone]
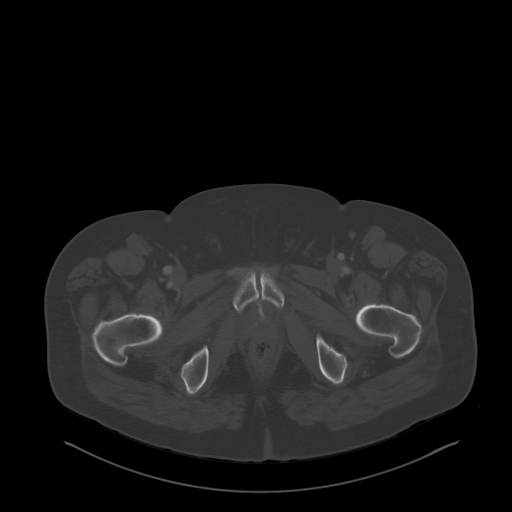
[im 24/129  mediastinal]
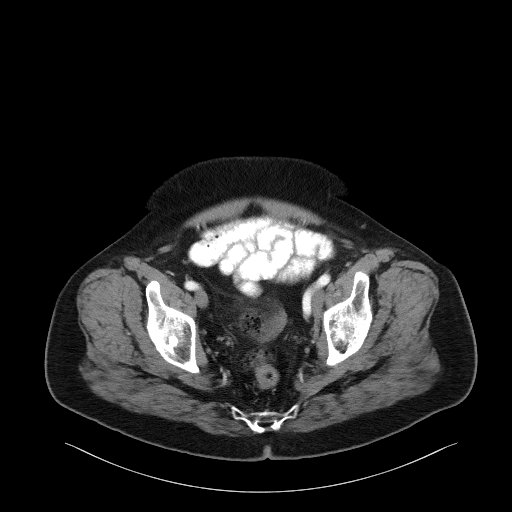
[im 35/129  mediastinal]
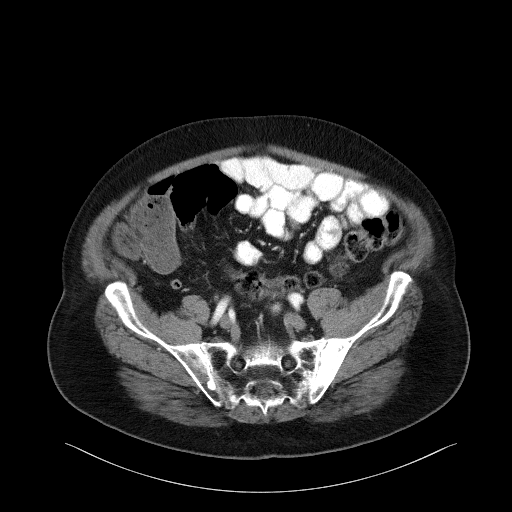
[im 47/129  mediastinal]
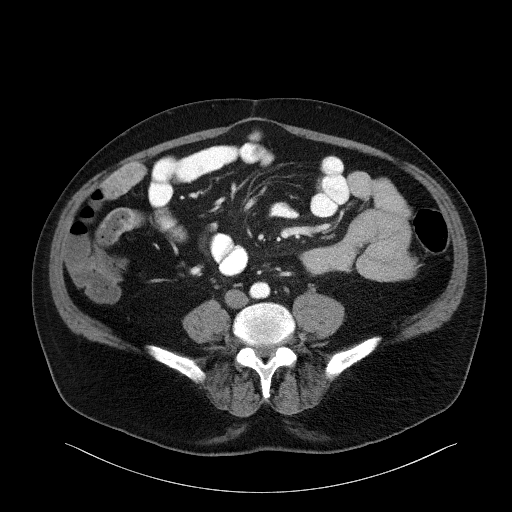
[im 59/129  mediastinal]
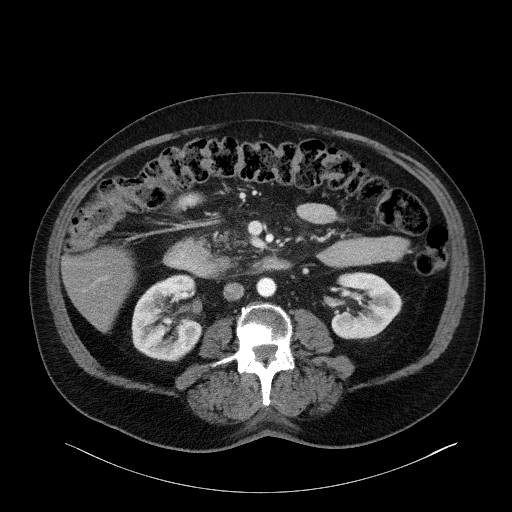
[im 70/129  mediastinal]
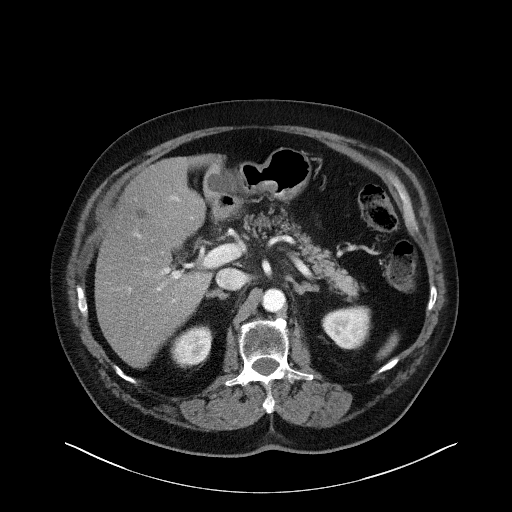
[im 82/129  mediastinal]
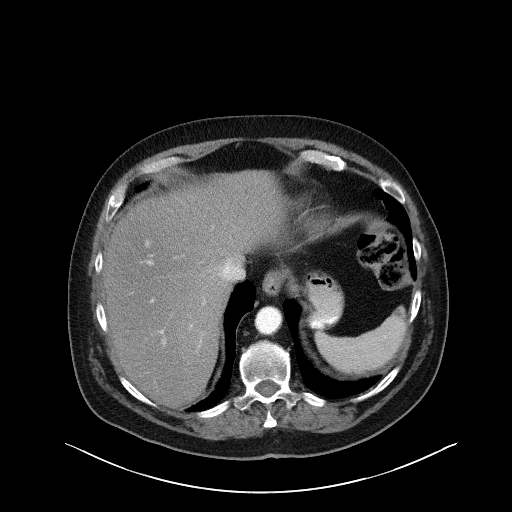
[im 94/129  mediastinal]
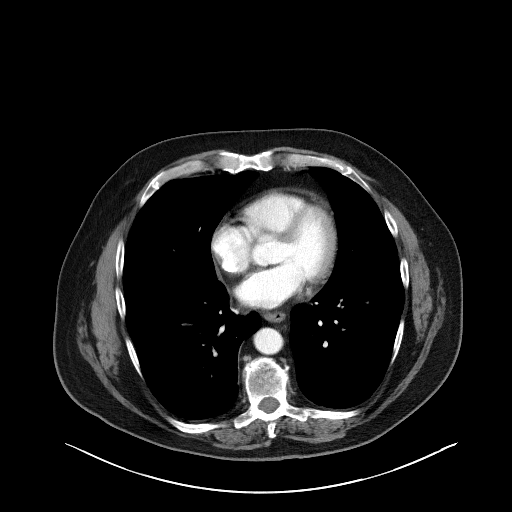
[im 105/129  mediastinal]
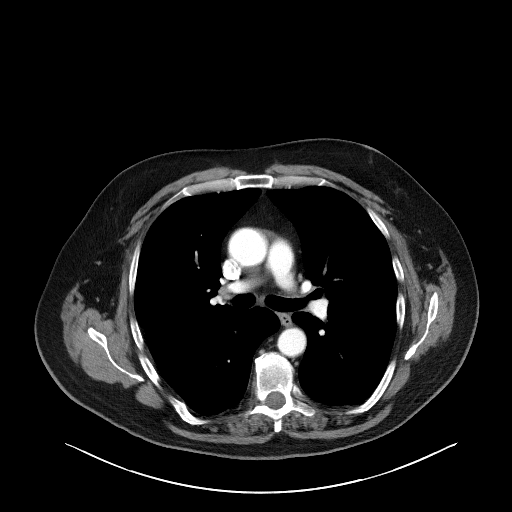
[im 105/129  bone]
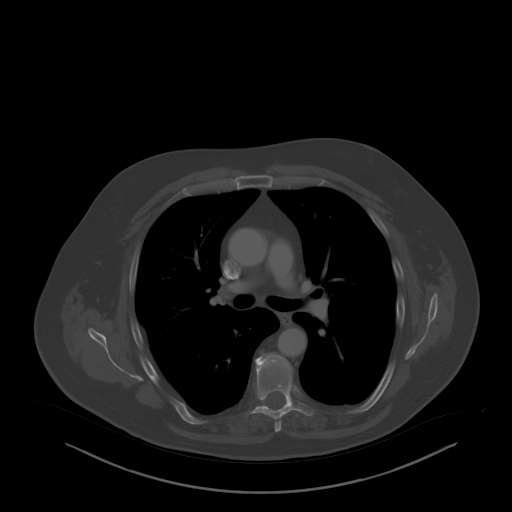
[im 117/129  mediastinal]
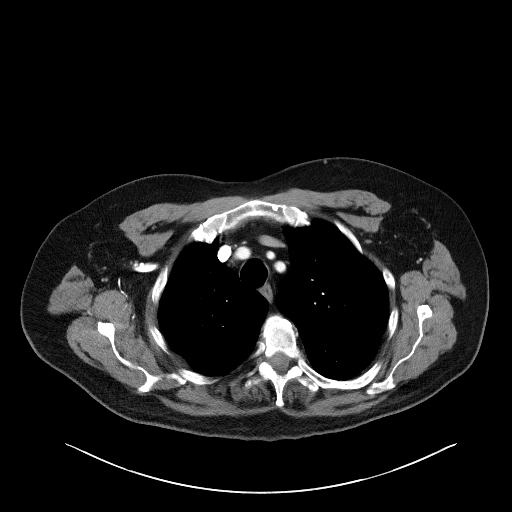

[Series 4: coronals · coronal · 0.90mm/px · 3 of 172 slices shown]
[im 35/172  mediastinal]
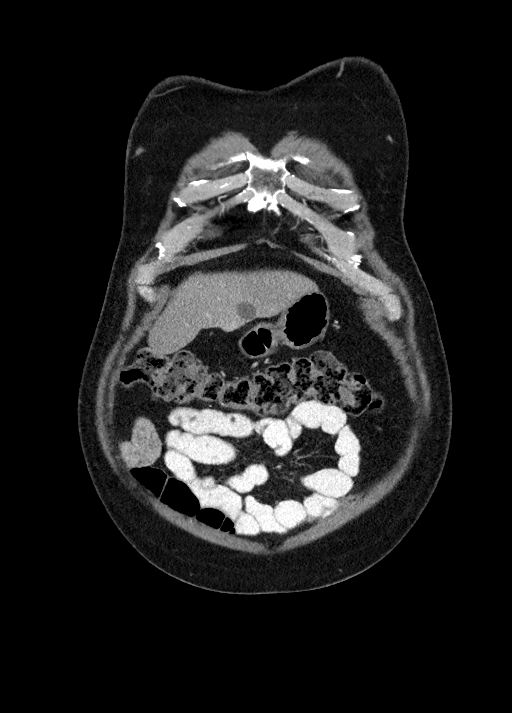
[im 69/172  mediastinal]
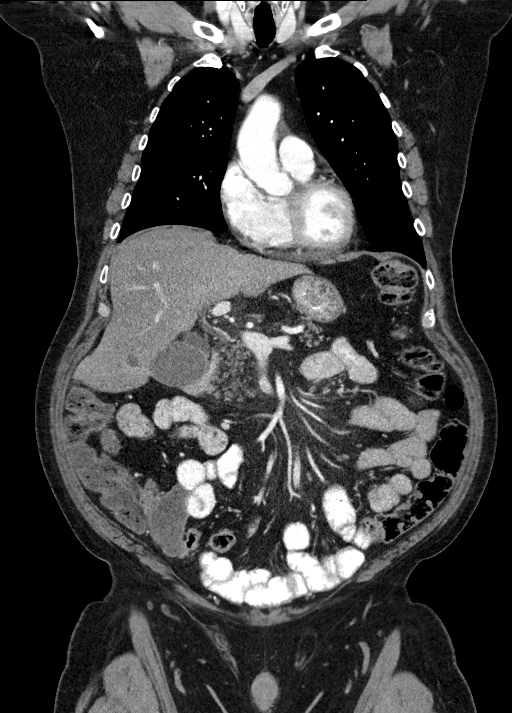
[im 103/172  mediastinal]
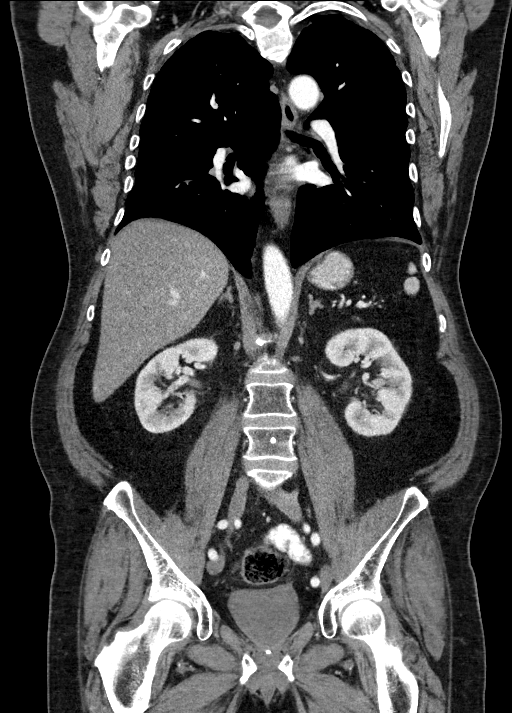

[Series 6: lung · axial · 0.82mm/px · z∈[-329,-281]mm · 2 of 145 slices shown]
[im 13/145  bone]
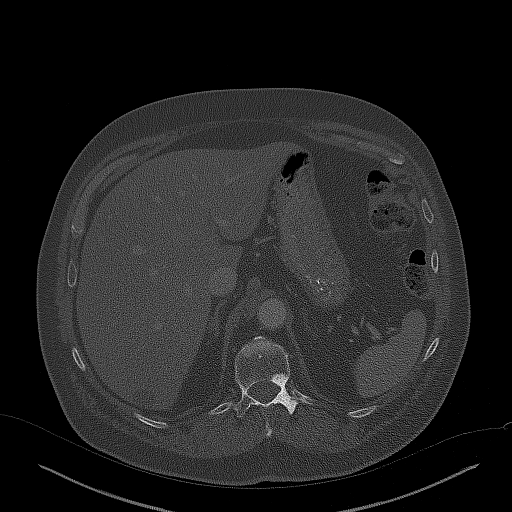
[im 37/145  bone]
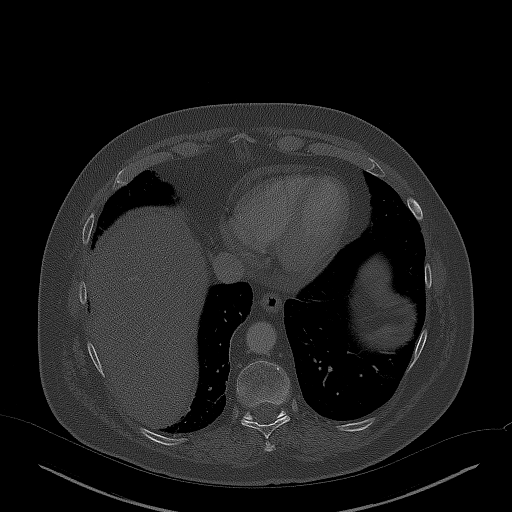

[15 of 36 positions shown; findings below may reference images not displayed]

RADIATION DOSE REDUCTION: This exam was performed according to the
departmental dose-optimization program which includes automated
exposure control, adjustment of the mA and/or kV according to
patient size and/or use of iterative reconstruction technique.

CONTRAST:  100mL OMNIPAQUE IOHEXOL 300 MG/ML  SOLN
FINDINGS: CT CHEST FINDINGS

Cardiovascular: Mild atherosclerotic calcification of the aortic
arch. Stable small pericardial effusion.

Mediastinum/Nodes: Unremarkable

Lungs/Pleura: Stable mild scarring in both lower lobes.

Musculoskeletal: Substantially increased sclerosis anteriorly in the
T8 vertebral body measuring 1.9 by 2.0 cm on image 124 series 5,
compatible with new or progressive metastatic lesion and
corresponding to finding on bone scan.

In general the remaining thoracic scattered sclerotic metastatic
lesions appear stable from 11/01/2020.

CT ABDOMEN PELVIS FINDINGS

Hepatobiliary: Stable hepatic cysts. Gallbladder unremarkable. No
new hepatic lesion. No biliary dilatation.

Pancreas: Unremarkable

Spleen: Unremarkable

Adrenals/Urinary Tract: Unremarkable

Stomach/Bowel: Unremarkable

Vascular/Lymphatic: Mild abdominal aortic atherosclerotic
calcification. No pathologic adenopathy identified.

Reproductive: Prostatectomy.

Other: There is some minimal stranding in the central mesentery,
with slight displacement of bowel around the central mesentery as on
prior exams, cannot exclude low-grade sclerosing mesenteritis.

Musculoskeletal: Stable sclerotic scattered lesions compatible with
prior metastatic disease in the lumbar spine and pelvis.
IMPRESSION: 1. New sclerosis anteriorly in the T8 vertebral body measuring 2 cm
in diameter compatible with new or progressive metastatic disease
compared to 11/01/2020. Otherwise scattered osseous lesions
compatible with prior metastatic disease appear stable.
2. No adenopathy or current findings of active extra osseous
malignancy.
3. Other imaging findings of potential clinical significance: Aortic
Atherosclerosis ([id number]). Stable small pericardial effusion.
Hepatic cysts. Possible low-grade sclerosing mesenteritis.
Prostatectomy.

## 2022-12-16 NOTE — Progress Notes (Signed)
Results sent through MyChart

## 2022-12-18 ENCOUNTER — Other Ambulatory Visit (HOSPITAL_COMMUNITY): Payer: Self-pay

## 2022-12-18 ENCOUNTER — Encounter: Payer: Self-pay | Admitting: Oncology

## 2022-12-19 ENCOUNTER — Ambulatory Visit
Admission: RE | Admit: 2022-12-19 | Discharge: 2022-12-19 | Disposition: A | Discharge status: Home / Self Care | Payer: PPO | Source: Ambulatory Visit | Attending: Medical | Admitting: Medical

## 2022-12-19 DIAGNOSIS — R5383 Other fatigue: Secondary | ICD-10-CM | POA: Diagnosis not present

## 2022-12-19 DIAGNOSIS — R0602 Shortness of breath: Secondary | ICD-10-CM

## 2022-12-24 ENCOUNTER — Inpatient Hospital Stay: Payer: PPO | Attending: Oncology

## 2022-12-24 DIAGNOSIS — Z923 Personal history of irradiation: Secondary | ICD-10-CM | POA: Insufficient documentation

## 2022-12-24 DIAGNOSIS — E119 Type 2 diabetes mellitus without complications: Secondary | ICD-10-CM | POA: Insufficient documentation

## 2022-12-24 DIAGNOSIS — C61 Malignant neoplasm of prostate: Secondary | ICD-10-CM | POA: Diagnosis not present

## 2022-12-24 DIAGNOSIS — C7951 Secondary malignant neoplasm of bone: Secondary | ICD-10-CM | POA: Insufficient documentation

## 2022-12-24 DIAGNOSIS — Z79899 Other long term (current) drug therapy: Secondary | ICD-10-CM | POA: Insufficient documentation

## 2022-12-24 LAB — CBC WITH DIFFERENTIAL (CANCER CENTER ONLY)
Abs Immature Granulocytes: 0.04 10*3/uL (ref 0.00–0.07)
Basophils Absolute: 0 10*3/uL (ref 0.0–0.1)
Basophils Relative: 1 %
Eosinophils Absolute: 0 10*3/uL (ref 0.0–0.5)
Eosinophils Relative: 1 %
HCT: 38.1 % — ABNORMAL LOW (ref 39.0–52.0)
Hemoglobin: 12.7 g/dL — ABNORMAL LOW (ref 13.0–17.0)
Immature Granulocytes: 1 %
Lymphocytes Relative: 9 %
Lymphs Abs: 0.3 10*3/uL — ABNORMAL LOW (ref 0.7–4.0)
MCH: 29.6 pg (ref 26.0–34.0)
MCHC: 33.3 g/dL (ref 30.0–36.0)
MCV: 88.8 fL (ref 80.0–100.0)
Monocytes Absolute: 0.4 10*3/uL (ref 0.1–1.0)
Monocytes Relative: 11 %
Neutro Abs: 2.6 10*3/uL (ref 1.7–7.7)
Neutrophils Relative %: 77 %
Platelet Count: 286 10*3/uL (ref 150–400)
RBC: 4.29 MIL/uL (ref 4.22–5.81)
RDW: 14.1 % (ref 11.5–15.5)
WBC Count: 3.4 10*3/uL — ABNORMAL LOW (ref 4.0–10.5)
nRBC: 0 % (ref 0.0–0.2)

## 2022-12-24 LAB — CMP (CANCER CENTER ONLY)
ALT: 15 U/L (ref 0–44)
AST: 17 U/L (ref 15–41)
Albumin: 4.1 g/dL (ref 3.5–5.0)
Alkaline Phosphatase: 335 U/L — ABNORMAL HIGH (ref 38–126)
Anion gap: 9 (ref 5–15)
BUN: 10 mg/dL (ref 8–23)
CO2: 25 mmol/L (ref 22–32)
Calcium: 8.9 mg/dL (ref 8.9–10.3)
Chloride: 98 mmol/L (ref 98–111)
Creatinine: 0.5 mg/dL — ABNORMAL LOW (ref 0.61–1.24)
GFR, Estimated: >60 mL/min (ref >60)
Glucose, Bld: 132 mg/dL — ABNORMAL HIGH (ref 70–99)
Potassium: 3.9 mmol/L (ref 3.5–5.1)
Sodium: 132 mmol/L — ABNORMAL LOW (ref 135–145)
Total Bilirubin: 0.5 mg/dL (ref 0.3–1.2)
Total Protein: 7.2 g/dL (ref 6.5–8.1)

## 2022-12-26 ENCOUNTER — Inpatient Hospital Stay: Payer: PPO

## 2022-12-26 ENCOUNTER — Inpatient Hospital Stay: Payer: PPO | Admitting: Oncology

## 2022-12-26 ENCOUNTER — Other Ambulatory Visit: Payer: Self-pay | Admitting: Internal Medicine

## 2022-12-26 VITALS — BP 115/82 | HR 102 | Temp 98.2°F | Resp 18 | Ht 69.0 in | Wt 205.3 lb

## 2022-12-26 DIAGNOSIS — C61 Malignant neoplasm of prostate: Secondary | ICD-10-CM | POA: Diagnosis not present

## 2022-12-26 DIAGNOSIS — C7951 Secondary malignant neoplasm of bone: Secondary | ICD-10-CM | POA: Diagnosis not present

## 2022-12-26 DIAGNOSIS — R5383 Other fatigue: Secondary | ICD-10-CM

## 2022-12-26 DIAGNOSIS — R0602 Shortness of breath: Secondary | ICD-10-CM

## 2022-12-26 NOTE — Progress Notes (Signed)
Xray shows no pneumonia, no fluid, but no changes or new findings in regards to metastases.  I recommend going further with an echocardiogram ultrasound of heart to evaluate heart function given the symptoms.   If agreeable, we can schedule or refer to cardiology if you prefer that instead.

## 2022-12-26 NOTE — Progress Notes (Unsigned)
  [facility] OFFICE PROGRESS NOTE   Diagnosis: Prostate cancer  INTERVAL HISTORY:   Wesley White returns as scheduled.  He completed a course of palliative radiation to the thoracic spine and lumbar spine on 11/28/2022.  He reports significant improvement in pain.  He had a diarrhea illness a few weeks ago.  This has resolved.  He has persistent discomfort at the sternum and left anterior chest wall.  Objective:  Vital signs in last 24 hours:  Blood pressure 115/82, pulse (!) 107, temperature 98.2 F (36.8 C), temperature source Oral, resp. rate 18, height 5\' 9"  (1.753 m), weight 205 lb 4.8 oz (93.1 kg), SpO2 100%.    Resp: Lungs clear bilaterally Cardio: Regular rate and rhythm GI: No hepatosplenomegaly Vascular: No leg edema   Lab Results:  Lab Results  Component Value Date   WBC 3.4 (L) 12/24/2022   HGB 12.7 (L) 12/24/2022   HCT 38.1 (L) 12/24/2022   MCV 88.8 12/24/2022   PLT 286 12/24/2022   NEUTROABS 2.6 12/24/2022    CMP  Lab Results  Component Value Date   NA 132 (L) 12/24/2022   K 3.9 12/24/2022   CL 98 12/24/2022   CO2 25 12/24/2022   GLUCOSE 132 (H) 12/24/2022   BUN 10 12/24/2022   CREATININE 0.50 (L) 12/24/2022   CALCIUM 8.9 12/24/2022   PROT 7.2 12/24/2022   ALBUMIN 4.1 12/24/2022   AST 17 12/24/2022   ALT 15 12/24/2022   ALKPHOS 335 (H) 12/24/2022   BILITOT 0.5 12/24/2022   GFRNONAA >60 12/24/2022   GFRAA >60 12/31/2019     Medications: I have reviewed the patient's current medications.   Assessment/Plan: Prostate cancer Prostatectomy and pelvic lymphadenectomy 03/05/2016-Gleason 4+3 equal 7, 0/9 nodes, extraprostatic extension,pT3b,pN0 Radiation to the prostatic bed completed 09/28/2016, 68.4 Gray in 38 fractions Recurrent disease June 2020, status post radiation to the isolated left iliac metastasis Lupron beginning August 2020  Zytiga August 2020-discontinued due to lack of insurance coverage Rising PSA with MRI confirming  progressive disease at multiple vertebrae Radiation to T11 and L4 completed 02/04/2020 30 Gray in 10 fractions Xtandi starting 01/20/2020 CTs 10/18/2021-new T8 metastasis and other stable bone metastases, no evidence of extraosseous metastases Bone scan 10/18/2021-new T8 metastasis, stable uptake in the left iliac bone Radiation to T8 metastasis September 2023, 30 Gray in 10 fractions Guardant360 04/14/2022- AR T878A, BRCA2 VUS MSI high not detected 09/13/2022-PSMA PET-multiple sclerotic bone metastases, medial left iliac lesion with an SUV of 5.5, sclerotic T8 lesion with an SUV of 7.4, other sclerotic lesions are similar to the 10/18/2021 CT, no evidence of extraosseous metastases, new small pericardial effusion Radiation, SBRT to T8 and standard fractionated radiation to the lumbar spine 11/14/2022 - 11/28/2022  2.  Diabetes      Disposition: Wesley White has metastatic prostate cancer.  The PSA is higher.  He completed a course of SBRT to T8 and standard radiation to the lumbar spine earlier this month.  Pain has significantly improved.  We discussed systemic treatment options.  He will discontinue Xtandi.  The plan is to continue Lupron and Xgeva.  We discussed docetaxel and Pluvicto.  We reviewed potential toxicities associated with docetaxel.  He would like to avoid chemotherapy if possible.  We will check into insurance approval for Pluvicto.  He will return for an office visit in 2 weeks.  Thornton Papas, MD  12/26/2022  10:20 AM

## 2022-12-27 ENCOUNTER — Encounter: Payer: Self-pay | Admitting: Oncology

## 2022-12-31 ENCOUNTER — Other Ambulatory Visit: Payer: Self-pay | Admitting: Oncology

## 2022-12-31 DIAGNOSIS — C7951 Secondary malignant neoplasm of bone: Secondary | ICD-10-CM

## 2023-01-02 DIAGNOSIS — G4733 Obstructive sleep apnea (adult) (pediatric): Secondary | ICD-10-CM | POA: Diagnosis not present

## 2023-01-07 ENCOUNTER — Inpatient Hospital Stay: Payer: PPO | Attending: Oncology

## 2023-01-07 DIAGNOSIS — Z79899 Other long term (current) drug therapy: Secondary | ICD-10-CM | POA: Insufficient documentation

## 2023-01-07 DIAGNOSIS — Z923 Personal history of irradiation: Secondary | ICD-10-CM | POA: Insufficient documentation

## 2023-01-07 DIAGNOSIS — C61 Malignant neoplasm of prostate: Secondary | ICD-10-CM | POA: Insufficient documentation

## 2023-01-07 DIAGNOSIS — C7951 Secondary malignant neoplasm of bone: Secondary | ICD-10-CM | POA: Insufficient documentation

## 2023-01-07 DIAGNOSIS — E119 Type 2 diabetes mellitus without complications: Secondary | ICD-10-CM | POA: Insufficient documentation

## 2023-01-07 DIAGNOSIS — R0781 Pleurodynia: Secondary | ICD-10-CM | POA: Diagnosis not present

## 2023-01-07 LAB — CMP (CANCER CENTER ONLY)
ALT: 17 U/L (ref 0–44)
AST: 19 U/L (ref 15–41)
Albumin: 3.9 g/dL (ref 3.5–5.0)
Alkaline Phosphatase: 656 U/L — ABNORMAL HIGH (ref 38–126)
Anion gap: 9 (ref 5–15)
BUN: 13 mg/dL (ref 8–23)
CO2: 25 mmol/L (ref 22–32)
Calcium: 9 mg/dL (ref 8.9–10.3)
Chloride: 99 mmol/L (ref 98–111)
Creatinine: 0.63 mg/dL (ref 0.61–1.24)
GFR, Estimated: >60 mL/min (ref >60)
Glucose, Bld: 131 mg/dL — ABNORMAL HIGH (ref 70–99)
Potassium: 4.6 mmol/L (ref 3.5–5.1)
Sodium: 133 mmol/L — ABNORMAL LOW (ref 135–145)
Total Bilirubin: 0.4 mg/dL (ref 0.3–1.2)
Total Protein: 6.8 g/dL (ref 6.5–8.1)

## 2023-01-07 LAB — CBC WITH DIFFERENTIAL (CANCER CENTER ONLY)
Abs Immature Granulocytes: 0.07 10*3/uL (ref 0.00–0.07)
Basophils Absolute: 0 10*3/uL (ref 0.0–0.1)
Basophils Relative: 1 %
Eosinophils Absolute: 0 10*3/uL (ref 0.0–0.5)
Eosinophils Relative: 1 %
HCT: 37.9 % — ABNORMAL LOW (ref 39.0–52.0)
Hemoglobin: 12.3 g/dL — ABNORMAL LOW (ref 13.0–17.0)
Immature Granulocytes: 2 %
Lymphocytes Relative: 11 %
Lymphs Abs: 0.4 10*3/uL — ABNORMAL LOW (ref 0.7–4.0)
MCH: 29.4 pg (ref 26.0–34.0)
MCHC: 32.5 g/dL (ref 30.0–36.0)
MCV: 90.5 fL (ref 80.0–100.0)
Monocytes Absolute: 0.5 10*3/uL (ref 0.1–1.0)
Monocytes Relative: 12 %
Neutro Abs: 3.1 10*3/uL (ref 1.7–7.7)
Neutrophils Relative %: 73 %
Platelet Count: 294 10*3/uL (ref 150–400)
RBC: 4.19 MIL/uL — ABNORMAL LOW (ref 4.22–5.81)
RDW: 15.1 % (ref 11.5–15.5)
WBC Count: 4.1 10*3/uL (ref 4.0–10.5)
nRBC: 0 % (ref 0.0–0.2)

## 2023-01-08 ENCOUNTER — Inpatient Hospital Stay: Payer: PPO | Admitting: Nurse Practitioner

## 2023-01-08 ENCOUNTER — Encounter: Payer: Self-pay | Admitting: Nurse Practitioner

## 2023-01-08 ENCOUNTER — Other Ambulatory Visit: Payer: Self-pay | Admitting: Nurse Practitioner

## 2023-01-08 VITALS — BP 114/81 | HR 100 | Temp 98.2°F | Resp 18 | Ht 69.0 in | Wt 204.0 lb

## 2023-01-08 DIAGNOSIS — C61 Malignant neoplasm of prostate: Secondary | ICD-10-CM

## 2023-01-08 DIAGNOSIS — C7951 Secondary malignant neoplasm of bone: Secondary | ICD-10-CM

## 2023-01-08 NOTE — Progress Notes (Signed)
  [facility] OFFICE PROGRESS NOTE   Diagnosis: Prostate cancer  INTERVAL HISTORY:   Wesley White returns as scheduled.  He continues Lupron and Xgeva, most recent 10/31/2022.  Pain is mainly located at the anterior lower ribs, controlled with ibuprofen and Tylenol.  No dental complaints.  Objective:  Vital signs in last 24 hours:  Blood pressure 114/81, pulse 100, temperature 98.2 F (36.8 C), temperature source Oral, resp. rate 18, height 5\' 9"  (1.753 m), weight 204 lb (92.5 kg), SpO2 98%.    HEENT: No thrush or ulcers. Resp: Lungs clear bilaterally. Cardio: Regular rate and rhythm. GI: No hepatosplenomegaly. Vascular: No leg edema.   Lab Results:  Lab Results  Component Value Date   WBC 4.1 01/07/2023   HGB 12.3 (L) 01/07/2023   HCT 37.9 (L) 01/07/2023   MCV 90.5 01/07/2023   PLT 294 01/07/2023   NEUTROABS 3.1 01/07/2023    Imaging:  No results found.  Medications: I have reviewed the patient's current medications.  Assessment/Plan: Prostate cancer Prostatectomy and pelvic lymphadenectomy 03/05/2016-Gleason 4+3 equal 7, 0/9 nodes, extraprostatic extension,pT3b,pN0 Radiation to the prostatic bed completed 09/28/2016, 68.4 Gray in 38 fractions Recurrent disease June 2020, status post radiation to the isolated left iliac metastasis Lupron beginning August 2020  Zytiga August 2020-discontinued due to lack of insurance coverage Rising PSA with MRI confirming progressive disease at multiple vertebrae Radiation to T11 and L4 completed 02/04/2020 30 Gray in 10 fractions Xtandi starting 01/20/2020 CTs 10/18/2021-new T8 metastasis and other stable bone metastases, no evidence of extraosseous metastases Bone scan 10/18/2021-new T8 metastasis, stable uptake in the left iliac bone Radiation to T8 metastasis September 2023, 30 Gray in 10 fractions Guardant360 04/14/2022- AR T878A, BRCA2 VUS MSI high not detected 09/13/2022-PSMA PET-multiple sclerotic bone metastases,  medial left iliac lesion with an SUV of 5.5, sclerotic T8 lesion with an SUV of 7.4, other sclerotic lesions are similar to the 10/18/2021 CT, no evidence of extraosseous metastases, new small pericardial effusion Radiation, SBRT to T8 and standard fractionated radiation to the lumbar spine 11/14/2022 - 11/28/2022 PSA 3.9 on 10/29/2022 Lupron and Xgeva 10/31/2022 PSA 42.3 on 12/24/2022, Xtandi discontinued Referred for Pluvicto 12/26/2022 PSA 74.4 on 01/07/2023   2.  Diabetes  Disposition: Wesley White appears unchanged.  Xtandi discontinued last office visit due to rise in the PSA.  He has been referred to nuclear medicine for consideration of Pluvicto.  We will follow-up on the status of the referral.  He will continue Lupron and Xgeva every 4 months, next due late September.  He will return for lab and follow-up in approximately 4 weeks.  We are available to see him sooner if needed.  Patient seen with Dr. Truett Perna.      Lonna Cobb ANP/GNP-BC   01/08/2023  1:19 PM This was a shared visit with Lonna Cobb.  Wesley White appears stable.  He has hormone refractory prostate cancer.  Diana Eves has been discontinued.  He will continue Lupron.  We will refer him for Pluvicto therapy.  He will continue bone building therapy.  We discussed additional systemic treatment options including docetaxel and other antiandrogen therapies.  I was present for greater than 50% of today's visit.  I performed Medical Decision Making.  Mancel Bale, MD

## 2023-01-09 ENCOUNTER — Other Ambulatory Visit: Payer: Self-pay

## 2023-01-09 ENCOUNTER — Other Ambulatory Visit (HOSPITAL_COMMUNITY): Payer: Self-pay

## 2023-01-10 ENCOUNTER — Encounter: Payer: Self-pay | Admitting: Family Medicine

## 2023-01-10 ENCOUNTER — Ambulatory Visit (INDEPENDENT_AMBULATORY_CARE_PROVIDER_SITE_OTHER): Payer: PPO | Admitting: Family Medicine

## 2023-01-10 VITALS — BP 110/70 | HR 100 | Temp 97.9°F | Resp 16 | Wt 205.0 lb

## 2023-01-10 DIAGNOSIS — R109 Unspecified abdominal pain: Secondary | ICD-10-CM | POA: Diagnosis not present

## 2023-01-10 DIAGNOSIS — Z87442 Personal history of urinary calculi: Secondary | ICD-10-CM | POA: Diagnosis not present

## 2023-01-10 LAB — POCT URINALYSIS DIP (CLINITEK)
Bilirubin, UA: NEGATIVE
Blood, UA: NEGATIVE
Glucose, UA: >=1000 mg/dL — AB
Leukocytes, UA: NEGATIVE
Nitrite, UA: NEGATIVE
POC PROTEIN,UA: NEGATIVE
Spec Grav, UA: 1.01 (ref 1.010–1.025)
Urobilinogen, UA: 0.2 E.U./dL
pH, UA: 6 (ref 5.0–8.0)

## 2023-01-10 NOTE — Addendum Note (Signed)
Addended by: Benjiman Core on: 01/10/2023 04:35 PM   Modules accepted: Orders

## 2023-01-10 NOTE — Progress Notes (Signed)
   Subjective:    Patient ID: Wesley White, male    DOB: 12-May-1951, 72 y.o.   MRN: [id number]  HPI He states that today he had the onset of right mid quadrant pain that he rated a 7 out of 10.  He has a history of kidney stones and states that when that usually occurs he sees blood in his urine.  He has no nausea, vomiting, diarrhea.  His last BMs were normal.   Review of Systems     Objective:    Physical Exam Alert and complaining of mid abdominal pain.  Bowel sounds were diminished.  No rebound tenderness.  Negative Murphy sign and Murphy's punch.  No abdominal masses were palpable.  Urine dipstick was negative.       Assessment & Plan:  Right lateral abdominal pain  History of kidney stones I explained that at this point I see no red flags.  No evidence of gallbladder disease, kidney stone, obstruction.  He does have codeine at home and I recommend he use this on an as-needed basis.  If his symptoms worsen explained that he is going to need to go to the hospital to look further into this.  He was comfortable with that.

## 2023-01-20 ENCOUNTER — Encounter (HOSPITAL_BASED_OUTPATIENT_CLINIC_OR_DEPARTMENT_OTHER): Payer: Self-pay | Admitting: Emergency Medicine

## 2023-01-20 ENCOUNTER — Emergency Department (HOSPITAL_BASED_OUTPATIENT_CLINIC_OR_DEPARTMENT_OTHER)
Admission: EM | Admit: 2023-01-20 | Discharge: 2023-01-20 | Disposition: A | Discharge status: Home / Self Care | Payer: PPO | Attending: Emergency Medicine | Admitting: Emergency Medicine

## 2023-01-20 ENCOUNTER — Emergency Department (HOSPITAL_BASED_OUTPATIENT_CLINIC_OR_DEPARTMENT_OTHER): Payer: PPO

## 2023-01-20 ENCOUNTER — Other Ambulatory Visit: Payer: Self-pay

## 2023-01-20 DIAGNOSIS — R1011 Right upper quadrant pain: Secondary | ICD-10-CM | POA: Insufficient documentation

## 2023-01-20 DIAGNOSIS — C7951 Secondary malignant neoplasm of bone: Secondary | ICD-10-CM | POA: Diagnosis not present

## 2023-01-20 DIAGNOSIS — Z8546 Personal history of malignant neoplasm of prostate: Secondary | ICD-10-CM | POA: Diagnosis not present

## 2023-01-20 DIAGNOSIS — N189 Chronic kidney disease, unspecified: Secondary | ICD-10-CM | POA: Diagnosis not present

## 2023-01-20 DIAGNOSIS — K7689 Other specified diseases of liver: Secondary | ICD-10-CM | POA: Diagnosis not present

## 2023-01-20 DIAGNOSIS — R109 Unspecified abdominal pain: Secondary | ICD-10-CM

## 2023-01-20 DIAGNOSIS — R1031 Right lower quadrant pain: Secondary | ICD-10-CM | POA: Insufficient documentation

## 2023-01-20 DIAGNOSIS — C61 Malignant neoplasm of prostate: Secondary | ICD-10-CM | POA: Diagnosis not present

## 2023-01-20 LAB — CBC
HCT: 37 % — ABNORMAL LOW (ref 39.0–52.0)
Hemoglobin: 12.1 g/dL — ABNORMAL LOW (ref 13.0–17.0)
MCH: 29 pg (ref 26.0–34.0)
MCHC: 32.7 g/dL (ref 30.0–36.0)
MCV: 88.7 fL (ref 80.0–100.0)
Platelets: 293 10*3/uL (ref 150–400)
RBC: 4.17 MIL/uL — ABNORMAL LOW (ref 4.22–5.81)
RDW: 15 % (ref 11.5–15.5)
WBC: 5.8 10*3/uL (ref 4.0–10.5)
nRBC: 0 % (ref 0.0–0.2)

## 2023-01-20 LAB — COMPREHENSIVE METABOLIC PANEL
ALT: 26 U/L (ref 0–44)
AST: 35 U/L (ref 15–41)
Albumin: 3.8 g/dL (ref 3.5–5.0)
Alkaline Phosphatase: 1482 U/L — ABNORMAL HIGH (ref 38–126)
Anion gap: 11 (ref 5–15)
BUN: 15 mg/dL (ref 8–23)
CO2: 25 mmol/L (ref 22–32)
Calcium: 9 mg/dL (ref 8.9–10.3)
Chloride: 98 mmol/L (ref 98–111)
Creatinine, Ser: 0.55 mg/dL — ABNORMAL LOW (ref 0.61–1.24)
GFR, Estimated: >60 mL/min (ref >60)
Glucose, Bld: 189 mg/dL — ABNORMAL HIGH (ref 70–99)
Potassium: 4.5 mmol/L (ref 3.5–5.1)
Sodium: 134 mmol/L — ABNORMAL LOW (ref 135–145)
Total Bilirubin: 0.3 mg/dL (ref 0.3–1.2)
Total Protein: 7.1 g/dL (ref 6.5–8.1)

## 2023-01-20 LAB — LIPASE, BLOOD: Lipase: 19 U/L (ref 11–51)

## 2023-01-20 MED ORDER — OXYCODONE-ACETAMINOPHEN 5-325 MG PO TABS
1.0000 | ORAL_TABLET | Freq: Four times a day (QID) | ORAL | 0 refills | Status: AC | PRN
Start: 2023-01-20 — End: ?

## 2023-01-20 MED ORDER — FENTANYL CITRATE PF 50 MCG/ML IJ SOSY
50.0000 ug | PREFILLED_SYRINGE | Freq: Once | INTRAMUSCULAR | Status: AC
  Administered 2023-01-20: 50 ug via INTRAVENOUS
  Filled 2023-01-20: qty 1

## 2023-01-20 MED ORDER — KETOROLAC TROMETHAMINE 15 MG/ML IJ SOLN
15.0000 mg | Freq: Once | INTRAMUSCULAR | Status: AC
  Administered 2023-01-20: 15 mg via INTRAVENOUS
  Filled 2023-01-20: qty 1

## 2023-01-20 NOTE — ED Triage Notes (Signed)
Right side abdo pain x 1 week Was seen by pcp and given hydrocodone for suspected kidney stone. Continued pain. Denies n/v/d

## 2023-01-20 NOTE — Discharge Instructions (Addendum)
Please take tylenol/ibuprofen or Percocet as needed for pain. I recommend close follow-up with PCP for reevaluation.  Please do not hesitate to return to emergency department if worrisome signs symptoms we discussed become apparent.

## 2023-01-20 NOTE — ED Provider Notes (Signed)
[place] EMERGENCY DEPARTMENT AT [facility] Provider Note   CSN: [id number] Arrival date & time: 01/20/23  1956     History {Add pertinent medical, surgical, social history, OB history to HPI:1} Chief Complaint  Patient presents with   Abdominal Pain    Wesley White is a 72 y.o. male history of prostate cancer with metastasized to spine, CKD, prediabetes presents today for evaluation of abdominal pain.  Symptom has been going on for about 10 days.  Pain is located in the right abdomen and right-sided flank.  Patient does have a history of kidney stones.  He denies any urinary symptoms today.  Denies fever, nausea or vomiting, chest pain, shortness of breath.  Denies any blood in his stool or urine.  Last bowel movement was earlier today, normal stool.   Abdominal Pain   Past Medical History:  Diagnosis Date   Cancer of spine (HCC)    Chronic kidney disease    hx kidney stone year ago   Prediabetes    Prostate cancer [facility])    Sleep apnea    Past Surgical History:  Procedure Laterality Date   HERNIA REPAIR     1983 baland    LITHOTRIPSY     2011 by tannenbaum   LYMPHADENECTOMY Bilateral 03/05/2016   Procedure: PELVIC LYMPHADENECTOMY;  Surgeon: Heloise Purpura, MD;  Location: [facility] ORS;  Service: Urology;  Laterality: Bilateral;   PROSTATE BIOPSY     ROBOT ASSISTED LAPAROSCOPIC RADICAL PROSTATECTOMY N/A 03/05/2016   Procedure: XI ROBOTIC ASSISTED LAPAROSCOPIC RADICAL PROSTATECTOMY LEVEL 2;  Surgeon: Heloise Purpura, MD;  Location: [facility] ORS;  Service: Urology;  Laterality: N/A;     Home Medications Prior to Admission medications   Medication Sig Start Date End Date Taking? Authorizing Provider  Calcium Carbonate (CALCIUM 600 PO) Take 600 mg by mouth daily.    [provider]  enzalutamide (XTANDI) 40 MG tablet TAKE 4 TABLETS (160 MG TOTAL) BY MOUTH DAILY. Patient not taking: Reported on 01/10/2023 10/17/22   Ladene Artist, MD  HYDROcodone-acetaminophen [facility])  5-325 MG tablet Take 1 tablet by mouth every 6 (six) hours as needed for moderate pain. Do not drive while taking hydrocodone 08/23/22   Ladene Artist, MD  ibuprofen (ADVIL) 600 MG tablet Take 600 mg by mouth every 6 (six) hours as needed.    [provider]  INVOKANA 300 MG TABS tablet TAKE 1 TABLET BY MOUTH EVERY DAY BEFORE BREAKFAST 09/10/22   Ronnald Nian, MD  lisinopril (ZESTRIL) 5 MG tablet TAKE 1 TABLET BY MOUTH EVERY DAY 09/10/22   Ronnald Nian, MD  metFORMIN (GLUCOPHAGE) 850 MG tablet TAKE 1 TABLET BY MOUTH TWICE A DAY WITH FOOD 11/24/22   Ronnald Nian, MD  OneTouch Delica Lancets 33G MISC See admin instructions. 11/05/18   [provider]  [place] VERIO test strip USE AS DIRECTED AS NEEDED 01/22/20   Ronnald Nian, MD  rosuvastatin (CRESTOR) 40 MG tablet TAKE 1 TABLET BY MOUTH EVERY DAY 07/30/22   Ronnald Nian, MD      Allergies    Patient has no known allergies.    Review of Systems   Review of Systems  Gastrointestinal:  Positive for abdominal pain.    Physical Exam Updated Vital Signs BP 121/84   Pulse 99   Temp 98.1 F (36.7 C) (Oral)   Resp 15   SpO2 93%  Physical Exam Vitals and nursing note reviewed.  Constitutional:      Appearance:  Normal appearance.  HENT:     Head: Normocephalic and atraumatic.     Mouth/Throat:     Mouth: Mucous membranes are moist.  Eyes:     General: No scleral icterus. Cardiovascular:     Rate and Rhythm: Normal rate and regular rhythm.     Pulses: Normal pulses.     Heart sounds: Normal heart sounds.  Pulmonary:     Effort: Pulmonary effort is normal.     Breath sounds: Normal breath sounds.  Abdominal:     General: Abdomen is flat.     Palpations: Abdomen is soft.     Tenderness: There is abdominal tenderness (right flank) in the right upper quadrant and right lower quadrant.  Musculoskeletal:        General: No deformity.  Skin:    General: Skin is warm.     Findings: No rash.  Neurological:      General: No focal deficit present.     Mental Status: He is alert.  Psychiatric:        Mood and Affect: Mood normal.     ED Results / Procedures / Treatments   Labs (all labs ordered are listed, but only abnormal results are displayed) Labs Reviewed  COMPREHENSIVE METABOLIC PANEL - Abnormal; Notable for the following components:      Result Value   Sodium 134 (*)    Glucose, Bld 189 (*)    Creatinine, Ser 0.55 (*)    Alkaline Phosphatase 1,482 (*)    All other components within normal limits  CBC - Abnormal; Notable for the following components:   RBC 4.17 (*)    Hemoglobin 12.1 (*)    HCT 37.0 (*)    All other components within normal limits  LIPASE, BLOOD  URINALYSIS, ROUTINE W REFLEX MICROSCOPIC    EKG None  Radiology No results found.  Procedures Procedures  {Document cardiac monitor, telemetry assessment procedure when appropriate:1}  Medications Ordered in ED Medications  ketorolac (TORADOL) 15 MG/ML injection 15 mg (15 mg Intravenous Given 01/20/23 2048)    ED Course/ Medical Decision Making/ A&P   {   Click here for ABCD2, HEART and other calculatorsREFRESH Note before signing :1}                              Medical Decision Making Amount and/or Complexity of Data Reviewed Labs: ordered. Radiology: ordered.  Risk Prescription drug management.   ***  {Document critical care time when appropriate:1} {Document review of labs and clinical decision tools ie heart score, Chads2Vasc2 etc:1}  {Document your independent review of radiology images, and any outside records:1} {Document your discussion with family members, caretakers, and with consultants:1} {Document social determinants of health affecting pt's care:1} {Document your decision making why or why not admission, treatments were needed:1} Final Clinical Impression(s) / ED Diagnoses Final diagnoses:  None    Rx / DC Orders ED Discharge Orders     None

## 2023-01-21 ENCOUNTER — Telehealth: Payer: Self-pay | Admitting: *Deleted

## 2023-01-21 ENCOUNTER — Other Ambulatory Visit: Payer: Self-pay | Admitting: Oncology

## 2023-01-21 MED ORDER — OXYCODONE-ACETAMINOPHEN 5-325 MG PO TABS: 1.0000 | ORAL_TABLET | ORAL | 0 refills | Status: DC | PRN

## 2023-01-21 NOTE — Telephone Encounter (Signed)
Wesley White reports going to ER this weekend w/severe abdominal pain. CT scan shows liver mass and asking if he should still keep appointment with nuclear med re: Pluvicto? Also was prescribed oxycodone-apap 5/325 to take every 6 hours. He said it works, but pain comes back in about 1.5 hours after dose. Asking for recommendations. Informed him to keep the nuclear med appointment per Dr. Truett Perna. If it is a liver mets, the Pluvicto will treat this as well.

## 2023-01-21 NOTE — Telephone Encounter (Signed)
Informed Wesley White that MD increased his oxy-apap to 1-2 every 4 hours and new script sent to [facility]. Will see him on 8/28 at 1:30 pm as well. Scheduling message sent.

## 2023-01-22 ENCOUNTER — Ambulatory Visit (HOSPITAL_COMMUNITY): Admission: RE | Admit: 2023-01-22 | Payer: PPO | Source: Ambulatory Visit

## 2023-01-22 ENCOUNTER — Telehealth: Payer: Self-pay

## 2023-01-23 ENCOUNTER — Ambulatory Visit: Payer: PPO | Admitting: Oncology

## 2023-01-23 ENCOUNTER — Inpatient Hospital Stay: Payer: PPO | Admitting: Oncology

## 2023-01-23 VITALS — BP 111/71 | HR 100 | Temp 98.1°F | Resp 18 | Ht 69.0 in | Wt 200.4 lb

## 2023-01-23 DIAGNOSIS — C61 Malignant neoplasm of prostate: Secondary | ICD-10-CM

## 2023-01-23 DIAGNOSIS — C7951 Secondary malignant neoplasm of bone: Secondary | ICD-10-CM | POA: Diagnosis not present

## 2023-01-23 NOTE — Progress Notes (Signed)
[facility] OFFICE PROGRESS NOTE   Diagnosis: Prostate cancer  INTERVAL HISTORY:   Wesley White returns prior to scheduled visit.  He was seen in the emergency room on 01/20/2023 with right abdomen and flank pain. A CT renal stone study revealed a 4.7 x 3.8 cm hypodense lesion in the posterior right liver suspicious for metastatic disease.  Ill-defined areas of hypodensity were noted in the dome of the liver.  Progression of metastases at the left iliac crest and T8. He was prescribed Percocet for pain.  He reports partial relief of pain for 1.5 hours after taking Percocet.  He has increased dyspnea for several weeks.  The right sided discomfort is worse with inspiration.  No leg swelling or pain. Objective:  Vital signs in last 24 hours:  Blood pressure 111/71, pulse 100, temperature 98.1 F (36.7 C), temperature source Oral, resp. rate 18, height 5\' 9"  (1.753 m), weight 200 lb 6.4 oz (90.9 kg), SpO2 96%.   Resp: Clear bilaterally, no respiratory distress Cardio: Regular rate and rhythm GI: No mass, no hepatosplenomegaly, tender at the right costal margin Vascular: No leg edema Musculoskeletal: Tender over the chest wall at the right posterior ribs, and right costal margin.  No mass.  No rash.   Lab Results:  Lab Results  Component Value Date   WBC 5.8 01/20/2023   HGB 12.1 (L) 01/20/2023   HCT 37.0 (L) 01/20/2023   MCV 88.7 01/20/2023   PLT 293 01/20/2023   NEUTROABS 3.1 01/07/2023    CMP  Lab Results  Component Value Date   NA 134 (L) 01/20/2023   K 4.5 01/20/2023   CL 98 01/20/2023   CO2 25 01/20/2023   GLUCOSE 189 (H) 01/20/2023   BUN 15 01/20/2023   CREATININE 0.55 (L) 01/20/2023   CALCIUM 9.0 01/20/2023   PROT 7.1 01/20/2023   ALBUMIN 3.8 01/20/2023   AST 35 01/20/2023   ALT 26 01/20/2023   ALKPHOS 1,482 (H) 01/20/2023   BILITOT 0.3 01/20/2023   GFRNONAA >60 01/20/2023   GFRAA >60 12/31/2019    No results found for: "CEA1", "CEA",  "ZOX096", "CA125"  No results found for: "INR", "LABPROT"  Imaging:  CT Renal Stone Study  Result Date: 01/20/2023 CLINICAL DATA:  Right-sided abdominal pain. Concern for kidney stone. Prostate cancer. EXAM: CT ABDOMEN AND PELVIS WITHOUT CONTRAST TECHNIQUE: Multidetector CT imaging of the abdomen and pelvis was performed following the standard protocol without IV contrast. RADIATION DOSE REDUCTION: This exam was performed according to the departmental dose-optimization program which includes automated exposure control, adjustment of the mA and/or kV according to patient size and/or use of iterative reconstruction technique. COMPARISON:  CT of the chest abdomen pelvis dated 10/18/2021. FINDINGS: Evaluation of this exam is limited in the absence of intravenous contrast. Lower chest: There are bibasilar subpleural atelectasis/scarring. Small pericardial effusion measures 8 mm in thickness. No intra-abdominal free air or free fluid. Hepatobiliary: Fatty liver. Several liver cysts as seen previously. There is a 4.7 x 3.8 cm hypodense lesion in the posterior right lobe of the liver (segment VII) suspicious for metastatic disease. Additional ill-defined areas of hypodensity in the dome of the liver also concerning for metastasis. Further evaluation with MRI recommended. No biliary dilatation. No calcified gallstone. There is adenomyomatosis of the gallbladder fundus. Pancreas: There is fatty infiltration of the pancreas. No active inflammatory changes. No dilatation of the main pancreatic duct. Spleen: Normal in size without focal abnormality. Adrenals/Urinary Tract: The adrenal glands are unremarkable. The kidneys,  visualized ureters, and urinary bladder appear unremarkable. Stomach/Bowel: There is stranding of the upper mesentery, nonspecific. An inflammatory process involving the duodenal C-loop is not excluded. Clinical correlation is recommended to evaluate for possibility of duodenitis. There is no bowel  obstruction. The appendix is normal. Vascular/Lymphatic: Minimal atherosclerotic calcification of the abdominal aorta. The IVC is unremarkable. No portal venous gas. There is no adenopathy. Reproductive: Prostatectomy. Other: None Musculoskeletal: Progression of osseous metastatic disease with enlargement of the lesions involving the left iliac crest and T8. No acute osseous pathology. IMPRESSION: 1. No hydronephrosis or nephrolithiasis. 2. Stranding of the upper mesentery, nonspecific. Clinical correlation is recommended to evaluate for possibility of duodenitis. 3. Progression of osseous metastatic disease. 4. Fatty liver. Ill-defined hypodense liver lesions concerning for metastatic disease. Further evaluation with MRI recommended. 5.  Aortic Atherosclerosis (ICD10-I70.0). Electronically Signed   By: Elgie Collard M.D.   On: 01/20/2023 21:28    Medications: I have reviewed the patient's current medications.   Assessment/Plan: Prostate cancer Prostatectomy and pelvic lymphadenectomy 03/05/2016-Gleason 4+3 equal 7, 0/9 nodes, extraprostatic extension,pT3b,pN0 Radiation to the prostatic bed completed 09/28/2016, 68.4 Gray in 38 fractions Recurrent disease June 2020, status post radiation to the isolated left iliac metastasis Lupron beginning August 2020  Zytiga August 2020-discontinued due to lack of insurance coverage Rising PSA with MRI confirming progressive disease at multiple vertebrae Radiation to T11 and L4 completed 02/04/2020 30 Gray in 10 fractions Xtandi starting 01/20/2020 CTs 10/18/2021-new T8 metastasis and other stable bone metastases, no evidence of extraosseous metastases Bone scan 10/18/2021-new T8 metastasis, stable uptake in the left iliac bone Radiation to T8 metastasis September 2023, 30 Gray in 10 fractions Guardant360 04/14/2022- AR T878A, BRCA2 VUS MSI high not detected 09/13/2022-PSMA PET-multiple sclerotic bone metastases, medial left iliac lesion with an SUV of 5.5, sclerotic  T8 lesion with an SUV of 7.4, other sclerotic lesions are similar to the 10/18/2021 CT, no evidence of extraosseous metastases, new small pericardial effusion Radiation, SBRT to T8 and standard fractionated radiation to the lumbar spine 11/14/2022 - 11/28/2022 PSA 3.9 on 10/29/2022 Lupron and Xgeva 10/31/2022 PSA 42.3 on 12/24/2022, Xtandi discontinued Referred for Pluvicto 12/26/2022 PSA 74.4 on 01/07/2023 CT renal stone study 01/20/2023-ill-defined hypodense liver lesions concerning for metastases   2.  Diabetes 3.  Pain secondary to #1    Disposition: Wesley White has metastatic prostate cancer.  He presents today with increased pain at the right low posterior/anterior chest.  I suspect the pain is related to prostate cancer involving the ribs.  He may have an acute rib fracture.  I have a low clinical suspicion for a pulmonary embolism. I reviewed the CT images from 01/20/2023 with Wesley White and his wife.  The hypodense liver lesions do not clearly represent metastases.  There were no liver lesions on the PSMA study 09/13/2022.  We will refer him for an urgent MRI of the liver.  He will continue oxycodone for pain.  Scheduled to see Dr. Amil Amen to discuss Pluvicto therapy.  I will recommend docetaxel chemotherapy if the liver MRI is consistent with liver metastases.  Wesley White will return for an office visit as scheduled on 02/07/2023.  He will call in the interim as needed.  Thornton Papas, MD  01/23/2023  8:41 AM

## 2023-01-24 ENCOUNTER — Ambulatory Visit (HOSPITAL_COMMUNITY)
Admission: RE | Admit: 2023-01-24 | Discharge: 2023-01-24 | Disposition: A | Discharge status: Home / Self Care | Payer: PPO | Source: Ambulatory Visit | Attending: Nurse Practitioner | Admitting: Nurse Practitioner

## 2023-01-24 DIAGNOSIS — C7951 Secondary malignant neoplasm of bone: Secondary | ICD-10-CM | POA: Diagnosis not present

## 2023-01-24 DIAGNOSIS — K769 Liver disease, unspecified: Secondary | ICD-10-CM | POA: Diagnosis not present

## 2023-01-24 DIAGNOSIS — C61 Malignant neoplasm of prostate: Secondary | ICD-10-CM | POA: Insufficient documentation

## 2023-01-24 NOTE — Consult Note (Signed)
Chief Complaint: Wesley White resistant prostate carcinoma with bone metastasis.  Increasing PSA levels.   Referring Physician(s):Sherrill    Patient Status: [facility] - Out-pt  History of Present Illness: Wesley White is a 72 y.o. male  with castrate resistant prostate carcinoma.  Diagnosis 2017 with robot assisted radical prostatectomy.  Recurrence of bone metastasis in 2020.  Several focal treatments of thoracic spine lesions with SB RT.  Patient currently on Lupron and Xgeva.  PSMA PET scan April 2004 demonstrate radiotracer avid skeletal metastasis.  More recently increase in PSA level.  Question of advanced bone disease on recent CT scan and liver lesion.   Past Medical History:  Diagnosis Date   Cancer of spine (HCC)    Chronic kidney disease    hx kidney stone year ago   Prediabetes    Prostate cancer [facility])    Sleep apnea     Past Surgical History:  Procedure Laterality Date   HERNIA REPAIR     1983 baland    LITHOTRIPSY     2011 by tannenbaum   LYMPHADENECTOMY Bilateral 03/05/2016   Procedure: PELVIC LYMPHADENECTOMY;  Surgeon: Heloise Purpura, MD;  Location: [facility];  Service: Urology;  Laterality: Bilateral;   PROSTATE BIOPSY     ROBOT ASSISTED LAPAROSCOPIC RADICAL PROSTATECTOMY N/A 03/05/2016   Procedure: XI ROBOTIC ASSISTED LAPAROSCOPIC RADICAL PROSTATECTOMY LEVEL 2;  Surgeon: Heloise Purpura, MD;  Location: [facility];  Service: Urology;  Laterality: N/A;    Allergies: Patient has no known allergies.  Medications: Prior to Admission medications   Medication Sig Start Date End Date Taking? Authorizing Provider  Calcium Carbonate (CALCIUM 600 PO) Take 600 mg by mouth daily.    [provider]  enzalutamide (XTANDI) 40 MG tablet TAKE 4 TABLETS (160 MG TOTAL) BY MOUTH DAILY. Patient not taking: Reported on 01/10/2023 10/17/22   Ladene Artist, MD  ibuprofen (ADVIL) 600 MG tablet Take 600 mg by mouth every 6 (six) hours as needed.    [provider]  INVOKANA  300 MG TABS tablet TAKE 1 TABLET BY MOUTH EVERY DAY BEFORE BREAKFAST 09/10/22   Ronnald Nian, MD  lisinopril (ZESTRIL) 5 MG tablet TAKE 1 TABLET BY MOUTH EVERY DAY 09/10/22   Ronnald Nian, MD  metFORMIN (GLUCOPHAGE) 850 MG tablet TAKE 1 TABLET BY MOUTH TWICE A DAY WITH FOOD 11/24/22   Ronnald Nian, MD  OneTouch Delica Lancets 33G MISC See admin instructions. 11/05/18   [provider]  [facility] VERIO test strip USE AS DIRECTED AS NEEDED 01/22/20   Ronnald Nian, MD  oxyCODONE-acetaminophen (PERCOCET/ROXICET) 5-325 MG tablet Take 1-2 tablets by mouth every 4 (four) hours as needed for severe pain. 01/21/23   Ladene Artist, MD  rosuvastatin (CRESTOR) 40 MG tablet TAKE 1 TABLET BY MOUTH EVERY DAY 07/30/22   Ronnald Nian, MD     Family History  Problem Relation Age of Onset   Cancer Father 75       Lymphoma   Cancer Brother 63       Prostate   Diabetes Mother    Hyperlipidemia Mother    Hypertension Mother    Bipolar disorder Daughter    Colon cancer Neg Hx    Esophageal cancer Neg Hx    Rectal cancer Neg Hx    Stomach cancer Neg Hx     Social History   Socioeconomic History   Marital status: Married    Spouse name: Not on file   Number of  children: Not on file   Years of education: Not on file   Highest education level: Not on file  Occupational History   Not on file  Tobacco Use   Smoking status: Never   Smokeless tobacco: Never  Vaping Use   Vaping status: Never Used  Substance and Sexual Activity   Alcohol use: Not Currently    Comment: rare   Drug use: No   Sexual activity: Yes  Other Topics Concern   Not on file  Social History Narrative   Not on file   Social Determinants of Health   Financial Resource Strain: Low Risk  (04/06/2022)   Overall Financial Resource Strain (CARDIA)    Difficulty of Paying Living Expenses: Not hard at all  Food Insecurity: No Food Insecurity (11/08/2022)   Hunger Vital Sign    Worried About Running Out of Food in  the Last Year: Never true    Ran Out of Food in the Last Year: Never true  Transportation Needs: No Transportation Needs (11/08/2022)   PRAPARE - Administrator, Civil Service (Medical): No    Lack of Transportation (Non-Medical): No  Physical Activity: Sufficiently Active (04/06/2022)   Exercise Vital Sign    Days of Exercise per Week: 5 days    Minutes of Exercise per Session: 30 min  Stress: No Stress Concern Present (04/06/2022)   [employer] of Occupational Health - Occupational Stress Questionnaire    Feeling of Stress : Not at all  Social Connections: Not on file    ECOG Status: 0 - Asymptomatic  Review of Systems: A 12 point ROS discussed and pertinent positives are indicated in the HPI above.  All other systems are negative.  Review of Systems  Genitourinary: Negative.        No incontinence  Musculoskeletal:        Right rib pain      Vital Signs: There were no vitals taken for this visit.  Physical Exam deferred Imaging: No results found.  Labs:  CBC: Recent Labs    12/14/22 0000 12/24/22 1006 01/07/23 0902 01/20/23 2009  WBC 4.2 3.4* 4.1 5.8  HGB 13.4 12.7* 12.3* 12.1*  HCT 39.7 38.1* 37.9* 37.0*  PLT 288 286 294 293    COAGS: No results for input(s): "INR", "APTT" in the last 8760 hours.  BMP: Recent Labs    10/29/22 0953 12/14/22 0000 12/24/22 1006 01/07/23 0902 01/20/23 2009  NA 136 133* 132* 133* 134*  K 5.0 4.6 3.9 4.6 4.5  CL 101 96 98 99 98  CO2 26 21 25 25 25   GLUCOSE 215* 115* 132* 131* 189*  BUN 14 11 10 13 15   CALCIUM 9.4 9.3 8.9 9.0 9.0  CREATININE 0.64 0.53* 0.50* 0.63 0.55*  GFRNONAA >60  --  >60 >60 >60    LIVER FUNCTION TESTS: Recent Labs    10/29/22 0953 12/24/22 1006 01/07/23 0902 01/20/23 2009  BILITOT 0.5 0.5 0.4 0.3  AST 12* 17 19 35  ALT 11 15 17 26   ALKPHOS 114 335* 656* 1,482*  PROT 7.0 7.2 6.8 7.1  ALBUMIN 4.2 4.1 3.9 3.8    TUMOR MARKERS: PSA = 74 increased from 42 one  month prior  Assessment and Plan: [Patient is adequate candidate Lu 177 PSMA therapy ( vipivotide tetraxetan).  Patient demonstrates mild-to-moderate progression metastatic [l skeletal disease] identified on recent PSMA PET  and CT scan.  Additionally patient's PSA is gradually increasing.  Patient has demonstrated progression on androgen  deprivation and SBRT.   Patient explained major and minor risks and benefits of therapy.  Major benefit being progression-free survival.  Major risk being myelosuppression and renal toxicity. Minor toxicity of xerostomia.  All the patient's questions were answered.  Patient accompanied by daughter who was also present for consult.    Patient is scheduled for 6 treatments spaced 6 weeks apart.  Recommend following up with oncologist for CBC and CMP 1 week prior to each treatment to assess safety of continuing with therapy.      Thank you for this interesting consult.  I greatly enjoyed meeting Audel Antoniewicz and look forward to participating in their care.  A copy of this report was sent to the requesting provider on this date.  Electronically Signed: Patriciaann Clan, MD 01/24/2023, 3:43 PM   I spent a total of  15 Minutes   in face to face in clinical consultation, greater than 50% of which was counseling/coordinating care for metastatic neuroendocrine tumor.

## 2023-01-24 NOTE — Discharge Instructions (Addendum)
Dr. Amil Amen consulted with the patient about Pluvicto treatment. He explained the therapy requirements and answered the patient's questions.

## 2023-01-25 ENCOUNTER — Other Ambulatory Visit: Payer: Self-pay | Admitting: Oncology

## 2023-01-25 ENCOUNTER — Ambulatory Visit (HOSPITAL_BASED_OUTPATIENT_CLINIC_OR_DEPARTMENT_OTHER)
Admission: RE | Admit: 2023-01-25 | Discharge: 2023-01-25 | Disposition: A | Discharge status: Home / Self Care | Payer: PPO | Source: Ambulatory Visit | Attending: Oncology | Admitting: Oncology

## 2023-01-25 DIAGNOSIS — C61 Malignant neoplasm of prostate: Secondary | ICD-10-CM | POA: Insufficient documentation

## 2023-01-25 DIAGNOSIS — C7951 Secondary malignant neoplasm of bone: Secondary | ICD-10-CM | POA: Diagnosis not present

## 2023-01-25 DIAGNOSIS — C787 Secondary malignant neoplasm of liver and intrahepatic bile duct: Secondary | ICD-10-CM | POA: Diagnosis not present

## 2023-01-25 MED ORDER — GADOBUTROL 1 MMOL/ML IV SOLN
9.0000 mL | Freq: Once | INTRAVENOUS | Status: AC | PRN
  Administered 2023-01-25: 9 mL via INTRAVENOUS
  Filled 2023-01-25: qty 10

## 2023-01-29 ENCOUNTER — Other Ambulatory Visit: Payer: Self-pay | Admitting: Family Medicine

## 2023-01-30 ENCOUNTER — Other Ambulatory Visit: Payer: Self-pay | Admitting: *Deleted

## 2023-01-30 ENCOUNTER — Inpatient Hospital Stay: Payer: PPO | Attending: Oncology | Admitting: Oncology

## 2023-01-30 ENCOUNTER — Encounter: Payer: Self-pay | Admitting: Oncology

## 2023-01-30 VITALS — BP 110/82 | HR 107 | Temp 98.1°F | Resp 18 | Ht 69.0 in | Wt 198.7 lb

## 2023-01-30 DIAGNOSIS — Z79633 Long term (current) use of mitotic inhibitor: Secondary | ICD-10-CM | POA: Insufficient documentation

## 2023-01-30 DIAGNOSIS — R0600 Dyspnea, unspecified: Secondary | ICD-10-CM | POA: Diagnosis not present

## 2023-01-30 DIAGNOSIS — Z923 Personal history of irradiation: Secondary | ICD-10-CM | POA: Diagnosis not present

## 2023-01-30 DIAGNOSIS — R112 Nausea with vomiting, unspecified: Secondary | ICD-10-CM | POA: Diagnosis not present

## 2023-01-30 DIAGNOSIS — C61 Malignant neoplasm of prostate: Secondary | ICD-10-CM | POA: Insufficient documentation

## 2023-01-30 DIAGNOSIS — G893 Neoplasm related pain (acute) (chronic): Secondary | ICD-10-CM | POA: Diagnosis not present

## 2023-01-30 DIAGNOSIS — E860 Dehydration: Secondary | ICD-10-CM | POA: Insufficient documentation

## 2023-01-30 DIAGNOSIS — E119 Type 2 diabetes mellitus without complications: Secondary | ICD-10-CM | POA: Insufficient documentation

## 2023-01-30 DIAGNOSIS — Z5111 Encounter for antineoplastic chemotherapy: Secondary | ICD-10-CM | POA: Diagnosis not present

## 2023-01-30 DIAGNOSIS — Z79899 Other long term (current) drug therapy: Secondary | ICD-10-CM | POA: Diagnosis not present

## 2023-01-30 DIAGNOSIS — C7951 Secondary malignant neoplasm of bone: Secondary | ICD-10-CM | POA: Diagnosis not present

## 2023-01-30 DIAGNOSIS — Z5189 Encounter for other specified aftercare: Secondary | ICD-10-CM | POA: Diagnosis not present

## 2023-01-30 DIAGNOSIS — C787 Secondary malignant neoplasm of liver and intrahepatic bile duct: Secondary | ICD-10-CM | POA: Diagnosis not present

## 2023-01-30 DIAGNOSIS — R630 Anorexia: Secondary | ICD-10-CM | POA: Insufficient documentation

## 2023-01-30 MED ORDER — PROCHLORPERAZINE MALEATE 10 MG PO TABS: 10.0000 mg | ORAL_TABLET | Freq: Four times a day (QID) | ORAL | 1 refills | Status: DC | PRN

## 2023-01-30 MED ORDER — PREDNISONE 5 MG PO TABS: 5.0000 mg | ORAL_TABLET | Freq: Two times a day (BID) | ORAL | 3 refills | Status: DC

## 2023-01-30 NOTE — Progress Notes (Signed)
Assessment of peripheral veins reveal his venous access is not optimal. Should be able to have 1st treatment peripheral, but will most likely need port in near future. Patient was informed of this as well.

## 2023-01-30 NOTE — Progress Notes (Signed)
[facility] OFFICE PROGRESS NOTE   Diagnosis: Prostate cancer  INTERVAL HISTORY:   Wesley White returns as scheduled.  Right subcostal discomfort has improved.  He continues to have dyspnea.  He saw Dr. Amil Amen on 01/24/2023 to consider Pluvicto therapy.  An MRI of the liver on 01/25/2023 revealed extensive hepatic metastases with no evidence of adenopathy in the abdomen.  Metastatic lesions are noted in the lower thoracic spine.  Dr. Amil Amen contacted me to discuss the indication for proceeding with Pluvicto versus beginning systemic chemotherapy.  Wesley White reports anorexia.   Objective:  Vital signs in last 24 hours:  Blood pressure 110/82, pulse (!) 112, temperature 98.1 F (36.7 C), temperature source Oral, resp. rate 18, height 5\' 9"  (1.753 m), weight 198 lb 11.2 oz (90.1 kg), SpO2 98%.    Resp: Decreased breath sounds at the right lower posterior chest, no respiratory distress Cardio: Regular rate and rhythm GI: Mild tenderness in the right subcostal region, no mass, no hepatosplenomegaly Vascular: No leg edema   Lab Results:  Lab Results  Component Value Date   WBC 5.8 01/20/2023   HGB 12.1 (L) 01/20/2023   HCT 37.0 (L) 01/20/2023   MCV 88.7 01/20/2023   PLT 293 01/20/2023   NEUTROABS 3.1 01/07/2023    CMP  Lab Results  Component Value Date   NA 134 (L) 01/20/2023   K 4.5 01/20/2023   CL 98 01/20/2023   CO2 25 01/20/2023   GLUCOSE 189 (H) 01/20/2023   BUN 15 01/20/2023   CREATININE 0.55 (L) 01/20/2023   CALCIUM 9.0 01/20/2023   PROT 7.1 01/20/2023   ALBUMIN 3.8 01/20/2023   AST 35 01/20/2023   ALT 26 01/20/2023   ALKPHOS 1,482 (H) 01/20/2023   BILITOT 0.3 01/20/2023   GFRNONAA >60 01/20/2023   GFRAA >60 12/31/2019    Medications: I have reviewed the patient's current medications.   Assessment/Plan: Prostate cancer Prostatectomy and pelvic lymphadenectomy 03/05/2016-Gleason 4+3 equal 7, 0/9 nodes, extraprostatic  extension,pT3b,pN0 Radiation to the prostatic bed completed 09/28/2016, 68.4 Gray in 38 fractions Recurrent disease June 2020, status post radiation to the isolated left iliac metastasis Lupron beginning August 2020  Zytiga August 2020-discontinued due to lack of insurance coverage Rising PSA with MRI confirming progressive disease at multiple vertebrae Radiation to T11 and L4 completed 02/04/2020 30 Gray in 10 fractions Xtandi starting 01/20/2020 CTs 10/18/2021-new T8 metastasis and other stable bone metastases, no evidence of extraosseous metastases Bone scan 10/18/2021-new T8 metastasis, stable uptake in the left iliac bone Radiation to T8 metastasis September 2023, 30 Gray in 10 fractions Guardant360 04/14/2022- AR T878A, BRCA2 VUS MSI high not detected 09/13/2022-PSMA PET-multiple sclerotic bone metastases, medial left iliac lesion with an SUV of 5.5, sclerotic T8 lesion with an SUV of 7.4, other sclerotic lesions are similar to the 10/18/2021 CT, no evidence of extraosseous metastases, new small pericardial effusion Radiation, SBRT to T8 and standard fractionated radiation to the lumbar spine 11/14/2022 - 11/28/2022 PSA 3.9 on 10/29/2022 Lupron and Xgeva 10/31/2022 PSA 42.3 on 12/24/2022, Xtandi discontinued Referred for Pluvicto 12/26/2022 PSA 74.4 on 01/07/2023 CT renal stone study 01/20/2023-ill-defined hypodense liver lesions concerning for metastases MRI liver 01/25/2023-bone metastases at the lower thoracic spine, widespread left and right hepatic metastases, no adenopathy   2.  Diabetes 3.  Pain secondary to #1    Disposition: Wesley White has metastatic hormone refractory prostate cancer.  An MRI liver 01/25/2023 is consistent with extensive liver metastases.  There was no evidence of metastatic  disease involving the liver on the PSMA scan in April. I discussed the case with Dr. Amil Amen.  We decided to place the plan for Pluvicto therapy on hold.  Wesley White is symptomatic with pain and  anorexia.  I recommend systemic therapy with docetaxel.  We discussed every 3-week versus every 2-week dosing of docetaxel.  He agrees to proceed with docetaxel/prednisone to be given on a 3-week schedule.  We reviewed potential toxicities associated with docetaxel including the chance of nausea/vomiting, mucositis, diarrhea, alopecia, hematologic toxicity, infection, and bleeding.  We discussed the potential for neuropathy, edema, and epiphora with docetaxel therapy.  Wesley White will receive G-CSF support following docetaxel.  We reviewed potential toxicities associated with G-CSF including the chance of bone pain, rash, and splenic rupture.  He will return for cycle 1 docetaxel on 02/04/2023.  He will schedule for a nadir CBC.  A chemotherapy plan was entered today.  Thornton Papas, MD  01/30/2023  3:31 PM

## 2023-01-30 NOTE — Progress Notes (Signed)
DISCONTINUE ON PATHWAY REGIMEN - Prostate     A cycle is every 12 weeks:     Leuprolide acetate depot        Dose Mod: None   Daily:     Bicalutamide        Dose Mod: None  **Always confirm dose/schedule in your pharmacy ordering system**  REASON: Disease Progression PRIOR TREATMENT: [id number]: Refer to Radiation Oncology +/- 6 Months ADT for High Risk Patients TREATMENT RESPONSE: Unable to Evaluate  START ON PATHWAY REGIMEN - Prostate     A cycle is every 21 days:     Prednisone      Docetaxel   **Always confirm dose/schedule in your pharmacy ordering system**  Patient Characteristics: Adenocarcinoma, Recurrent/New Systemic Disease (Including Biochemical Recurrence), Castration Resistant, M1, Prior Novel Hormonal Agent, No Molecular Alteration or Targeted Therapy Exhausted, No Prior Docetaxel Histology: Adenocarcinoma Therapeutic Status: Recurrent/New Systemic Disease (Including Biochemical Recurrence)  Intent of Therapy: Non-Curative / Palliative Intent, Discussed with Patient

## 2023-01-31 ENCOUNTER — Telehealth: Payer: Self-pay | Admitting: Family Medicine

## 2023-01-31 ENCOUNTER — Other Ambulatory Visit: Payer: Self-pay | Admitting: Oncology

## 2023-01-31 ENCOUNTER — Other Ambulatory Visit: Payer: Self-pay

## 2023-01-31 MED ORDER — OXYCODONE-ACETAMINOPHEN 5-325 MG PO TABS: 1.0000 | ORAL_TABLET | ORAL | 0 refills | Status: DC | PRN

## 2023-01-31 NOTE — Telephone Encounter (Signed)
Pt is going on prednisone twice a day for several months, wife wants to know what best time is to check his blood sugars while on the prednisone since it will have an effect on his readings

## 2023-01-31 NOTE — Telephone Encounter (Signed)
Wife wants to know best TIME EACH DAY to check blood sugars while on prednisone

## 2023-02-01 ENCOUNTER — Inpatient Hospital Stay: Payer: PPO

## 2023-02-01 VITALS — BP 106/73 | HR 115 | Temp 98.1°F | Resp 20

## 2023-02-01 DIAGNOSIS — C7951 Secondary malignant neoplasm of bone: Secondary | ICD-10-CM

## 2023-02-01 DIAGNOSIS — Z5111 Encounter for antineoplastic chemotherapy: Secondary | ICD-10-CM | POA: Diagnosis not present

## 2023-02-01 MED ORDER — ALBUTEROL SULFATE (2.5 MG/3ML) 0.083% IN NEBU: 2.5000 mg | INHALATION_SOLUTION | Freq: Once | RESPIRATORY_TRACT | Status: DC | PRN

## 2023-02-01 MED ORDER — DIPHENHYDRAMINE HCL 50 MG/ML IJ SOLN: 50.0000 mg | Freq: Once | INTRAMUSCULAR | Status: DC | PRN

## 2023-02-01 MED ORDER — METHYLPREDNISOLONE SODIUM SUCC 125 MG IJ SOLR: 125.0000 mg | Freq: Once | INTRAMUSCULAR | Status: DC | PRN

## 2023-02-01 MED ORDER — DENOSUMAB 120 MG/1.7ML ~~LOC~~ SOLN
120.0000 mg | Freq: Once | SUBCUTANEOUS | Status: AC
  Administered 2023-02-01: 120 mg via SUBCUTANEOUS
  Filled 2023-02-01: qty 1.7

## 2023-02-01 MED ORDER — EPINEPHRINE 1 MG/10ML IJ SOSY: 0.2500 mg | PREFILLED_SYRINGE | Freq: Once | INTRAMUSCULAR | Status: DC | PRN

## 2023-02-01 MED ORDER — SODIUM CHLORIDE 0.9 % IV SOLN: Freq: Once | INTRAVENOUS | Status: DC | PRN

## 2023-02-01 MED ORDER — DIPHENHYDRAMINE HCL 50 MG/ML IJ SOLN: 25.0000 mg | Freq: Once | INTRAMUSCULAR | Status: DC | PRN

## 2023-02-02 ENCOUNTER — Other Ambulatory Visit: Payer: Self-pay

## 2023-02-03 ENCOUNTER — Other Ambulatory Visit: Payer: Self-pay

## 2023-02-03 ENCOUNTER — Other Ambulatory Visit: Payer: Self-pay | Admitting: Oncology

## 2023-02-04 ENCOUNTER — Inpatient Hospital Stay: Payer: PPO

## 2023-02-04 ENCOUNTER — Inpatient Hospital Stay: Payer: PPO | Admitting: Nurse Practitioner

## 2023-02-04 ENCOUNTER — Encounter: Payer: Self-pay | Admitting: Oncology

## 2023-02-04 ENCOUNTER — Telehealth: Payer: Self-pay | Admitting: *Deleted

## 2023-02-04 DIAGNOSIS — C61 Malignant neoplasm of prostate: Secondary | ICD-10-CM | POA: Diagnosis not present

## 2023-02-04 DIAGNOSIS — C7951 Secondary malignant neoplasm of bone: Secondary | ICD-10-CM

## 2023-02-04 DIAGNOSIS — Z5111 Encounter for antineoplastic chemotherapy: Secondary | ICD-10-CM | POA: Diagnosis not present

## 2023-02-04 LAB — CBC WITH DIFFERENTIAL (CANCER CENTER ONLY)
Abs Immature Granulocytes: 0.41 10*3/uL — ABNORMAL HIGH (ref 0.00–0.07)
Basophils Absolute: 0 10*3/uL (ref 0.0–0.1)
Basophils Relative: 1 %
Eosinophils Absolute: 0 10*3/uL (ref 0.0–0.5)
Eosinophils Relative: 0 %
HCT: 37.5 % — ABNORMAL LOW (ref 39.0–52.0)
Hemoglobin: 12.1 g/dL — ABNORMAL LOW (ref 13.0–17.0)
Immature Granulocytes: 5 %
Lymphocytes Relative: 5 %
Lymphs Abs: 0.4 10*3/uL — ABNORMAL LOW (ref 0.7–4.0)
MCH: 28.7 pg (ref 26.0–34.0)
MCHC: 32.3 g/dL (ref 30.0–36.0)
MCV: 88.9 fL (ref 80.0–100.0)
Monocytes Absolute: 0.7 10*3/uL (ref 0.1–1.0)
Monocytes Relative: 9 %
Neutro Abs: 6.4 10*3/uL (ref 1.7–7.7)
Neutrophils Relative %: 80 %
Platelet Count: 337 10*3/uL (ref 150–400)
RBC: 4.22 MIL/uL (ref 4.22–5.81)
RDW: 15.9 % — ABNORMAL HIGH (ref 11.5–15.5)
WBC Count: 8.1 10*3/uL (ref 4.0–10.5)
nRBC: 0.4 % — ABNORMAL HIGH (ref 0.0–0.2)

## 2023-02-04 LAB — CMP (CANCER CENTER ONLY)
ALT: 68 U/L — ABNORMAL HIGH (ref 0–44)
AST: 119 U/L — ABNORMAL HIGH (ref 15–41)
Albumin: 4 g/dL (ref 3.5–5.0)
Alkaline Phosphatase: 3277 U/L — ABNORMAL HIGH (ref 38–126)
Anion gap: 16 — ABNORMAL HIGH (ref 5–15)
BUN: 18 mg/dL (ref 8–23)
CO2: 21 mmol/L — ABNORMAL LOW (ref 22–32)
Calcium: 9.2 mg/dL (ref 8.9–10.3)
Chloride: 92 mmol/L — ABNORMAL LOW (ref 98–111)
Creatinine: 0.68 mg/dL (ref 0.61–1.24)
GFR, Estimated: >60 mL/min (ref >60)
Glucose, Bld: 264 mg/dL — ABNORMAL HIGH (ref 70–99)
Potassium: 3.4 mmol/L — ABNORMAL LOW (ref 3.5–5.1)
Sodium: 129 mmol/L — ABNORMAL LOW (ref 135–145)
Total Bilirubin: 0.6 mg/dL (ref 0.3–1.2)
Total Protein: 8 g/dL (ref 6.5–8.1)

## 2023-02-04 MED ORDER — SODIUM CHLORIDE 0.9 % IV SOLN
12.5000 mg | Freq: Once | INTRAVENOUS | Status: AC
  Administered 2023-02-04: 12.5 mg via INTRAVENOUS
  Filled 2023-02-04: qty 0.5

## 2023-02-04 MED ORDER — SODIUM CHLORIDE 0.9 % IV SOLN: INTRAVENOUS | Status: DC

## 2023-02-04 MED ORDER — SODIUM CHLORIDE 0.9 % IV SOLN: Freq: Once | INTRAVENOUS | Status: AC

## 2023-02-04 NOTE — Patient Instructions (Signed)
Rehydration, Older Adult  Rehydration is the replacement of fluids, salts, and minerals in the body (electrolytes) that are lost during dehydration. Dehydration is when there is not enough water or other fluids in the body. This happens when you lose more fluids than you take in. People who are age 72 or older have a higher risk of dehydration than younger adults. This is because in older age, the body: Is less able to maintain the right amount of water. Does not respond to temperature changes as well. Does not get a sense of thirst as easily or quickly. Other causes include: Not drinking enough fluids. This can occur when you are ill, when you forget to drink, or when you are doing activities that require a lot of energy, especially in hot weather. Conditions that cause loss of water or other fluids. These include diarrhea, vomiting, sweating, or urinating a lot. Other illnesses, such as fever or infection. Certain medicines, such as those that remove excess fluid from the body (diuretics). Symptoms of mild or moderate dehydration may include thirst, dry lips and mouth, and dizziness. Symptoms of severe dehydration may include increased heart rate, confusion, fainting, and not urinating. In severe cases, you may need to get fluids through an IV at the hospital. For mild or moderate cases, you can usually rehydrate at home by drinking certain fluids as told by your health care provider. What are the risks? Rehydration is usually safe. Taking in too much fluid (overhydration) can be a problem but is rare. Overhydration can cause an imbalance of electrolytes in the body, kidney failure, fluid in the lungs, or a decrease in salt (sodium) levels in the body. Supplies needed: You will need an oral rehydration solution (ORS) if your health care provider tells you to use one. This is a drink to treat dehydration. It can be found in pharmacies and retail stores. How to rehydrate Fluids Follow  instructions from your health care provider about what to drink. The kind of fluid and the amount you should drink depend on your condition. In general, you should choose drinks that you prefer. If told by your health care provider, drink an ORS. Make an ORS by following instructions on the package. Start by drinking small amounts, about  cup (120 mL) every 5-10 minutes. Slowly increase how much you drink until you have taken in the amount recommended by your health care provider. Drink enough clear fluids to keep your urine pale yellow. If you were told to drink an ORS, finish it first, then start slowly drinking other clear fluids. Drink fluids such as: Water. This includes sparkling and flavored water. Drinking only water can lead to having too little sodium in your body (hyponatremia). Follow the advice of your health care provider. Water from ice chips you suck on. Fruit juice with water added to it(diluted). Sports drinks. Hot or cold herbal teas. Broth-based soups. Coffee. Milk or milk products. Food Follow instructions from your health care provider about what to eat while you rehydrate. Your health care provider may recommend that you slowly begin eating regular foods in small amounts. Eat foods that contain a healthy balance of electrolytes, such as bananas, oranges, potatoes, tomatoes, and spinach. Avoid foods that are greasy or contain a lot of sugar. In some cases, you may get nutrition through a feeding tube that is passed through your nose and into your stomach (nasogastric tube, or NG tube). This may be done if you have uncontrolled vomiting or diarrhea. Drinks to avoid  Certain drinks may make dehydration worse. While you rehydrate, avoid drinking alcohol. How to tell if you are recovering from dehydration You may be getting better if: You are urinating more often than before you started rehydrating. Your urine is pale yellow. Your energy level improves. You vomit less  often. You have diarrhea less often. Your appetite improves or returns to normal. You feel less dizzy or light-headed. Your skin tone and color start to look more normal. Follow these instructions at home: Take over-the-counter and prescription medicines only as told by your health care provider. Do not take sodium tablets. Doing this can lead to having too much sodium in your body (hypernatremia). Contact a health care provider if: You continue to have symptoms of mild or moderate dehydration, such as: Thirst. Dry lips. Slightly dry mouth. Dizziness. Dark urine or less urine than usual. Muscle cramps. You continue to vomit or have diarrhea. Get help right away if: You have symptoms of dehydration that get worse. You have a fever. You have a severe headache. You have been vomiting and have problems, such as: Your vomiting gets worse. Your vomit includes blood or green matter (bile). You cannot eat or drink without vomiting. You have problems with urination or bowel movements, such as: Diarrhea that gets worse. Blood in your stool (feces). This may cause stool to look black and tarry. Not urinating, or urinating only a small amount of very dark urine, within 6-8 hours. You have trouble breathing. You have symptoms that get worse with treatment. These symptoms may be an emergency. Get help right away. Call 911. Do not wait to see if the symptoms will go away. Do not drive yourself to the hospital. This information is not intended to replace advice given to you by your health care provider. Make sure you discuss any questions you have with your health care provider. Document Revised: 09/27/2021 Document Reviewed: 09/25/2021 Elsevier Patient Education  2024 Else[employer].  Promethazine Injection What is this medication? PROMETHAZINE (proe METH a zeen) prevents and treats the symptoms of an allergic reaction. It works by blocking histamine, a substance released by the body during  an allergic reaction. It may also help you relax, go to sleep, and relieve nausea, vomiting, or pain before or after procedures. It can also treat motion sickness. It works by helping your nervous system calm down by blocking substances in the body that may cause nausea and vomiting. It belongs to a group of medications called antihistamines. This medicine may be used for other purposes; ask your health care provider or pharmacist if you have questions. COMMON BRAND NAME(S): Anergan-50, Pentazine, Phenergan What should I tell my care team before I take this medication? They need to know if you have any of these conditions: Blockage in your bowels Diabetes Glaucoma Have trouble controlling your muscles Heart disease Liver disease Low blood cell levels (white cells, red cells, and platelets) Lung or breathing disease, such as asthma Parkinson disease Prostate disease Seizures Stomach or intestine problems Trouble passing urine An unusual or allergic reaction to promethazine, sulfites, other medications, foods, dyes, or preservatives Pregnant or trying to get pregnant Breastfeeding How should I use this medication? This medication is for injection into a muscle, or into a vein. It is given in a hospital or clinic setting. Talk to your care team about the use of this medication in children. This medication should not be given to infants and children younger than 72 years old. Overdosage: If you think you have taken  too much of this medicine contact a poison control center or emergency room at once. NOTE: This medicine is only for you. Do not share this medicine with others. What if I miss a dose? This does not apply. What may interact with this medication? Alcohol Antihistamines for allergy, cough, and cold Atropine Certain medications for anxiety or sleep Certain medications for bladder problems, such as oxybutynin or tolterodine Certain medications for depression, such as  amitriptyline, fluoxetine, sertraline Certain medications for Parkinson disease, such as benztropine or trihexyphenidyl Certain medications for seizures, such as phenobarbital, primidone, phenytoin Certain medications for stomach problems, such as dicyclomine, hyoscyamine Certain medications for travel sickness, such as scopolamine Epinephrine General anesthetics, such as halothane, isoflurane, methoxyflurane, propofol Ipratropium MAOIs, such as Marplan, Nardil, and Parnate Medications for blood pressure Medications that relax muscles for surgery Metoclopramide Opioids This list may not describe all possible interactions. Give your health care provider a list of all the medicines, herbs, non-prescription drugs, or dietary supplements you use. Also tell them if you smoke, drink alcohol, or use illegal drugs. Some items may interact with your medicine. What should I watch for while using this medication? Your condition will be monitored carefully while you are receiving this medication. Your care team will discuss with you the risks and the benefits of using this medication. This medication has caused serious side effects in some patients after it was injected into a vein. Watch closely for any signs or symptoms of a local reaction like burning, pain, redness, swelling, and blistering and tell your care team immediately if any occur. These symptoms may occur when you receive the injection or may occur hours or even days after the injection. This medication may affect your coordination, reaction time, or judgment. Do not drive or operate machinery until you know how this medication affects you. Sit up or stand slowly to reduce the risk of dizzy or fainting spells. Drinking alcohol with this medication can increase the risk of these side effects. Your mouth may get dry. Chewing sugarless gum or sucking hard candy and drinking plenty of water may help. This medication may cause dry eyes and blurred  vision. If you wear contact lenses, you may feel some discomfort. Lubricating eye drops may help. See your care team if the problem does not go away or is severe. This medication can make you more sensitive to the sun. Keep out of the sun. If you cannot avoid being in the sun, wear protective clothing and sunscreen. Do not use sun lamps, tanning beds, or tanning booths. This medication may increase blood sugar. The risk may be higher in patients who already have diabetes. Ask your care team what you can do to lower your risk of diabetes while taking this medication. What side effects may I notice from receiving this medication? Side effects that you should report to your care team as soon as possible: Allergic reactions--skin rash, itching, hives, swelling of the face, lips, tongue, or throat CNS depression--slow or shallow breathing, shortness of breath, feeling faint, dizziness, confusion, trouble staying awake High fever stiff muscles, increased sweating, fast or irregular heartbeat, and confusion, which may be signs of neuroleptic malignant syndrome Infection--fever, chills, cough, or sore throat Liver injury--right upper belly pain, loss of appetite, nausea, light-colored stool, dark yellow or brown urine, yellowing skin or eyes, unusual weakness or fatigue Pain, redness, or irritation at injection site Seizures Sudden eye pain or change in vision such as blurry vision, seeing halos around lights, vision loss Trouble  passing urine Uncontrolled and repetitive body movements, muscle stiffness or spasms, tremors or shaking, loss of balance or coordination, restlessness, shuffling walk, which may be signs of extrapyramidal symptoms (EPS) Side effects that usually do not require medical attention (report to your care team if they continue or are bothersome): Confusion Constipation Dizziness Drowsiness Dry mouth Sensitivity to light Vivid dreams or nightmares This list may not describe all  possible side effects. Call your doctor for medical advice about side effects. You may report side effects to FDA at 1-800-FDA-1088. Where should I keep my medication? This medication is given in a hospital or clinic and will not be stored at home. NOTE: This sheet is a summary. It may not cover all possible information. If you have questions about this medicine, talk to your doctor, pharmacist, or health care provider.  2024 Elsevier/Gold Standard (2021-11-25 00:00:00)

## 2023-02-04 NOTE — Progress Notes (Signed)
Vital signs reported to Roxan Diesel, NP at the completion on 1 L of NS. Order received.

## 2023-02-04 NOTE — Progress Notes (Signed)
[facility] OFFICE PROGRESS NOTE   Diagnosis: Prostate cancer  INTERVAL HISTORY:   Wesley White presented to the office today prior to the first cycle of docetaxel.  He woke up this morning very nauseated.  He vomited when he got to the office.  He feels weak.  Prior to the episode of nausea/vomiting earlier this morning he reports very mild intermittent nausea.  Fluid intake likely suboptimal.  Objective:  Vital signs in last 24 hours:  Temperature 97.4, heart rate 127, respirations 24, blood pressure 107/71; vital signs after 1 L of IV fluids temperature 98.1, heart rate 111, respirations 18, blood pressure 114/70    HEENT: Tongue appears dry. Resp: Lungs clear bilaterally. Cardio: Regular rate and rhythm. GI: Abdomen soft and nontender. Vascular: No leg edema. Skin: Decreased skin turgor.   Lab Results:  Lab Results  Component Value Date   WBC 8.1 02/04/2023   HGB 12.1 (L) 02/04/2023   HCT 37.5 (L) 02/04/2023   MCV 88.9 02/04/2023   PLT 337 02/04/2023   NEUTROABS 6.4 02/04/2023    Imaging:  No results found.  Medications: I have reviewed the patient's current medications.  Assessment/Plan: Prostate cancer Prostatectomy and pelvic lymphadenectomy 03/05/2016-Gleason 4+3 equal 7, 0/9 nodes, extraprostatic extension,pT3b,pN0 Radiation to the prostatic bed completed 09/28/2016, 68.4 Gray in 38 fractions Recurrent disease June 2020, status post radiation to the isolated left iliac metastasis Lupron beginning August 2020  Zytiga August 2020-discontinued due to lack of insurance coverage Rising PSA with MRI confirming progressive disease at multiple vertebrae Radiation to T11 and L4 completed 02/04/2020 30 Gray in 10 fractions Xtandi starting 01/20/2020 CTs 10/18/2021-new T8 metastasis and other stable bone metastases, no evidence of extraosseous metastases Bone scan 10/18/2021-new T8 metastasis, stable uptake in the left iliac bone Radiation to T8 metastasis  September 2023, 30 Gray in 10 fractions Guardant360 04/14/2022- AR T878A, BRCA2 VUS MSI high not detected 09/13/2022-PSMA PET-multiple sclerotic bone metastases, medial left iliac lesion with an SUV of 5.5, sclerotic T8 lesion with an SUV of 7.4, other sclerotic lesions are similar to the 10/18/2021 CT, no evidence of extraosseous metastases, new small pericardial effusion Radiation, SBRT to T8 and standard fractionated radiation to the lumbar spine 11/14/2022 - 11/28/2022 PSA 3.9 on 10/29/2022 Lupron and Xgeva 10/31/2022 PSA 42.3 on 12/24/2022, Xtandi discontinued Referred for Pluvicto 12/26/2022 PSA 74.4 on 01/07/2023 CT renal stone study 01/20/2023-ill-defined hypodense liver lesions concerning for metastases MRI liver 01/25/2023-bone metastases at the lower thoracic spine, widespread left and right hepatic metastases, no adenopathy Cycle 1 docetaxel planned 02/05/2023   2.  Diabetes 3.  Pain secondary to #1  Disposition: Wesley White presents to the office today prior to cycle 1 docetaxel.  He appeared dehydrated.  He received 1 L of IV fluids with improvement.  He will work on increasing fluid intake at home.  We are rescheduling the first cycle of docetaxel to 02/05/2023.  He will follow-up as scheduled.  Patient seen with Dr. Truett Perna.    Lonna Cobb ANP/GNP-BC   02/04/2023  1:12 PM This was a shared visit with Lonna Cobb.  [name] was interviewed and examined.  He is here today for planned cycle 1 docetaxel.  He developed nausea and vomiting upon arrival to the Cancer center.  He felt better after receiving intravenous fluids and Phenergan.  The liver enzymes are mildly elevated and the alkaline phosphatase is markedly elevated.  Review of the liver panel with the Cancer center pharmacy and clinical references gives a recommendation  to avoid Taxotere use.  The plan is to make a change to cabazitaxel.  I will place the care plan today with the plan to initiate cabazitaxel this week.  We will  review potential toxicities

## 2023-02-04 NOTE — Telephone Encounter (Signed)
Called [name] to report that Dr. Truett Perna and pharmacist discussed his case and with the high liver functions, it is best to change him from docetaxel to cabazitaxel. Informed her it has the same side effect profile and administration, but safer on his liver. Need to await pharmacy to order drug and managed care to obtain PA. She Hope to give on 9/11. She will be called tomorrow with specifics.

## 2023-02-04 NOTE — Progress Notes (Signed)
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

## 2023-02-05 ENCOUNTER — Inpatient Hospital Stay: Payer: PPO

## 2023-02-05 LAB — PROSTATE-SPECIFIC AG, SERUM (LABCORP): Prostate Specific Ag, Serum: 886 ng/mL — ABNORMAL HIGH (ref 0.0–4.0)

## 2023-02-06 ENCOUNTER — Inpatient Hospital Stay: Payer: PPO

## 2023-02-06 ENCOUNTER — Other Ambulatory Visit: Payer: Self-pay | Admitting: Nurse Practitioner

## 2023-02-06 VITALS — BP 121/76 | HR 105 | Temp 98.2°F | Resp 18

## 2023-02-06 DIAGNOSIS — Z5111 Encounter for antineoplastic chemotherapy: Secondary | ICD-10-CM | POA: Diagnosis not present

## 2023-02-06 DIAGNOSIS — C61 Malignant neoplasm of prostate: Secondary | ICD-10-CM

## 2023-02-06 MED ORDER — SODIUM CHLORIDE 0.9 % IV SOLN: Freq: Once | INTRAVENOUS | Status: AC

## 2023-02-06 MED ORDER — FAMOTIDINE IN NACL 20-0.9 MG/50ML-% IV SOLN
20.0000 mg | Freq: Once | INTRAVENOUS | Status: AC
  Administered 2023-02-06: 20 mg via INTRAVENOUS
  Filled 2023-02-06: qty 50

## 2023-02-06 MED ORDER — SODIUM CHLORIDE 0.9 % IV SOLN
10.0000 mg/m2 | Freq: Once | INTRAVENOUS | Status: AC
  Administered 2023-02-06: 21 mg via INTRAVENOUS
  Filled 2023-02-06: qty 2.1

## 2023-02-06 MED ORDER — DIPHENHYDRAMINE HCL 50 MG/ML IJ SOLN
25.0000 mg | Freq: Once | INTRAMUSCULAR | Status: AC
  Administered 2023-02-06: 25 mg via INTRAVENOUS
  Filled 2023-02-06: qty 1

## 2023-02-06 MED ORDER — SODIUM CHLORIDE 0.9 % IV SOLN
10.0000 mg | Freq: Once | INTRAVENOUS | Status: AC
  Administered 2023-02-06: 10 mg via INTRAVENOUS
  Filled 2023-02-06: qty 10

## 2023-02-06 NOTE — Progress Notes (Signed)
Stimufend not available through our wholesaler, switched to [employer]

## 2023-02-06 NOTE — Patient Instructions (Signed)
[facility]   Discharge Instructions: Thank you for choosing [facility] to provide your oncology and hematology care.   If you have a lab appointment with the Cancer Center, please go directly to the Cancer Center and check in at the registration area.   Wear comfortable clothing and clothing appropriate for easy access to any Portacath or PICC line.   We strive to give you quality time with your provider. You may need to reschedule your appointment if you arrive late (15 or more minutes).  Arriving late affects you and other patients whose appointments are after yours.  Also, if you miss three or more appointments without notifying the office, you may be dismissed from the clinic at the provider's discretion.      For prescription refill requests, have your pharmacy contact our office and allow 72 hours for refills to be completed.    Today you received the following chemotherapy and/or immunotherapy agents Cabazitaxel.      To help prevent nausea and vomiting after your treatment, we encourage you to take your nausea medication as directed.  BELOW ARE SYMPTOMS THAT SHOULD BE REPORTED IMMEDIATELY: *FEVER GREATER THAN 100.4 F (38 C) OR HIGHER *CHILLS OR SWEATING *NAUSEA AND VOMITING THAT IS NOT CONTROLLED WITH YOUR NAUSEA MEDICATION *UNUSUAL SHORTNESS OF BREATH *UNUSUAL BRUISING OR BLEEDING *URINARY PROBLEMS (pain or burning when urinating, or frequent urination) *BOWEL PROBLEMS (unusual diarrhea, constipation, pain near the anus) TENDERNESS IN MOUTH AND THROAT WITH OR WITHOUT PRESENCE OF ULCERS (sore throat, sores in mouth, or a toothache) UNUSUAL RASH, SWELLING OR PAIN  UNUSUAL VAGINAL DISCHARGE OR ITCHING   Items with * indicate a potential emergency and should be followed up as soon as possible or go to the Emergency Department if any problems should occur.  Please show the CHEMOTHERAPY ALERT CARD or IMMUNOTHERAPY ALERT CARD at  check-in to the Emergency Department and triage nurse.  Should you have questions after your visit or need to cancel or reschedule your appointment, please contact [facility]  Dept: [phone]  and follow the prompts.  Office hours are 8:00 a.m. to 4:30 p.m. Monday - Friday. Please note that voicemails left after 4:00 p.m. may not be returned until the following business day.  We are closed weekends and major holidays. You have access to a nurse at all times for urgent questions. Please call the main number to the clinic Dept: [phone] and follow the prompts.   For any non-urgent questions, you may also contact your provider using MyChart. We now offer e-Visits for anyone 62 and older to request care online for non-urgent symptoms. For details visit [email or web address].   Also download the MyChart app! Go to the app store, search "MyChart", open the app, select , and log in with your MyChart username and password.  Cabazitaxel Injection What is this medication? CABAZITAXEL (ka BAZ i TAX el) treats prostate cancer. It works by slowing down the growth of cancer cells. This medicine may be used for other purposes; ask your health care provider or pharmacist if you have questions. COMMON BRAND NAME(S): Jevtana What should I tell my care team before I take this medication? They need to know if you have any of these conditions: Kidney problems Liver disease Low white blood cell levels Lung disease Stomach or intestine problems An unusual or allergic reaction to cabazitaxel, polysorbate 80, other medications, foods, dyes, or preservatives Pregnant or trying to get pregnant  Breast-feeding How should I use this medication? This medication is injected into a vein. It is given by your care team in a hospital or clinic setting. Talk to your care team about the use of this medication in children. Special care may be needed. Overdosage: If you  think you have taken too much of this medicine contact a poison control center or emergency room at once. NOTE: This medicine is only for you. Do not share this medicine with others. What if I miss a dose? Keep appointments for follow-up doses. It is important not to miss your dose. Call your care team if you are unable to keep an appointment. What may interact with this medication? Certain antibiotics, such as clarithromycin or telithromycin Certain antivirals for HIV or AIDS Certain medications for fungal infections like ketoconazole, itraconazole, and voriconazole Nefazodone This list may not describe all possible interactions. Give your health care provider a list of all the medicines, herbs, non-prescription drugs, or dietary supplements you use. Also tell them if you smoke, drink alcohol, or use illegal drugs. Some items may interact with your medicine. What should I watch for while using this medication? This medication may make you feel generally unwell. This is not uncommon as chemotherapy can affect healthy cells as well as cancer cells. Report any side effects. Continue your course of treatment even though you feel ill unless your care team tells you to stop. You may need blood work while you are taking this medication. This medication may increase your risk of getting an infection. Call your care team for advice if you get a fever, chills, sore throat, or other symptoms of a cold or flu. Do not treat yourself. Try to avoid being around people who are sick. Avoid taking medications that contain aspirin, acetaminophen, ibuprofen, naproxen, or ketoprofen unless instructed by your care team. These medications may hide a fever. Be careful brushing or flossing your teeth or using a toothpick because you may get an infection or bleed more easily. If you have any dental work done, tell your dentist you are receiving this medication. This medication can cause serious infusion reactions. To reduce  the risk, your care team may give you other medications to take before receiving this one. Be sure to follow the directions from your care team. Use a condom during sex while taking this medication and for 4 months after the last dose. Talk to your care team right away if your partner may be pregnant. This medication can cause serious birth defects. This medication may cause infertility. Talk to your care team if you are concerned about your fertility. What side effects may I notice from receiving this medication? Side effects that you should report to your care team as soon as possible: Allergic reactions--skin rash, itching, hives, swelling of the face, lips, tongue, or throat Diarrhea, nausea, vomiting Dry cough, shortness of breath or trouble breathing Infection--fever, chills, cough, or sore throat Kidney injury--decrease in the amount of urine, swelling of the ankles, hands, or feet Pain, tingling, or numbness in the hands or feet Red or dark brown urine Stomach bleeding--bloody or black, tar-like stools, vomiting blood or brown material that looks like coffee grounds Stomach pain that is severe, does not away, or gets worse Unusual bruising or bleeding Side effects that usually do not require medical attention (report these to your care team if they continue or are bothersome): Loss of appetite Unusual weakness or fatigue This list may not describe all possible side effects. Call your  doctor for medical advice about side effects. You may report side effects to FDA at 1-800-FDA-1088. Where should I keep my medication? This medication is given in a hospital or clinic. It will not be stored at home. NOTE: This sheet is a summary. It may not cover all possible information. If you have questions about this medicine, talk to your doctor, pharmacist, or health care provider.  2024 Elsevier/Gold Standard (2022-06-05 00:00:00)

## 2023-02-07 ENCOUNTER — Telehealth: Payer: Self-pay | Admitting: Emergency Medicine

## 2023-02-07 ENCOUNTER — Inpatient Hospital Stay: Payer: PPO | Admitting: Oncology

## 2023-02-07 ENCOUNTER — Inpatient Hospital Stay: Payer: PPO

## 2023-02-07 VITALS — BP 109/71 | HR 103 | Temp 97.3°F

## 2023-02-07 DIAGNOSIS — C61 Malignant neoplasm of prostate: Secondary | ICD-10-CM

## 2023-02-07 DIAGNOSIS — Z5111 Encounter for antineoplastic chemotherapy: Secondary | ICD-10-CM | POA: Diagnosis not present

## 2023-02-07 MED ORDER — PEGFILGRASTIM-CBQV 6 MG/0.6ML ~~LOC~~ SOSY
6.0000 mg | PREFILLED_SYRINGE | Freq: Once | SUBCUTANEOUS | Status: AC
  Administered 2023-02-07: 6 mg via SUBCUTANEOUS
  Filled 2023-02-07: qty 0.6

## 2023-02-07 NOTE — Telephone Encounter (Signed)
24 Hour Callback 24 Hour Callback post 1st time Cabazitaxel infusion. Pt reports that he has had one episode of nausea but was able to take medication to avoid vomiting. Pt reports that today he was actually able to walk for the first time in several days. No other reports of problems. Patient had no questions or concerns but knows to call if he has any.

## 2023-02-07 NOTE — Patient Instructions (Signed)

## 2023-02-08 ENCOUNTER — Telehealth: Payer: Self-pay | Admitting: *Deleted

## 2023-02-08 ENCOUNTER — Encounter: Payer: Self-pay | Admitting: Oncology

## 2023-02-08 MED ORDER — LORATADINE 10 MG PO TABS: 10.0000 mg | ORAL_TABLET | Freq: Every day | ORAL | Status: DC

## 2023-02-08 NOTE — Telephone Encounter (Signed)
Mrs. Mcglory called to inquire if there were any restrictions with him being around a 58 month old baby and if baby has runny nose, but no fever? Informed her to have him avoid changing diapers for 2 weeks if child has had any live vaccine and to wash hands after he handles child and he is not the one to wipe child's nose. Avoid child if fever develops. She reports a couple episodes of feeling lightheaded and had to sit down. Asking if this is related to chemotherapy. His pulse can be rapid at times. He has a referral to cardiology pending per his PCP. He is eating and drinking. Glucose runs ~ 180. Informed her that this is not normal side effect of his chemo. Be sure he eats/drinks. Suggested she reach out to PCP to f/u on the cardiology referral again. Go to ER if things worsen over weekend or he has syncope episode.  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

## 2023-02-09 ENCOUNTER — Other Ambulatory Visit: Payer: Self-pay

## 2023-02-12 ENCOUNTER — Telehealth: Payer: Self-pay | Admitting: *Deleted

## 2023-02-12 NOTE — Telephone Encounter (Signed)
Mrs. Palin asking if he still needs lab on 9/19 and how to obtain cane or walker? Informed her that lab is not needed since the Pluvicto was canceled per Dr. Truett Perna. Suggested medical supply company in [place] for walker. We can order it through DME company, but needs to have a face-to-face encounter note for insurance to cover it. They will discuss and get back with nurse on decision on how to proceed.

## 2023-02-14 ENCOUNTER — Inpatient Hospital Stay: Payer: PPO

## 2023-02-18 ENCOUNTER — Telehealth: Payer: Self-pay | Admitting: *Deleted

## 2023-02-18 ENCOUNTER — Other Ambulatory Visit: Payer: Self-pay | Admitting: Nurse Practitioner

## 2023-02-18 DIAGNOSIS — C61 Malignant neoplasm of prostate: Secondary | ICD-10-CM

## 2023-02-18 MED ORDER — OXYCODONE-ACETAMINOPHEN 5-325 MG PO TABS: 1.0000 | ORAL_TABLET | ORAL | 0 refills | Status: DC | PRN

## 2023-02-18 NOTE — Telephone Encounter (Signed)
Mrs. Wesley White called to request a refill on his oxycodone/apap. He has been taking it every 4 hours ATC for his liver pain.

## 2023-02-20 ENCOUNTER — Telehealth: Payer: Self-pay | Admitting: *Deleted

## 2023-02-20 NOTE — Telephone Encounter (Signed)
Wife reports he awakened today w/nausea and vomited x 1 ~ 1/2 cup. Took a Zofran, but still nauseated (has not tried the compazine yet). Instructed her to try the compazine now and alternate the two antiemetics. Put him on clear liquids for now and work on getting his bowels to move. Last BM was 4 days ago-she has given him Senna today. She will call tomorrow if not better.

## 2023-02-21 ENCOUNTER — Inpatient Hospital Stay (HOSPITAL_COMMUNITY): Admission: RE | Admit: 2023-02-21 | Payer: PPO | Source: Ambulatory Visit

## 2023-02-23 ENCOUNTER — Other Ambulatory Visit: Payer: Self-pay | Admitting: Oncology

## 2023-02-24 ENCOUNTER — Inpatient Hospital Stay (HOSPITAL_COMMUNITY)
Admission: EM | Admit: 2023-02-24 | Discharge: 2023-02-26 | DRG: 951 | Disposition: E | Payer: PPO | Attending: Internal Medicine | Admitting: Internal Medicine

## 2023-02-24 ENCOUNTER — Other Ambulatory Visit: Payer: Self-pay

## 2023-02-24 ENCOUNTER — Emergency Department (HOSPITAL_COMMUNITY): Payer: PPO

## 2023-02-24 DIAGNOSIS — Z515 Encounter for palliative care: Principal | ICD-10-CM

## 2023-02-24 DIAGNOSIS — R404 Transient alteration of awareness: Secondary | ICD-10-CM | POA: Diagnosis not present

## 2023-02-24 DIAGNOSIS — I4901 Ventricular fibrillation: Secondary | ICD-10-CM | POA: Diagnosis present

## 2023-02-24 DIAGNOSIS — Z833 Family history of diabetes mellitus: Secondary | ICD-10-CM | POA: Diagnosis not present

## 2023-02-24 DIAGNOSIS — Z66 Do not resuscitate: Secondary | ICD-10-CM | POA: Diagnosis not present

## 2023-02-24 DIAGNOSIS — I462 Cardiac arrest due to underlying cardiac condition: Secondary | ICD-10-CM | POA: Diagnosis present

## 2023-02-24 DIAGNOSIS — D684 Acquired coagulation factor deficiency: Secondary | ICD-10-CM | POA: Diagnosis not present

## 2023-02-24 DIAGNOSIS — R578 Other shock: Secondary | ICD-10-CM | POA: Diagnosis present

## 2023-02-24 DIAGNOSIS — K922 Gastrointestinal hemorrhage, unspecified: Secondary | ICD-10-CM | POA: Diagnosis present

## 2023-02-24 DIAGNOSIS — I4891 Unspecified atrial fibrillation: Secondary | ICD-10-CM | POA: Diagnosis not present

## 2023-02-24 DIAGNOSIS — I469 Cardiac arrest, cause unspecified: Secondary | ICD-10-CM | POA: Diagnosis present

## 2023-02-24 DIAGNOSIS — N1831 Chronic kidney disease, stage 3a: Secondary | ICD-10-CM | POA: Diagnosis present

## 2023-02-24 DIAGNOSIS — K7200 Acute and subacute hepatic failure without coma: Secondary | ICD-10-CM | POA: Diagnosis not present

## 2023-02-24 DIAGNOSIS — Z8249 Family history of ischemic heart disease and other diseases of the circulatory system: Secondary | ICD-10-CM

## 2023-02-24 DIAGNOSIS — N179 Acute kidney failure, unspecified: Secondary | ICD-10-CM | POA: Diagnosis present

## 2023-02-24 DIAGNOSIS — Z7984 Long term (current) use of oral hypoglycemic drugs: Secondary | ICD-10-CM

## 2023-02-24 DIAGNOSIS — E871 Hypo-osmolality and hyponatremia: Secondary | ICD-10-CM | POA: Diagnosis not present

## 2023-02-24 DIAGNOSIS — I499 Cardiac arrhythmia, unspecified: Secondary | ICD-10-CM | POA: Diagnosis not present

## 2023-02-24 DIAGNOSIS — Z79899 Other long term (current) drug therapy: Secondary | ICD-10-CM

## 2023-02-24 DIAGNOSIS — R579 Shock, unspecified: Secondary | ICD-10-CM | POA: Diagnosis not present

## 2023-02-24 DIAGNOSIS — R55 Syncope and collapse: Secondary | ICD-10-CM | POA: Diagnosis not present

## 2023-02-24 DIAGNOSIS — C787 Secondary malignant neoplasm of liver and intrahepatic bile duct: Secondary | ICD-10-CM | POA: Diagnosis present

## 2023-02-24 DIAGNOSIS — Z8546 Personal history of malignant neoplasm of prostate: Secondary | ICD-10-CM | POA: Diagnosis not present

## 2023-02-24 DIAGNOSIS — E875 Hyperkalemia: Secondary | ICD-10-CM | POA: Diagnosis not present

## 2023-02-24 DIAGNOSIS — D62 Acute posthemorrhagic anemia: Secondary | ICD-10-CM | POA: Diagnosis present

## 2023-02-24 DIAGNOSIS — E8721 Acute metabolic acidosis: Secondary | ICD-10-CM | POA: Diagnosis present

## 2023-02-24 DIAGNOSIS — D6959 Other secondary thrombocytopenia: Secondary | ICD-10-CM | POA: Diagnosis present

## 2023-02-24 DIAGNOSIS — C7951 Secondary malignant neoplasm of bone: Secondary | ICD-10-CM | POA: Diagnosis not present

## 2023-02-24 DIAGNOSIS — C61 Malignant neoplasm of prostate: Secondary | ICD-10-CM | POA: Diagnosis not present

## 2023-02-24 DIAGNOSIS — N189 Chronic kidney disease, unspecified: Secondary | ICD-10-CM | POA: Diagnosis not present

## 2023-02-24 DIAGNOSIS — R918 Other nonspecific abnormal finding of lung field: Secondary | ICD-10-CM | POA: Diagnosis not present

## 2023-02-24 LAB — CBC WITH DIFFERENTIAL/PLATELET
Abs Immature Granulocytes: 3.3 10*3/uL — ABNORMAL HIGH (ref 0.00–0.07)
Basophils Absolute: 0.1 10*3/uL (ref 0.0–0.1)
Basophils Relative: 1 %
Eosinophils Absolute: 0 10*3/uL (ref 0.0–0.5)
Eosinophils Relative: 0 %
HCT: 29.3 % — ABNORMAL LOW (ref 39.0–52.0)
Hemoglobin: 7.5 g/dL — ABNORMAL LOW (ref 13.0–17.0)
Immature Granulocytes: 20 %
Lymphocytes Relative: 15 %
Lymphs Abs: 2.4 10*3/uL (ref 0.7–4.0)
MCH: 27.6 pg (ref 26.0–34.0)
MCHC: 25.6 g/dL — ABNORMAL LOW (ref 30.0–36.0)
MCV: 107.7 fL — ABNORMAL HIGH (ref 80.0–100.0)
Monocytes Absolute: 0.7 10*3/uL (ref 0.1–1.0)
Monocytes Relative: 5 %
Neutro Abs: 9.8 10*3/uL — ABNORMAL HIGH (ref 1.7–7.7)
Neutrophils Relative %: 59 %
Platelets: 86 10*3/uL — ABNORMAL LOW (ref 150–400)
RBC: 2.72 MIL/uL — ABNORMAL LOW (ref 4.22–5.81)
RDW: 20 % — ABNORMAL HIGH (ref 11.5–15.5)
Smear Review: DECREASED
WBC: 16.4 10*3/uL — ABNORMAL HIGH (ref 4.0–10.5)
nRBC: 4.3 % — ABNORMAL HIGH (ref 0.0–0.2)

## 2023-02-24 LAB — POCT I-STAT 7, (LYTES, BLD GAS, ICA,H+H)
Acid-base deficit: 28 mmol/L — ABNORMAL HIGH (ref 0.0–2.0)
Bicarbonate: 5.5 mmol/L — ABNORMAL LOW (ref 20.0–28.0)
Calcium, Ion: 1.01 mmol/L — ABNORMAL LOW (ref 1.15–1.40)
HCT: 24 % — ABNORMAL LOW (ref 39.0–52.0)
Hemoglobin: 8.2 g/dL — ABNORMAL LOW (ref 13.0–17.0)
O2 Saturation: 90 %
Patient temperature: 93.2
Potassium: 6.8 mmol/L (ref 3.5–5.1)
Sodium: 120 mmol/L — ABNORMAL LOW (ref 135–145)
TCO2: 7 mmol/L — ABNORMAL LOW (ref 22–32)
pCO2 arterial: 32.1 mm[Hg] (ref 32–48)
pH, Arterial: 6.814 — CL (ref 7.35–7.45)
pO2, Arterial: 91 mm[Hg] (ref 83–108)

## 2023-02-24 LAB — COMPREHENSIVE METABOLIC PANEL
ALT: 253 U/L — ABNORMAL HIGH (ref 0–44)
AST: 610 U/L — ABNORMAL HIGH (ref 15–41)
Albumin: 1.5 g/dL — ABNORMAL LOW (ref 3.5–5.0)
Alkaline Phosphatase: 4072 U/L — ABNORMAL HIGH (ref 38–126)
BUN: 54 mg/dL — ABNORMAL HIGH (ref 8–23)
CO2: <7 mmol/L — ABNORMAL LOW (ref 22–32)
Calcium: 12.3 mg/dL — ABNORMAL HIGH (ref 8.9–10.3)
Chloride: 87 mmol/L — ABNORMAL LOW (ref 98–111)
Creatinine, Ser: 5.4 mg/dL — ABNORMAL HIGH (ref 0.61–1.24)
GFR, Estimated: 11 mL/min — ABNORMAL LOW (ref >60)
Glucose, Bld: 466 mg/dL — ABNORMAL HIGH (ref 70–99)
Potassium: 7.3 mmol/L (ref 3.5–5.1)
Sodium: 123 mmol/L — ABNORMAL LOW (ref 135–145)
Total Bilirubin: 1.3 mg/dL — ABNORMAL HIGH (ref 0.3–1.2)
Total Protein: 4.7 g/dL — ABNORMAL LOW (ref 6.5–8.1)

## 2023-02-24 LAB — CBG MONITORING, ED
Glucose-Capillary: 121 mg/dL — ABNORMAL HIGH (ref 70–99)
Glucose-Capillary: 131 mg/dL — ABNORMAL HIGH (ref 70–99)
Glucose-Capillary: 235 mg/dL — ABNORMAL HIGH (ref 70–99)
Glucose-Capillary: 42 mg/dL — CL (ref 70–99)
Glucose-Capillary: 214 mg/dL — ABNORMAL HIGH (ref 70–99)

## 2023-02-24 LAB — I-STAT CHEM 8, ED
BUN: 63 mg/dL — ABNORMAL HIGH (ref 8–23)
Calcium, Ion: 1.45 mmol/L — ABNORMAL HIGH (ref 1.15–1.40)
Chloride: 95 mmol/L — ABNORMAL LOW (ref 98–111)
Creatinine, Ser: 5.5 mg/dL — ABNORMAL HIGH (ref 0.61–1.24)
Glucose, Bld: 419 mg/dL — ABNORMAL HIGH (ref 70–99)
HCT: 25 % — ABNORMAL LOW (ref 39.0–52.0)
Hemoglobin: 8.5 g/dL — ABNORMAL LOW (ref 13.0–17.0)
Potassium: 7 mmol/L (ref 3.5–5.1)
Sodium: 116 mmol/L — CL (ref 135–145)
TCO2: 7 mmol/L — ABNORMAL LOW (ref 22–32)

## 2023-02-24 LAB — TYPE AND SCREEN
ABO/RH(D): A POS
Antibody Screen: NEGATIVE

## 2023-02-24 LAB — I-STAT CG4 LACTIC ACID, ED: Lactic Acid, Venous: >15 mmol/L (ref 0.5–1.9)

## 2023-02-24 LAB — PROTIME-INR
INR: 3 — ABNORMAL HIGH (ref 0.8–1.2)
Prothrombin Time: 31 s — ABNORMAL HIGH (ref 11.4–15.2)

## 2023-02-24 LAB — TROPONIN I (HIGH SENSITIVITY): Troponin I (High Sensitivity): 36 ng/L — ABNORMAL HIGH (ref <18)

## 2023-02-24 MED ORDER — PANTOPRAZOLE SODIUM 40 MG IV SOLR: 40.0000 mg | Freq: Two times a day (BID) | INTRAVENOUS | Status: DC

## 2023-02-24 MED ORDER — ACETAMINOPHEN 325 MG PO TABS: 650.0000 mg | ORAL_TABLET | ORAL | Status: DC

## 2023-02-24 MED ORDER — CALCIUM CHLORIDE 10 % IV SOLN
INTRAVENOUS | Status: AC | PRN
  Administered 2023-02-24: 1 g via INTRAVENOUS

## 2023-02-24 MED ORDER — FAMOTIDINE 20 MG PO TABS: 20.0000 mg | ORAL_TABLET | Freq: Every day | ORAL | Status: DC

## 2023-02-24 MED ORDER — INSULIN ASPART 100 UNIT/ML IV SOLN
5.0000 [IU] | Freq: Once | INTRAVENOUS | Status: AC
  Administered 2023-02-24: 5 [IU] via INTRAVENOUS

## 2023-02-24 MED ORDER — NOREPINEPHRINE 4 MG/250ML-% IV SOLN
0.0000 ug/min | INTRAVENOUS | Status: DC
  Administered 2023-02-24 (×2): 40 ug/min via INTRAVENOUS
  Filled 2023-02-24 (×6): qty 250

## 2023-02-24 MED ORDER — NOREPINEPHRINE 4 MG/250ML-% IV SOLN
2.0000 ug/min | INTRAVENOUS | Status: DC
  Administered 2023-02-24: 2 ug/min via INTRAVENOUS

## 2023-02-24 MED ORDER — ACETAMINOPHEN 160 MG/5ML PO SOLN: 650.0000 mg | ORAL | Status: DC

## 2023-02-24 MED ORDER — POLYETHYLENE GLYCOL 3350 17 G PO PACK: 17.0000 g | PACK | Freq: Every day | ORAL | Status: DC

## 2023-02-24 MED ORDER — FAMOTIDINE 20 MG PO TABS: 20.0000 mg | ORAL_TABLET | Freq: Two times a day (BID) | ORAL | Status: DC

## 2023-02-24 MED ORDER — ONDANSETRON HCL 4 MG/2ML IJ SOLN: 4.0000 mg | Freq: Four times a day (QID) | INTRAMUSCULAR | Status: DC | PRN

## 2023-02-24 MED ORDER — MAGNESIUM SULFATE 2 GM/50ML IV SOLN: 2.0000 g | Freq: Once | INTRAVENOUS | Status: DC | PRN

## 2023-02-24 MED ORDER — SODIUM CHLORIDE 0.9 % IV SOLN: 3.0000 g | Freq: Two times a day (BID) | INTRAVENOUS | Status: DC

## 2023-02-24 MED ORDER — POLYETHYLENE GLYCOL 3350 17 G PO PACK: 17.0000 g | PACK | Freq: Every day | ORAL | Status: DC | PRN

## 2023-02-24 MED ORDER — ACETAMINOPHEN 160 MG/5ML PO SOLN: 650.0000 mg | ORAL | Status: DC | PRN

## 2023-02-24 MED ORDER — ACETAMINOPHEN 650 MG RE SUPP: 650.0000 mg | RECTAL | Status: DC

## 2023-02-24 MED ORDER — DEXTROSE 50 % IV SOLN
1.0000 | Freq: Once | INTRAVENOUS | Status: AC
  Administered 2023-02-24: 50 mL via INTRAVENOUS
  Filled 2023-02-24: qty 50

## 2023-02-24 MED ORDER — SODIUM BICARBONATE 8.4 % IV SOLN
INTRAVENOUS | Status: AC | PRN
Start: 2023-02-24 — End: 2023-02-24
  Administered 2023-02-24: 50 meq via INTRAVENOUS

## 2023-02-24 MED ORDER — BUSPIRONE HCL 10 MG PO TABS: 30.0000 mg | ORAL_TABLET | Freq: Three times a day (TID) | ORAL | Status: DC | PRN

## 2023-02-24 MED ORDER — DOCUSATE SODIUM 50 MG/5ML PO LIQD: 100.0000 mg | Freq: Two times a day (BID) | ORAL | Status: DC

## 2023-02-24 MED ORDER — CALCIUM GLUCONATE 10 % IV SOLN
1.0000 g | Freq: Once | INTRAVENOUS | Status: AC
  Administered 2023-02-24: 1 g via INTRAVENOUS
  Filled 2023-02-24: qty 10

## 2023-02-24 MED ORDER — DEXTROSE 50 % IV SOLN
INTRAVENOUS | Status: AC | PRN
  Administered 2023-02-24: 1 via INTRAVENOUS

## 2023-02-24 MED ORDER — FENTANYL 2500MCG IN NS 250ML (10MCG/ML) PREMIX INFUSION
25.0000 ug/h | INTRAVENOUS | Status: DC
  Filled 2023-02-24: qty 250

## 2023-02-24 MED ORDER — NOREPINEPHRINE 4 MG/250ML-% IV SOLN: 2.0000 ug/min | INTRAVENOUS | Status: DC

## 2023-02-24 MED ORDER — FENTANYL CITRATE PF 50 MCG/ML IJ SOSY: 25.0000 ug | PREFILLED_SYRINGE | Freq: Once | INTRAMUSCULAR | Status: DC

## 2023-02-24 MED ORDER — DEXMEDETOMIDINE HCL IN NACL 400 MCG/100ML IV SOLN: 0.0000 ug/kg/h | INTRAVENOUS | Status: DC

## 2023-02-24 MED ORDER — FENTANYL 2500MCG IN NS 250ML (10MCG/ML) PREMIX INFUSION
25.0000 ug/h | INTRAVENOUS | Status: DC
  Administered 2023-02-24: 25 ug/h via INTRAVENOUS

## 2023-02-24 MED ORDER — EPINEPHRINE HCL 5 MG/250ML IV SOLN IN NS
0.5000 ug/min | INTRAVENOUS | Status: DC
  Administered 2023-02-24: 40 ug/min via INTRAVENOUS
  Administered 2023-02-24: 20 ug/min via INTRAVENOUS
  Administered 2023-02-24: 40 ug/min via INTRAVENOUS
  Administered 2023-02-24: 30 ug/min via INTRAVENOUS
  Filled 2023-02-24 (×4): qty 250

## 2023-02-24 MED ORDER — VASOPRESSIN 20 UNITS/100 ML INFUSION FOR SHOCK
0.0000 [IU]/min | INTRAVENOUS | Status: DC
  Administered 2023-02-24 (×2): 0.03 [IU]/min via INTRAVENOUS
  Filled 2023-02-24 (×3): qty 100

## 2023-02-24 MED ORDER — SODIUM CHLORIDE 0.9 % IV SOLN: 250.0000 mL | INTRAVENOUS | Status: DC

## 2023-02-24 MED ORDER — PIPERACILLIN-TAZOBACTAM 3.375 G IVPB 30 MIN
3.3750 g | Freq: Once | INTRAVENOUS | Status: DC
  Filled 2023-02-24: qty 50

## 2023-02-24 MED ORDER — ACETAMINOPHEN 650 MG RE SUPP: 650.0000 mg | RECTAL | Status: DC | PRN

## 2023-02-24 MED ORDER — INSULIN ASPART 100 UNIT/ML IJ SOLN: 0.0000 [IU] | INTRAMUSCULAR | Status: DC

## 2023-02-24 MED ORDER — EPINEPHRINE 1 MG/10ML IJ SOSY
PREFILLED_SYRINGE | INTRAMUSCULAR | Status: AC | PRN
Start: 2023-02-24 — End: 2023-02-24
  Administered 2023-02-24 (×2): 1 mg via INTRAVENOUS

## 2023-02-24 MED ORDER — CALCIUM GLUCONATE-NACL 2-0.675 GM/100ML-% IV SOLN
2.0000 g | Freq: Once | INTRAVENOUS | Status: AC
  Administered 2023-02-24: 2000 mg via INTRAVENOUS
  Filled 2023-02-24: qty 100

## 2023-02-24 MED ORDER — ACETAMINOPHEN 325 MG PO TABS: 650.0000 mg | ORAL_TABLET | ORAL | Status: DC | PRN

## 2023-02-24 MED ORDER — VANCOMYCIN HCL 2000 MG/400ML IV SOLN
2000.0000 mg | Freq: Once | INTRAVENOUS | Status: DC
  Filled 2023-02-24: qty 400

## 2023-02-24 MED ORDER — SODIUM BICARBONATE 8.4 % IV SOLN
INTRAVENOUS | Status: DC
  Filled 2023-02-24: qty 150
  Filled 2023-02-24: qty 1000

## 2023-02-24 MED ORDER — FENTANYL BOLUS VIA INFUSION: 25.0000 ug | INTRAVENOUS | Status: DC | PRN

## 2023-02-24 MED ORDER — SODIUM BICARBONATE 8.4 % IV SOLN: 100.0000 meq | Freq: Once | INTRAVENOUS | Status: DC

## 2023-02-24 MED ORDER — SODIUM BICARBONATE 8.4 % IV SOLN
INTRAVENOUS | Status: AC
  Filled 2023-02-24: qty 50

## 2023-02-24 MED ORDER — PROPOFOL 1000 MG/100ML IV EMUL: 0.0000 ug/kg/min | INTRAVENOUS | Status: DC

## 2023-02-24 MED ORDER — SODIUM BICARBONATE 8.4 % IV SOLN
INTRAVENOUS | Status: AC | PRN
  Administered 2023-02-24: 50 meq via INTRAVENOUS

## 2023-02-24 MED ORDER — DEXMEDETOMIDINE HCL IN NACL 400 MCG/100ML IV SOLN
0.0000 ug/kg/h | INTRAVENOUS | Status: DC
  Administered 2023-02-24: 0.2 ug/kg/h via INTRAVENOUS
  Filled 2023-02-24: qty 100

## 2023-02-24 MED ORDER — DOCUSATE SODIUM 50 MG/5ML PO LIQD: 100.0000 mg | Freq: Two times a day (BID) | ORAL | Status: DC | PRN

## 2023-02-24 MED ORDER — SODIUM BICARBONATE 8.4 % IV SOLN
50.0000 meq | Freq: Once | INTRAVENOUS | Status: AC
  Administered 2023-02-24: 50 meq via INTRAVENOUS

## 2023-02-25 ENCOUNTER — Inpatient Hospital Stay: Payer: PPO

## 2023-02-25 ENCOUNTER — Inpatient Hospital Stay: Payer: PPO | Admitting: Oncology

## 2023-02-26 NOTE — Progress Notes (Signed)
Pharmacy Antibiotic Note  Wesley White is a 72 y.o. male for which pharmacy has been consulted for ampicillin-sulbactam dosing for  asp pna .  Patient with a history of prostate cancer with mets. Patient presenting as a post-CPR patient. Patient has subsequently coded in the ED with regain ROSC.  Pt is intubated on epinephrine, norepinephrine, and vasopressin. Vancomycin and zosyn initially ordered in the ED but CCM changing to Unasyn prior to receiving those agents.  Estimated Creatinine Clearance: 13.8 mL/min (A) (by C-G formula based on SCr of 5.5 mg/dL (H)).  WBC 16.4; LA >15; T 93.1; HR 28; RR 23  Plan: Unasyn 3g q12h Monitor WBC, fever, renal function, cultures De-escalate when able  Height: 5\' 10"  (177.8 cm) Weight: 90.7 kg (200 lb) IBW/kg (Calculated) : 73  Temp (24hrs), Avg:93 F (33.9 C), Min:92.8 F (33.8 C), Max:93.1 F (33.9 C)  Recent Labs  Lab February 27, 2023 1056 02-27-23 1057 Feb 27, 2023 1101  WBC  --   --  16.4*  CREATININE 5.50*  --   --   LATICACIDVEN  --  >15.0*  --     Estimated Creatinine Clearance: 13.8 mL/min (A) (by C-G formula based on SCr of 5.5 mg/dL (H)).    No Known Allergies  Microbiology results: Pending  Thank you for allowing pharmacy to be a part of this patient's care.  Delmar Landau, PharmD, BCPS 2023/02/27 11:54 AM ED Clinical Pharmacist -  [phone]

## 2023-02-26 NOTE — ED Notes (Addendum)
Patient arrived by [facility] following witnessed arrest by EMS at home. Initially called for sob, patient found with agonal respirations at 6 per minute.  EMS reports that patient has metastatic liver cancer-full code per ems Patient received the following pta: Defib x 1 Epi x 3 Narcan 2mg  Epi drip at Intubation 7.0 ETT IO Left tib  45" CBG reported 71 Patient received 20 minutes of CPR prior to pulse return.   On arrival patient with pulse and BP 50/18 and on Epi drip at CBG repeated and 42  at 1045-treated with D50

## 2023-02-26 NOTE — Progress Notes (Signed)
2023/03/21 1935  Spiritual Encounters  Type of Visit Follow up  Care provided to: [facility] partners present during encounter Nurse  Referral source Nurse (RN/NT/LPN)  Reason for visit End-of-life  OnCall Visit Yes  Spiritual Framework  Presenting Themes Impactful experiences and emotions;Community and relationships;Significant life change  Patient Stress Factors Health changes  Family Stress Factors Loss  Interventions  Spiritual Care Interventions Made Established relationship of care and support;Compassionate presence;Reflective listening;Narrative/life review;Encouragement  Intervention Outcomes  Outcomes Connection to spiritual care;Awareness of support  Spiritual Care Plan  Spiritual Care Issues Still Outstanding No further spiritual care needs at this time (see row info)   Chaplain was paged by RN. Chaplain sat with family as they shared stories about the patient. Once the patient's son joined the rest of the family chaplain allowed the family their privacy. Family did not request the chaplain to stay and expressed they were fine.   Arlyce Dice, Chaplain Resident [phone]

## 2023-02-26 NOTE — Procedures (Signed)
Arterial Catheter Insertion Procedure Note  Clarion Mooneyhan  [id number]  09/22/1950  Date:03-22-2023  Time:3:27 PM    Provider Performing: Cheri Fowler    Procedure: Insertion of Arterial Line (64332) with US guidance (95188)   Indication(s) Blood pressure monitoring and/or need for frequent ABGs  Consent Unable to obtain consent due to emergent nature of procedure.  Anesthesia None   Time Out Verified patient identification, verified procedure, site/side was marked, verified correct patient position, special equipment/implants available, medications/allergies/relevant history reviewed, required imaging and test results available.   Sterile Technique Maximal sterile technique including full sterile barrier drape, hand hygiene, sterile gown, sterile gloves, mask, hair covering, sterile ultrasound probe cover (if used).   Procedure Description Area of catheter insertion was cleaned with chlorhexidine and draped in sterile fashion. With real-time ultrasound guidance an arterial catheter was placed into the right  Axillary  artery.  Appropriate arterial tracings confirmed on monitor.     Complications/Tolerance None; patient tolerated the procedure well.   EBL Minimal   Specimen(s) None

## 2023-02-26 NOTE — Progress Notes (Signed)
Family is waiting for patients son to get into town before extubating and turning off vaso active drips.  We are treating patient as comfort care with no elevation of care until son arrives and then we will start fentanyl gtt and cut off pressors as well as a one way extubation.  Daughter and son in law are both CRNAs at [employer] and are aware of the situation and are just letting me know what they need. They are both at bedside. Patient appears to be comfortable at this time with fentanyl gtt.  They said they would be ok to start it if he appeared uncomfortable. Comfort cart is at bedside and chaplain is checking in on them as well.

## 2023-02-26 NOTE — ED Notes (Signed)
CCM at bedside 

## 2023-02-26 NOTE — Progress Notes (Signed)
IV therapy received 2 consults, back to back,  to place an USG IV for vasopressors; upon arrival to the room, multiple staff at bedside, along with family;  RN stated she "placed an USGPIV already; we're good."  Will complete this call from IV Therapy consult list.

## 2023-02-26 NOTE — H&P (Signed)
NAME:  Wesley White, MRN:  [id number], DOB:  1951/05/24, LOS: 0 ADMISSION DATE:  03-03-23, CONSULTATION DATE: 2023-03-03 REFERRING MD: Dr. 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  He presented to [facility] emergency department on 2023/03/03 in the setting of cardiac arrest.  Recent medical course significant for reinitiation of chemotherapy for rapid progression of prostate cancer.  Given struggling with nausea and vomiting and very poor p.o. intake.  In the early a.m. hours of 03-Mar-2023 he was feeling very poor until and family felt that his body was shutting down requesting to go to the emergency department.  Upon EMS arrival the patient was unresponsive with respiratory rate of 8.  He was noted to be in ventricular fibrillation once pads were applied and he was defibrillated.  Subsequent rhythm was PEA and ACLS was undergone for approximately 20 minutes prior to ROSC.  He was intubated in the field.  Upon arrival to the emergency department he remained pulseless and CPR was administered for an additional 2 rounds.  After ROSC he remained in refractory shock despite multiple vasopressors.  PCCM was asked to evaluate for ICU admission.  Pertinent  Medical History   has a past medical history of Cancer of spine (HCC), Chronic kidney disease, Prediabetes, Prostate cancer (HCC), and Sleep apnea.   Significant Hospital Events: Including procedures, antibiotic start and stop dates in addition to other pertinent events   03/03/23 admit for cardiac arrest  Interim History / Subjective:    Objective   Blood pressure 120/84, pulse (!) 28, temperature (!) 93.1 F (33.9 C), resp. rate (!) 23, height 5\' 10"  (1.778 m), weight 90.7 kg, SpO2 100%.    Vent Mode: PRVC FiO2 (%):  [100 %] 100 % Set Rate:  [20 bmp] 20 bmp Vt Set:  [580 mL] 580 mL PEEP:   [5 cmH20] 5 cmH20 Plateau Pressure:  [18 cmH20] 18 cmH20  No intake or output data in the 24 hours ending 2023/03/03 1200 Filed Weights   2023-03-03 1116  Weight: 90.7 kg    Examination: General: Overweight critically ill-appearing male on the mechanical ventilator HENT: Normocephalic, atraumatic, evidence of coffee-ground emesis on his face.  Pupils 6 mm and minimally responsive to light. Lungs: Coarse rhonchi throughout Cardiovascular: Tachycardic, regular, no murmurs rubs or gallops Abdomen: Protuberant, soft, hypoactive Extremities: No acute deformity, pale, no significant edema. Neuro: [facility] Problem list     Assessment & Plan:   VF arrest Upper GI bleed Refractory Shock: suspect a combination of hypovolemic and cardiogenic shock states Metastatic prostate cancer with rapid recent progression - Family opting for inpatient comfort care once more family members have arrived.  - Will admit to ICU. - Continue fentanyl infusion for comfort. - Continue epi, norepi, vasopressin. Family will inform us when they are ready to fully transition. No escalation.  - Comfort cart to bedside - Unrestricted visitation  See Dr. Merrily Pew attestation for GOC discussion details.   Best Practice (right click and "Reselect all SmartList Selections" daily)   Diet/type: NPO DVT prophylaxis: not indicated GI prophylaxis: PPI Lines: Arterial Line Foley:  N/A Code Status:  DNR Last date of multidisciplinary goals of care discussion [ ]   Labs   CBC: Recent Labs  Lab 03/03/2023 1056 03/03/23 1101  WBC  --  16.4*  NEUTROABS  --  9.8*  HGB 8.5* 7.5*  HCT 25.0* 29.3*  MCV  --  107.7*  PLT  --  86*    Basic Metabolic Panel: Recent Labs  Lab 03/10/23 1056  NA 116*  K 7.0*  CL 95*  GLUCOSE 419*  BUN 63*  CREATININE 5.50*   GFR: Estimated Creatinine Clearance: 13.8 mL/min (A) (by C-G formula based on SCr of 5.5 mg/dL (H)). Recent Labs  Lab 03/10/23 1057 2023/03/10 1101   WBC  --  16.4*  LATICACIDVEN >15.0*  --     Liver Function Tests: No results for input(s): "AST", "ALT", "ALKPHOS", "BILITOT", "PROT", "ALBUMIN" in the last 168 hours. No results for input(s): "LIPASE", "AMYLASE" in the last 168 hours. No results for input(s): "AMMONIA" in the last 168 hours.  ABG    Component Value Date/Time   TCO2 7 (L) 2023/03/10 1056     Coagulation Profile: Recent Labs  Lab 03-10-2023 1101  INR 3.0*    Cardiac Enzymes: No results for input(s): "CKTOTAL", "CKMB", "CKMBINDEX", "TROPONINI" in the last 168 hours.  HbA1C: Hemoglobin A1C  Date/Time Value Ref Range Status  05/11/2022 08:50 AM 7.7 (A) 4.0 - 5.6 % Final  10/27/2021 02:19 PM 8.1 (A) 4.0 - 5.6 % Final   Hgb A1c MFr Bld  Date/Time Value Ref Range Status  10/17/2018 11:39 AM 11.3 (H) 4.8 - 5.6 % Final    Comment:             Prediabetes: 5.7 - 6.4          Diabetes: >6.4          Glycemic control for adults with diabetes: <7.0     CBG: Recent Labs  Lab 03/10/2023 1044 10-Mar-2023 1055 03-10-23 1107 03-10-2023 1120 03/10/23 1134  GLUCAP 42* 131* 121* 235* 214*    Review of Systems:    has a past medical history of Cancer of spine (HCC), Chronic kidney disease, Prediabetes, Prostate cancer (HCC), and Sleep apnea.   Past Medical History:  He,  has a past medical history of Cancer of spine (HCC), Chronic kidney disease, Prediabetes, Prostate cancer (HCC), and Sleep apnea.   Surgical History:   Past Surgical History:  Procedure Laterality Date   HERNIA REPAIR     1983 baland    LITHOTRIPSY     2011 by tannenbaum   LYMPHADENECTOMY Bilateral 03/05/2016   Procedure: PELVIC LYMPHADENECTOMY;  Surgeon: Heloise Purpura, MD;  Location: [facility];  Service: Urology;  Laterality: Bilateral;   PROSTATE BIOPSY     ROBOT ASSISTED LAPAROSCOPIC RADICAL PROSTATECTOMY N/A 03/05/2016   Procedure: XI ROBOTIC ASSISTED LAPAROSCOPIC RADICAL PROSTATECTOMY LEVEL 2;  Surgeon: Heloise Purpura, MD;  Location: [facility];   Service: Urology;  Laterality: N/A;     Social History:   reports that he has never smoked. He has never used smokeless tobacco. He reports that he does not currently use alcohol. He reports that he does not use drugs.   Family History:  His family history includes Bipolar disorder in his daughter; Cancer (age of onset: 38) in his brother; Cancer (age of onset: 58) in his father; Diabetes in his mother; Hyperlipidemia in his mother; Hypertension in his mother. There is no history of Colon cancer, Esophageal cancer, Rectal cancer, or Stomach cancer.   Allergies No Known Allergies   Home Medications  Prior to Admission medications   Medication Sig Start Date End Date Taking? Authorizing Provider  Calcium Carbonate (CALCIUM 600 PO) Take 600 mg by mouth daily.    [provider]  ibuprofen (ADVIL) 600 MG tablet Take 600 mg by mouth every 6 (six) hours as needed.    [provider]  INVOKANA 300 MG TABS tablet TAKE 1 TABLET BY MOUTH EVERY DAY BEFORE BREAKFAST 09/10/22   Ronnald Nian, MD  lisinopril (ZESTRIL) 5 MG tablet TAKE 1 TABLET BY MOUTH EVERY DAY 09/10/22   Ronnald Nian, MD  loratadine (CLARITIN) 10 MG tablet Take 1 tablet (10 mg total) by mouth daily. Take for 5 days with each gscf injection 02/08/23   Ladene Artist, MD  metFORMIN (GLUCOPHAGE) 850 MG tablet TAKE 1 TABLET BY MOUTH TWICE A DAY WITH FOOD 11/24/22   Ronnald Nian, MD  ondansetron (ZOFRAN) 8 MG tablet Take 8 mg by mouth every 8 (eight) hours as needed for nausea or vomiting.    [provider]  OneTouch Delica Lancets 33G MISC See admin instructions. 11/05/18   [provider]  [facility] VERIO test strip USE AS DIRECTED AS NEEDED 01/22/20   Ronnald Nian, MD  oxyCODONE-acetaminophen (PERCOCET/ROXICET) 5-325 MG tablet Take 1-2 tablets by mouth every 4 (four) hours as needed for severe pain. 02/18/23   Rana Snare, NP  predniSONE (DELTASONE) 5 MG tablet Take 1 tablet (5 mg total) by  mouth 2 (two) times daily with a meal. Start day prior to 1st chemotherapy treatment and continue. Monitor blood sugars while on prednisone 01/30/23   Ladene Artist, MD  prochlorperazine (COMPAZINE) 10 MG tablet Take 1 tablet (10 mg total) by mouth every 6 (six) hours as needed. 01/30/23   Ladene Artist, MD  rosuvastatin (CRESTOR) 40 MG tablet TAKE 1 TABLET BY MOUTH EVERY DAY 01/29/23   Ronnald Nian, MD     Critical care time: 56 minutes     Joneen Roach, AGACNP-BC [facility]  See Amion for personal pager PCCM on call pager [phone] until 7pm. Please call Elink 7p-7a. [phone]  03-22-23 12:26 PM

## 2023-02-26 NOTE — Code Documentation (Signed)
Cardiac activity per [place]

## 2023-02-26 NOTE — ED Notes (Signed)
Family updated as to patient's status and at bedside  

## 2023-02-26 NOTE — Progress Notes (Signed)
ED Pharmacy Antibiotic Sign Off An antibiotic consult was received from an ED provider for vancomycin and zosyn per pharmacy dosing for sepsis. A chart review was completed to assess appropriateness.  The following one time order(s) were placed per pharmacy consult:  zosyn 3.375g x 1 dose vancomycin 2000 mg x 1 dose  Further antibiotic and/or antibiotic pharmacy consults should be ordered by the admitting provider if indicated.   Thank you for allowing pharmacy to be a part of this patient's care.   Delmar Landau, PharmD, BCPS 2023/03/04 11:21 AM ED Clinical Pharmacist -  [phone]

## 2023-02-26 NOTE — Procedures (Signed)
Intraosseous Needle Insertion Procedure Note    Date:03-04-2023  Time:3:30 PM   Provider Performing:Devonne Kitchen   Procedure: Insertion Intraosseous (14782)  Indication(s) Medication administration  Consent Unable to obtain consent due to emergent nature of procedure.  Anesthesia Topical only with 1% lidocaine   Timeout Verified patient identification, verified procedure, site/side was marked, verified correct patient position, special equipment/implants available, medications/allergies/relevant history reviewed, required imaging and test results available.  Procedure Description Area of needle insertion was cleaned with chlorhexidine. Intraosseous needle was placed into the right tibia. Bone marrow was aspirated and site easily flushed. The needle was secured in place and dressing applied.  Complications/Tolerance None; patient tolerated the procedure well.  EBL Minimal

## 2023-02-26 NOTE — ED Provider Notes (Signed)
[place] EMERGENCY DEPARTMENT AT [facility] Provider Note   CSN: [id number] Arrival date & time: 2023/03/10  1043     History  No chief complaint on file.   Icker Grigas is a 72 y.o. male.  This is a 72 year old male who is here today after a witnessed cardiac arrest.  History is obtained via EMS, and then family who I spoke to in person.  EMS reports they were called because the patient had been lethargic, had not been eating or drinking very well the last couple of days.  He has been receiving chemotherapy for metastatic prostate cancer.  Patient is a full code.  Patient had a witnessed arrest in front of EMS.  They bagged the patient, began compressions.  They identified what appeared to be V-fib on their monitor and cardioverted the patient.  At that point the had PEA.  They resumed compressions.  After standard ACLS for 20 minutes, they had ROSC.  Patient was brought into our emergency department.  Family tells me that for the last couple of days, the patient has not been eating or drinking very well.  He has been vomiting most things that he eats.        Home Medications Prior to Admission medications   Medication Sig Start Date End Date Taking? Authorizing Provider  Calcium Carbonate (CALCIUM 600 PO) Take 600 mg by mouth daily.    [provider]  ibuprofen (ADVIL) 600 MG tablet Take 600 mg by mouth every 6 (six) hours as needed.    [provider]  INVOKANA 300 MG TABS tablet TAKE 1 TABLET BY MOUTH EVERY DAY BEFORE BREAKFAST 09/10/22   Ronnald Nian, MD  lisinopril (ZESTRIL) 5 MG tablet TAKE 1 TABLET BY MOUTH EVERY DAY 09/10/22   Ronnald Nian, MD  loratadine (CLARITIN) 10 MG tablet Take 1 tablet (10 mg total) by mouth daily. Take for 5 days with each gscf injection 02/08/23   Ladene Artist, MD  metFORMIN (GLUCOPHAGE) 850 MG tablet TAKE 1 TABLET BY MOUTH TWICE A DAY WITH FOOD 11/24/22   Ronnald Nian, MD  ondansetron (ZOFRAN) 8 MG tablet  Take 8 mg by mouth every 8 (eight) hours as needed for nausea or vomiting.    [provider]  OneTouch Delica Lancets 33G MISC See admin instructions. 11/05/18   [provider]  [facility] VERIO test strip USE AS DIRECTED AS NEEDED 01/22/20   Ronnald Nian, MD  oxyCODONE-acetaminophen (PERCOCET/ROXICET) 5-325 MG tablet Take 1-2 tablets by mouth every 4 (four) hours as needed for severe pain. 02/18/23   Rana Snare, NP  predniSONE (DELTASONE) 5 MG tablet Take 1 tablet (5 mg total) by mouth 2 (two) times daily with a meal. Start day prior to 1st chemotherapy treatment and continue. Monitor blood sugars while on prednisone 01/30/23   Ladene Artist, MD  prochlorperazine (COMPAZINE) 10 MG tablet Take 1 tablet (10 mg total) by mouth every 6 (six) hours as needed. 01/30/23   Ladene Artist, MD  rosuvastatin (CRESTOR) 40 MG tablet TAKE 1 TABLET BY MOUTH EVERY DAY 01/29/23   Ronnald Nian, MD      Allergies    Patient has no known allergies.    Review of Systems   Review of Systems  Physical Exam Updated Vital Signs BP 120/84   Pulse (!) 28   Temp (!) 93.1 F (33.9 C)   Resp (!) 23   Ht 5\' 10"  (1.778 m)  Wt 90.7 kg   SpO2 100%   BMI 28.70 kg/m  Physical Exam Vitals reviewed.  Constitutional:      Comments: Patient intubated, breathing spontaneously but not responsive  HENT:     Head: Normocephalic and atraumatic.     Mouth/Throat:     Mouth: Mucous membranes are dry.  Eyes:     Comments: Pupils are nonreactive  Cardiovascular:     Comments: Tachycardic Pulmonary:     Comments: Intubated, breath sounds equal bilaterally Genitourinary:    Comments: No distended bladder on ultrasound    ED Results / Procedures / Treatments   Labs (all labs ordered are listed, but only abnormal results are displayed) Labs Reviewed  CBC WITH DIFFERENTIAL/PLATELET - Abnormal; Notable for the following components:      Result Value   WBC 16.4 (*)    RBC 2.72 (*)     Hemoglobin 7.5 (*)    HCT 29.3 (*)    MCV 107.7 (*)    MCHC 25.6 (*)    RDW 20.0 (*)    Platelets 86 (*)    nRBC 4.3 (*)    Neutro Abs 9.8 (*)    Abs Immature Granulocytes 3.30 (*)    All other components within normal limits  PROTIME-INR - Abnormal; Notable for the following components:   Prothrombin Time 31.0 (*)    INR 3.0 (*)    All other components within normal limits  CBG MONITORING, ED - Abnormal; Notable for the following components:   Glucose-Capillary 42 (*)    All other components within normal limits  I-STAT CHEM 8, ED - Abnormal; Notable for the following components:   Sodium 116 (*)    Potassium 7.0 (*)    Chloride 95 (*)    BUN 63 (*)    Creatinine, Ser 5.50 (*)    Glucose, Bld 419 (*)    Calcium, Ion 1.45 (*)    TCO2 7 (*)    Hemoglobin 8.5 (*)    HCT 25.0 (*)    All other components within normal limits  I-STAT CG4 LACTIC ACID, ED - Abnormal; Notable for the following components:   Lactic Acid, Venous >15.0 (*)    All other components within normal limits  CBG MONITORING, ED - Abnormal; Notable for the following components:   Glucose-Capillary 131 (*)    All other components within normal limits  CBG MONITORING, ED - Abnormal; Notable for the following components:   Glucose-Capillary 121 (*)    All other components within normal limits  CBG MONITORING, ED - Abnormal; Notable for the following components:   Glucose-Capillary 235 (*)    All other components within normal limits  COMPREHENSIVE METABOLIC PANEL  HEMOGLOBIN A1C  BLOOD GAS, ARTERIAL  I-STAT CHEM 8, ED  I-STAT CG4 LACTIC ACID, ED  TYPE AND SCREEN  TROPONIN I (HIGH SENSITIVITY)    EKG None  Radiology No results found.  Procedures .Critical Care  Performed by: Arletha Pili, DO Authorized by: Arletha Pili, DO   Critical care provider statement:    Critical care time (minutes):  62   Critical care was necessary to treat or prevent imminent or life-threatening  deterioration of the following conditions:  Cardiac failure, shock, sepsis, respiratory failure, CNS failure or compromise and metabolic crisis   Critical care was time spent personally by me on the following activities:  Blood draw for specimens, development of treatment plan with patient or surrogate, discussions with consultants, evaluation of patient's response to treatment,  examination of patient, gastric intubation, obtaining history from patient or surrogate, ordering and performing treatments and interventions, ordering and review of laboratory studies, ordering and review of radiographic studies, pulse oximetry, re-evaluation of patient's condition and ventilator management     Medications Ordered in ED Medications  EPINEPHrine (ADRENALIN) 5 mg in NS 250 mL (0.02 mg/mL) premix infusion (15 mcg/min Intravenous Rate/Dose Change 20-Mar-2023 1113)  vancomycin (VANCOREADY) IVPB 2000 mg/400 mL (has no administration in time range)  piperacillin-tazobactam (ZOSYN) IVPB 3.375 g (has no administration in time range)  vasopressin (PITRESSIN) 20 Units in 100 mL (0.2 unit/mL) infusion-*FOR SHOCK* (0.03 Units/min Intravenous New Bag/Given 03-20-2023 1130)  fentaNYL in NS (41mcg/ml) infusion-PREMIX (has no administration in time range)  fentaNYL (SUBLIMAZE) bolus via infusion 25-100 mcg (has no administration in time range)  pantoprazole (PROTONIX) injection 40 mg (has no administration in time range)  0.9 %  sodium chloride infusion (has no administration in time range)  docusate sodium (COLACE) capsule 100 mg (has no administration in time range)  polyethylene glycol (MIRALAX / GLYCOLAX) packet 17 g (has no administration in time range)  famotidine (PEPCID) tablet 20 mg (has no administration in time range)  ondansetron (ZOFRAN) injection 4 mg (has no administration in time range)  insulin aspart (novoLOG) injection 0-15 Units (has no administration in time range)  propofol (DIPRIVAN) 1000  MG/100ML infusion (has no administration in time range)  docusate (COLACE) 50 MG/5ML liquid 100 mg (has no administration in time range)  polyethylene glycol (MIRALAX / GLYCOLAX) packet 17 g (has no administration in time range)  ondansetron (ZOFRAN) injection 4 mg (has no administration in time range)  acetaminophen (TYLENOL) tablet 650 mg (has no administration in time range)    Or  acetaminophen (TYLENOL) 160 MG/5ML solution 650 mg (has no administration in time range)    Or  acetaminophen (TYLENOL) suppository 650 mg (has no administration in time range)  acetaminophen (TYLENOL) tablet 650 mg (has no administration in time range)    Or  acetaminophen (TYLENOL) 160 MG/5ML solution 650 mg (has no administration in time range)    Or  acetaminophen (TYLENOL) suppository 650 mg (has no administration in time range)  busPIRone (BUSPAR) tablet 30 mg (has no administration in time range)    Or  busPIRone (BUSPAR) tablet 30 mg (has no administration in time range)  magnesium sulfate IVPB 2 g 50 mL (has no administration in time range)  0.9 %  sodium chloride infusion (has no administration in time range)  fentaNYL (SUBLIMAZE) injection 25 mcg (has no administration in time range)  fentaNYL in NS (84mcg/ml) infusion-PREMIX (has no administration in time range)  fentaNYL (SUBLIMAZE) bolus via infusion 25-100 mcg (has no administration in time range)  norepinephrine (LEVOPHED) 4mg  in (0.016 mg/mL) premix infusion (has no administration in time range)  sodium bicarbonate injection 100 mEq (has no administration in time range)  calcium gluconate 2 g/ 100 mL sodium chloride IVPB (has no administration in time range)  sodium bicarbonate 150 mEq in dextrose 5 % 1,150 mL infusion (has no administration in time range)  EPINEPHrine (ADRENALIN) 1 MG/10ML injection (1 mg Intravenous Given Mar 20, 2023 1049)  sodium bicarbonate injection (50 mEq Intravenous Given 03-20-2023 1046)  calcium  chloride injection (1 g Intravenous Given 03/20/23 1047)  dextrose 50 % solution (1 ampule Intravenous Given 03-20-23 1047)  sodium bicarbonate injection (50 mEq Intravenous Given 2023/03/20 1100)  calcium gluconate inj 10% (1 g) URGENT USE ONLY! (1 g Intravenous Given  March 13, 2023 1106)  insulin aspart (novoLOG) injection 5 Units (5 Units Intravenous Given 03-13-2023 1108)    And  dextrose 50 % solution 50 mL (50 mLs Intravenous Given 13-Mar-2023 1107)  sodium bicarbonate injection 50 mEq (50 mEq Intravenous Given Mar 13, 2023 1142)    ED Course/ Medical Decision Making/ A&P                                 Medical Decision Making 72 year old male presents emergency department as postcardiac arrest and ROSC.  Differential diagnoses include hyperkalemia, acidemia, V-fib arrest, cardiac arrest.  Plan-in the time between the patient offloading from the truck, and on 2-hour bed, the patient lost pulses.  Chest compressions were began immediately using [facility] device.  Gave the patient epinephrine, calcium and bicarbonate.  This was repeated x 2, at which point patient had return of spontaneous circulation visualized on femoral pulse via Doppler and bedside cardiac ultrasound.  Patient was maintained on epinephrine drip.  Patient's potassium came back at 7.1 on i-STAT.  He did have peaked T waves on his EKG, so treated for hyperkalemia.  Prior to the i-STAT potassium coming back, I did reach out to cardiology given the report of V-fib arrest, however once patient's potassium is peaked T waves were identified, I did not believe that cardiac catheterization was indicated for this particular patient.  Patient briefly had a period of atrial fibrillation on the monitor, and on her first EKG, and I considered cardioverting the patient, however it appear to be transient, my suspicion is that it was related to The high potassium, and the epinephrine that the patient had been receiving.  Atrial fibrillation was transient, and  cardioversion was not necessary.  Patient's blood pressure continue to trend down, received repeated bicarbonate, started on vasopressin and Levophed.  I spoke with the patient's wife and daughter in our consult room.  They did not wish for repeated cardioversion or compressions if the patient were to lose spontaneous circulation, however they wanted to continue with care.  Ordered 3 L of IV fluids for the patient.  He may have sepsis, unclear.  Broad-spectrum antibiotics ordered.  I spoke with renal, and they are agreeable with seeing if the patient could be stabilized for CRRT.  Spoke with the intensivist, they came down to evaluate the patient we discussed his care at bedside.  Patient admitted to the medical ICU.  Amount and/or Complexity of Data Reviewed Labs: ordered. Radiology: ordered.  Risk OTC drugs. Prescription drug management.           Final Clinical Impression(s) / ED Diagnoses Final diagnoses:  Hyperkalemia  Cardiac arrest [facility])    Rx / DC Orders ED Discharge Orders     None         Anders Simmonds T, DO 03-13-23 1151

## 2023-02-26 NOTE — Progress Notes (Signed)
2023-03-04 1317  Spiritual Encounters  Type of Visit Initial  Care provided to: Family  Conversation partners present during encounter Physician;Nurse  Referral source Physician  Reason for visit End-of-life  OnCall Visit Yes  Spiritual Framework  Presenting Themes Impactful experiences and emotions  Patient Stress Factors Health changes  Family Stress Factors Loss  Interventions  Spiritual Care Interventions Made Established relationship of care and support;Compassionate presence;Reflective listening;Encouragement  Intervention Outcomes  Outcomes Connection to spiritual care;Awareness of support;Reduced anxiety;Reduced fear;Reduced isolation  Spiritual Care Plan  Spiritual Care Issues Still Outstanding Chaplain will continue to follow  Follow up plan  Chaplain Tiburcio Pea will follow and will refer to oncoming chaplain at 2030   Chaplain responded to a page from the ED for family support. Patient came in after having CPR. Chaplain met the doctor in the consultation room as we was speaking to family. Chaplain provided emotional support for the family. Chaplain assisted family to find each other, gather and join the patient in 2H. Family present were spouse Trudie Buckler, daughter Adelina Mings, daughter Budd Palmer and SIL Apolinar Junes. More family is flying in from [place].  Chaplain will follow as needed/requested.   Arlyce Dice, Chaplain Resident [phone]

## 2023-02-26 NOTE — Progress Notes (Signed)
Spoke with RN and plan of care has changed, no need for EEG. Also sent epic message to Dr. Merrily Pew who verified that the EEG is no longer needed at this time.

## 2023-02-26 NOTE — IPAL (Signed)
Interdisciplinary Goals of Care Family Meeting   Date carried out: Feb 25, 2023  Location of the meeting: Bedside  Member's involved: Physician, Bedside Registered Nurse, and Family Member or next of kin  Durable Power of Attorney or acting medical decision maker: Jarold Song    Discussion: We discussed goals of care for Wesley White .   The Clinical status was relayed to patient's wife and daughter at bedside in detail.   Updated and notified of patients medical condition.  Explained that patient suffered cardiac arrest and it took 20 minutes to achieve ROSC, currently he is in multisystem organ failure and requiring multiple vasopressor support to keep his blood pressure is stabilized.  He is a struggling to breathe despite on maximum ventilatory support. Patient family stated they would not want aggressive care at this point, they would like to keep patient comfort focused and after all family member come in say goodbye, they would like to proceed with palliative extubation    Code status: Full DNR  Disposition: In-patient comfort care    Family are satisfied with Plan of action and management. All questions answered   Cheri Fowler MD [facility] See Amion for pager If no response to pager, please call [phone] until 7pm After 7pm, Please call E-link [phone]

## 2023-02-26 NOTE — Procedures (Signed)
Extubation Procedure Note  Patient Details:   Name: Wesley White DOB: 05-31-1950 MRN: [id number]   Airway Documentation:    Vent end date: 2023-02-27 Vent end time: 2025   Pt. Extubated to comfort care   Helane Gunther 02-27-23, 8:26 PM

## 2023-02-26 DEATH — deceased

## 2023-02-27 ENCOUNTER — Inpatient Hospital Stay: Payer: PPO

## 2023-02-27 ENCOUNTER — Inpatient Hospital Stay: Payer: PPO | Attending: Oncology

## 2023-02-27 ENCOUNTER — Inpatient Hospital Stay: Payer: PPO | Admitting: Nurse Practitioner

## 2023-02-28 ENCOUNTER — Inpatient Hospital Stay: Payer: PPO

## 2023-03-22 ENCOUNTER — Ambulatory Visit: Payer: PPO | Admitting: Cardiology

## 2023-03-29 NOTE — Death Summary Note (Signed)
DEATH SUMMARY   Patient Details  Name: Wesley White MRN: [id number] DOB: [birth year]  Admission/Discharge Information   Admit Date:  2023-02-27  Date of Death: Date of Death: Feb 27, 2023  Time of Death: Time of Death: 04/19/2026  Length of Stay: 1  Referring Physician: Ronnald Nian, MD   Reason(s) for Hospitalization  Status post V-fib cardiac arrest followed by PEA Upper GI bleeding Acute blood loss anemia Thrombocytopenia of critical illness Severe metabolic acidosis Acute kidney injury on CKD stage IIIa Hyperkalemia/hyponatremia/hypercalcemia Shock liver with coagulopathy Refractory distributive shock likely in the setting of severe acidosis 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 year old male with past medical history as below, which is significant for prostate cancer with mets to bone and liver, CKD, and sleep apnea. He presented to [facility] emergency department on Feb 27, 2023 in the setting of cardiac arrest. Recent medical course significant for reinitiation of chemotherapy for rapid progression of prostate cancer. Given struggling with nausea and vomiting and very poor p.o. intake. In the early a.m. hours of Feb 27, 2023 he was feeling very poor until and family felt that his body was shutting down requesting to go to the emergency department. Upon EMS arrival the patient was unresponsive with respiratory rate of 8. He was noted to be in ventricular fibrillation once pads were applied and he was defibrillated. Subsequent rhythm was PEA  and ACLS was undergone for approximately 20 minutes prior to ROSC. He was intubated in the field. Upon arrival to the emergency department he remained pulseless and CPR was administered for an additional 2 rounds. After ROSC he remained in refractory shock despite multiple vasopressors. PCCM was asked to evaluate for ICU admission.  Patient was admitted to ICU, he developed severe refractory metabolic acidosis with pH less than 7, he was continued on multiple vasopressor support including norepinephrine, epinephrine and vasopressin.  Considering patient was in multisystem organ failure including renal failure, respiratory failure with a GI bleeding and refractory acidosis patient's family decided to keep him comfortable not to do aggressive major, comfort care were restarted, patient passed on 02/27/2023 at 8:27 PM.  Patient's family was at bedside   Pertinent Labs and Studies  Significant Diagnostic Studies DG Chest Portable 1 View  Result Date: Feb 27, 2023 CLINICAL DATA:  Intubation. History of chronic kidney disease and prostate cancer. EXAM: PORTABLE CHEST 1 VIEW COMPARISON:  Radiographs 12/19/2022 and 01/12/2016. Chest CT 10/18/2021. PET-CT 09/13/2022. FINDINGS: 1125 hours. Tip of the endotracheal tube is near the carina and should be pulled back approximately 4 cm for more optimal positioning. Enteric tube projects below the diaphragm, side hole near the gastroesophageal junction. The heart size and mediastinal contours are stable. There are new bibasilar airspace opacities, greater on the right. No pleural effusion or pneumothorax. Known widespread osseous metastatic disease may be mildly progressive compared with prior chest radiographs. No acute osseous findings are evident. Telemetry leads overlie the chest. IMPRESSION: 1. Endotracheal tube tip is near the carina and should be pulled back approximately 4 cm for more optimal positioning. 2. Enteric tube side hole near the gastroesophageal junction.  This could be advanced  5 cm for more optimal positioning. 3. New bibasilar airspace opacities, greater on the right. Findings could reflect aspiration or pneumonia. 4. Osseous metastatic disease, possibly progressive. Electronically Signed   By: Carey Bullocks M.D.   On: 03-26-2023 12:09    Microbiology No results found for this or any previous visit (from the past 240 hour(s)).  Lab Basic Metabolic Panel: Recent Labs  Lab 03/26/2023 1056 Mar 26, 2023 1101 03-26-2023 1237  NA 116* 123* 120*  K 7.0* 7.3* 6.8*  CL 95* 87*  --   CO2  --  <7*  --   GLUCOSE 419* 466*  --   BUN 63* 54*  --   CREATININE 5.50* 5.40*  --   CALCIUM  --  12.3*  --    Liver Function Tests: Recent Labs  Lab 03-26-23 1101  AST 610*  ALT 253*  ALKPHOS 4,072*  BILITOT 1.3*  PROT 4.7*  ALBUMIN 1.5*   No results for input(s): "LIPASE", "AMYLASE" in the last 168 hours. No results for input(s): "AMMONIA" in the last 168 hours. CBC: Recent Labs  Lab Mar 26, 2023 1056 03-26-2023 1101 2023/03/26 1237  WBC  --  16.4*  --   NEUTROABS  --  9.8*  --   HGB 8.5* 7.5* 8.2*  HCT 25.0* 29.3* 24.0*  MCV  --  107.7*  --   PLT  --  86*  --    Cardiac Enzymes: No results for input(s): "CKTOTAL", "CKMB", "CKMBINDEX", "TROPONINI" in the last 168 hours. Sepsis Labs: Recent Labs  Lab 26-Mar-2023 1057 March 26, 2023 1101  WBC  --  16.4*  LATICACIDVEN >15.0*  --     Procedures/Operations     Wesley White 02/26/2023, 10:56 AM

## 2023-04-04 ENCOUNTER — Other Ambulatory Visit (HOSPITAL_COMMUNITY): Payer: PPO

## 2023-05-16 ENCOUNTER — Ambulatory Visit: Payer: PPO | Admitting: Family Medicine

## 2023-05-16 ENCOUNTER — Other Ambulatory Visit (HOSPITAL_COMMUNITY): Payer: PPO
